# Patient Record
Sex: Male | Born: 1935 | Race: White | Hispanic: No
Health system: Midwestern US, Community
[De-identification: ages and names within clinical notes are randomized; demographics above are authoritative.]

## PROBLEM LIST (undated history)

## (undated) DIAGNOSIS — N401 Enlarged prostate with lower urinary tract symptoms: Secondary | ICD-10-CM

## (undated) DIAGNOSIS — N390 Urinary tract infection, site not specified: Secondary | ICD-10-CM

## (undated) DIAGNOSIS — W19XXXD Unspecified fall, subsequent encounter: Secondary | ICD-10-CM

## (undated) DIAGNOSIS — I4891 Unspecified atrial fibrillation: Secondary | ICD-10-CM

## (undated) DIAGNOSIS — Z950 Presence of cardiac pacemaker: Secondary | ICD-10-CM

## (undated) HISTORY — DX: Unspecified atrial fibrillation: I48.91

## (undated) HISTORY — PX: PACEMAKER INSERTION: SHX728

## (undated) HISTORY — DX: Presence of cardiac pacemaker: Z95.0

---

## 2009-05-25 NOTE — ED Notes (Signed)
Skin tears to elbow cleansed and non adherant dressing applied with connform wrap

## 2009-05-25 NOTE — ED Notes (Signed)
Pt given discharge instructions. Verbalized understanding. Opportunity given for questions. Accompanied by family

## 2009-05-25 NOTE — ED Provider Notes (Signed)
Patient is a 76 y.o. male presenting with fall. The history is provided by the patient.   Fall  The accident occurred 1 to 2 hours ago. The fall occurred while walking (up stairs). He fell from a height of 3 - 5 ft. He landed on concrete. The volume of blood lost was minimal. The point of impact was the head and left elbow (back). The pain is at a severity of 1/10. The pain is mild. He was ambulatory at the scene. There was no entrapment after the fall. There was no drug use involved in the accident. There was alcohol use involved in the accident. Associated symptoms include laceration. Pertinent negatives include no visual change, no fever, no numbness, no abdominal pain, no bowel incontinence, no nausea, no vomiting, no hematuria, no extremity weakness, no hearing loss, no loss of consciousness and no tingling. The risk factors include being elderly.  He has tried nothing for the symptoms. The patient's last tetanus shot was 5 to 10 years ago.       No past medical history on file.     No past surgical history on file.      No family history on file.     History   Social History   ??? Marital Status: Single     Spouse Name: N/A     Number of Children: N/A   ??? Years of Education: N/A   Occupational History   ??? Not on file.   Social History Main Topics   ??? Smoking status: Former Smoker   ??? Smokeless tobacco: Not on file   ??? Alcohol Use: 1.5 oz/week     3 Glasses of wine per week   ??? Drug Use:    ??? Sexually Active:    Other Topics Concern   ??? Not on file   Social History Narrative   ??? No narrative on file           ALLERGIES: Review of patient's allergies indicates no known allergies.      Review of Systems   Constitutional: Negative for fever, chills, diaphoresis, appetite change and fatigue.   Eyes: Negative.    Respiratory: Negative.    Gastrointestinal: Negative for nausea, vomiting and abdominal pain.   Genitourinary: Negative.  Negative for hematuria.   Musculoskeletal: Negative for extremity weakness.    Neurological: Negative for tingling, loss of consciousness and numbness.       Filed Vitals:    05/25/09 1917 05/25/09 1918   BP: 150/96    Pulse: 101    Temp: 96.4 ??F (35.8 ??C)    Resp: 18    Height:  5\' 8"  (1.727 m)   Weight:  170 lb (77.111 kg)              Physical Exam   Nursing note and vitals reviewed.  Constitutional: He is oriented to person, place, and time. He appears well-developed and well-nourished.   HENT:   Head: Normocephalic.        Right Ear: External ear normal.   Left Ear: External ear normal.   Mouth/Throat: Oropharynx is clear and moist. No oropharyngeal exudate.   Eyes: Conjunctivae and extraocular motions are normal. Pupils are equal, round, and reactive to light. Right eye exhibits no discharge. Left eye exhibits no discharge. No scleral icterus.   Neck: Normal range of motion. Neck supple.   Cardiovascular: Normal rate, regular rhythm, normal heart sounds and intact distal pulses.    No murmur heard.  Pulmonary/Chest:  Effort normal and breath sounds normal. No respiratory distress. He has no wheezes. He has no rales.   Abdominal: Soft. He exhibits no distension. No tenderness.   Musculoskeletal: Normal range of motion. He exhibits no edema and no tenderness.   Neurological: He is alert and oriented to person, place, and time. No cranial nerve deficit. Coordination normal.   Skin: Skin is warm. Laceration noted. No rash noted. No erythema.        Skin tears to left elbow noted   Psychiatric: He has a normal mood and affect. His behavior is normal. Judgment and thought content normal.        MDM    Procedures

## 2009-05-25 NOTE — ED Notes (Signed)
Pt fell while going up steps and slidded down the steps, abrasions to left knee, skin tear to left elbow and abrasions to back.  Pt had a couple glasses of wine tonight.

## 2009-05-25 NOTE — ED Provider Notes (Signed)
I have personally seen and evaluated patient. I find the patient's history and physical exam are consistent with the PA's NP documentation. I agree with the care provided, treatments rendered, disposition and follow up plan.

## 2009-05-26 LAB — METABOLIC PANEL, COMPREHENSIVE
A-G Ratio: 0.9 — ABNORMAL LOW (ref 1.1–2.2)
ALT (SGPT): 78 U/L (ref 12–78)
AST (SGOT): 94 U/L — ABNORMAL HIGH (ref 15–37)
Albumin: 3.7 g/dL (ref 3.5–5.0)
Alk. phosphatase: 122 U/L (ref 50–136)
Anion gap: 11 mmol/L (ref 5–15)
BUN/Creatinine ratio: 8 — ABNORMAL LOW (ref 12–20)
BUN: 7 MG/DL (ref 6–20)
Bilirubin, total: 0.6 MG/DL (ref 0.2–1.0)
CO2: 27 MMOL/L (ref 21–32)
Calcium: 8.6 MG/DL (ref 8.5–10.1)
Chloride: 103 MMOL/L (ref 97–108)
Creatinine: 0.9 MG/DL (ref 0.6–1.3)
GFR est AA: >60 mL/min/{1.73_m2} (ref >60)
GFR est non-AA: >60 mL/min/{1.73_m2} (ref >60)
Globulin: 3.9 g/dL (ref 2.0–4.0)
Glucose: 91 MG/DL (ref 65–100)
Potassium: 4.7 MMOL/L (ref 3.5–5.1)
Protein, total: 7.6 g/dL (ref 6.4–8.2)
Sodium: 141 MMOL/L (ref 136–145)

## 2009-05-26 LAB — CBC WITH AUTOMATED DIFF
ABS. BASOPHILS: 0 10*3/uL (ref 0.0–0.1)
ABS. EOSINOPHILS: 0.1 10*3/uL (ref 0.0–0.4)
ABS. LYMPHOCYTES: 2.3 10*3/uL (ref 0.8–3.5)
ABS. MONOCYTES: 0.4 10*3/uL (ref 0.0–1.0)
ABS. NEUTROPHILS: 3.3 10*3/uL (ref 1.8–8.0)
BASOPHILS: 0 % (ref 0–1)
EOSINOPHILS: 1 % (ref 0–7)
HCT: 44.6 % (ref 36.6–50.3)
HGB: 15.9 g/dL (ref 12.1–17.0)
LYMPHOCYTES: 38 % (ref 12–49)
MCH: 38.9 PG — ABNORMAL HIGH (ref 26.0–34.0)
MCHC: 35.7 g/dL (ref 30.0–36.5)
MCV: 109 FL — ABNORMAL HIGH (ref 80.0–99.0)
MONOCYTES: 7 % (ref 5–13)
NEUTROPHILS: 54 % (ref 32–75)
PLATELET: 188 10*3/uL (ref 150–400)
RBC: 4.09 M/uL — ABNORMAL LOW (ref 4.10–5.70)
RDW: 14.1 % (ref 11.5–14.5)
WBC: 6.1 10*3/uL (ref 4.1–11.1)

## 2009-05-26 LAB — ETHYL ALCOHOL: ALCOHOL(ETHYL),SERUM: 186 MG/DL — ABNORMAL HIGH (ref <10)

## 2009-05-26 MED ADMIN — tetanus-diphtheria toxids-td 5-2 Lf unit/0.5 mL injection 0.5 mL: INTRAMUSCULAR | NDC 49281029110

## 2009-05-26 MED FILL — DECAVAC (PF) 5 LF UNIT-2 LF UNIT/0.5 ML INTRAMUSCULAR SYRINGE: 5-2 Lf unit/0.5 mL | INTRAMUSCULAR | Qty: 0.5

## 2011-11-23 NOTE — ED Notes (Signed)
Patient continues to have diarrhea, pt still wants to be discharged home per PA, had the choice to be admitted.

## 2011-11-23 NOTE — ED Provider Notes (Signed)
HPI Comments: Patient presents by EMS after fall. Per family patient was found on floor after falling 20 hours prior. Patient reports he tripped in his bathroom. Patient denies any pain. PAtient with history of drinking alcohol daily. Last drink Wednesday.     Patient is a 78 y.o. male presenting with fatigue and fall. The history is provided by the patient.   Fatigue  This is a recurrent problem. The problem has not changed since onset.There was no focality noted. Primary symptoms include loss of balance.Pertinent negatives include no focal weakness, no loss of sensation, no slurred speech, no speech difficulty, no memory loss, no movement disorder, no agitation, no visual change, no auditory change, no mental status change, no unresponsiveness and no disorientation. There has been no fever. Pertinent negatives include no shortness of breath, no chest pain, no vomiting, no altered mental status, no confusion, no headaches, no choking, no nausea, no bowel incontinence and no bladder incontinence. Associated medical issues do not include trauma, mood changes, bleeding disorder, seizures, dementia, CVA or clotting disorder.   Fall  The accident occurred 12 to 24 hours ago. The fall occurred while standing. He fell from a height of ground level. There was no blood loss. He was not ambulatory at the scene. There was no entrapment after the fall. There was no drug use involved in the accident. There was alcohol use involved in the accident. Pertinent negatives include no visual change, no fever, no numbness, no bowel incontinence, no nausea, no vomiting, no hematuria, no headaches, no extremity weakness, no loss of consciousness, no tingling and no laceration. The risk factors include recurrent falls and being elderly.  He has tried nothing for the symptoms.        No past medical history on file.     No past surgical history on file.      No family history on file.     History     Social History   ??? Marital Status:  SINGLE     Spouse Name: N/A     Number of Children: N/A   ??? Years of Education: N/A     Occupational History   ??? Not on file.     Social History Main Topics   ??? Smoking status: Former Smoker   ??? Smokeless tobacco: Not on file   ??? Alcohol Use: 0.0 oz/week      Comment: about pint of vodka per day grandson   ??? Drug Use: No   ??? Sexually Active: Not on file     Other Topics Concern   ??? Not on file     Social History Narrative   ??? No narrative on file                  ALLERGIES: Review of patient's allergies indicates no known allergies.      Review of Systems   Constitutional: Positive for fatigue. Negative for fever.   HENT: Negative.    Eyes: Negative.    Respiratory: Negative.  Negative for choking and shortness of breath.    Cardiovascular: Negative.  Negative for chest pain.   Gastrointestinal: Negative.  Negative for nausea, vomiting and bowel incontinence.   Endocrine: Negative.    Genitourinary: Negative.  Negative for bladder incontinence and hematuria.   Musculoskeletal: Negative.  Negative for extremity weakness.   Skin: Negative.    Allergic/Immunologic: Negative.    Neurological: Positive for loss of balance. Negative for tingling, focal weakness, loss of consciousness, speech difficulty,  numbness and headaches.   Hematological: Negative.    Psychiatric/Behavioral: Negative for memory loss, confusion, agitation and altered mental status. The patient is nervous/anxious.    All other systems reviewed and are negative.        There were no vitals filed for this visit.         Physical Exam   Nursing note and vitals reviewed.  Constitutional: He is oriented to person, place, and time. He appears well-developed and well-nourished.   HENT:   Head: Normocephalic and atraumatic.   Right Ear: External ear normal.   Left Ear: External ear normal.   Mouth/Throat: Oropharynx is clear and moist. No oropharyngeal exudate.   Eyes: Conjunctivae and EOM are normal. Pupils are equal, round, and reactive to light. Right eye  exhibits no discharge. Left eye exhibits no discharge. No scleral icterus.   Neck: Normal range of motion. Neck supple. No tracheal deviation present. No thyromegaly present.   Cardiovascular: Normal rate, regular rhythm, normal heart sounds and intact distal pulses.    No murmur heard.  Pulmonary/Chest: Effort normal and breath sounds normal. No respiratory distress. He has no wheezes. He has no rales.   Abdominal: Soft. Bowel sounds are normal. He exhibits no distension. There is no tenderness. There is no rebound and no guarding.   Musculoskeletal: Normal range of motion. He exhibits no edema and no tenderness.   Lymphadenopathy:     He has no cervical adenopathy.   Neurological: He is alert and oriented to person, place, and time. No cranial nerve deficit. Coordination normal.   Skin: Skin is warm. No laceration and no rash noted. No erythema.   Psychiatric: His behavior is normal. Judgment and thought content normal. His mood appears anxious. Cognition and memory are normal.        MDM    Procedures

## 2011-11-23 NOTE — ED Notes (Signed)
Patient to CT>

## 2011-11-23 NOTE — ED Notes (Signed)
Patient in wheelchair home with daughter. Patient given discharge instruction and verbalized understanding of teaching. Armband removed and shredded.

## 2011-11-23 NOTE — ED Provider Notes (Signed)
I have personally seen and evaluated patient. I find the patient's history and physical exam are consistent with the PA's NP documentation. I agree with the care provided, treatments rendered, disposition and follow up plan.

## 2011-11-23 NOTE — ED Notes (Signed)
Patient to receive IV fluids and then go home.

## 2011-11-23 NOTE — ED Notes (Signed)
Patient presents via EMS after ground level fall, on floor for about 20 - 23 hours.  Denies pain but family wants him checked out.  Has been gradually weaker for past few days.  Does not normally eat very well, does not do much cooking.  Grandson told EMS patient drinks about pint of vodka per day.  Last drink Wednesday per patient.  Patient has shakes.

## 2011-11-23 NOTE — ED Notes (Signed)
Seizure pads placed on bed.

## 2011-11-23 NOTE — ED Notes (Signed)
Patient attempting to use urinal at this time.

## 2011-11-24 LAB — URINALYSIS W/ REFLEX CULTURE
Bacteria: NEGATIVE /HPF
Glucose: NEGATIVE MG/DL
Ketone: 15 MG/DL — AB
Leukocyte Esterase: NEGATIVE
Nitrites: NEGATIVE
Specific gravity: 1.018 (ref 1.003–1.030)
Urobilinogen: 1 EU/DL (ref 0.2–1.0)
pH (UA): 5.5 (ref 5.0–8.0)

## 2011-11-24 LAB — CBC WITH AUTOMATED DIFF
ABS. BASOPHILS: 0 10*3/uL (ref 0.0–0.1)
ABS. EOSINOPHILS: 0 10*3/uL (ref 0.0–0.4)
ABS. LYMPHOCYTES: 1.3 10*3/uL (ref 0.8–3.5)
ABS. MONOCYTES: 0.7 10*3/uL (ref 0.0–1.0)
ABS. NEUTROPHILS: 6.5 10*3/uL (ref 1.8–8.0)
BASOPHILS: 0 % (ref 0–1)
EOSINOPHILS: 0 % (ref 0–7)
HCT: 38.7 % (ref 36.6–50.3)
HGB: 14 g/dL (ref 12.1–17.0)
LYMPHOCYTES: 15 % (ref 12–49)
MCH: 41.5 PG — ABNORMAL HIGH (ref 26.0–34.0)
MCHC: 36.2 g/dL (ref 30.0–36.5)
MCV: 114.8 FL — ABNORMAL HIGH (ref 80.0–99.0)
MONOCYTES: 8 % (ref 5–13)
NEUTROPHILS: 77 % — ABNORMAL HIGH (ref 32–75)
PLATELET: 168 10*3/uL (ref 150–400)
RBC: 3.37 M/uL — ABNORMAL LOW (ref 4.10–5.70)
RDW: 14.9 % — ABNORMAL HIGH (ref 11.5–14.5)
WBC COMMENTS: REACTIVE
WBC: 8.5 10*3/uL (ref 4.1–11.1)

## 2011-11-24 LAB — METABOLIC PANEL, COMPREHENSIVE
A-G Ratio: 1.1 (ref 1.1–2.2)
ALT (SGPT): 67 U/L (ref 12–78)
AST (SGOT): 106 U/L — ABNORMAL HIGH (ref 15–37)
Albumin: 3.8 g/dL (ref 3.5–5.0)
Alk. phosphatase: 131 U/L (ref 50–136)
Anion gap: 12 mmol/L (ref 5–15)
BUN/Creatinine ratio: 12 (ref 12–20)
BUN: 9 MG/DL (ref 6–20)
Bilirubin, total: 1.7 MG/DL — ABNORMAL HIGH (ref 0.2–1.0)
CO2: 28 MMOL/L (ref 21–32)
Calcium: 8.6 MG/DL (ref 8.5–10.1)
Chloride: 103 MMOL/L (ref 97–108)
Creatinine: 0.73 MG/DL (ref 0.45–1.15)
GFR est AA: >60 mL/min/{1.73_m2} (ref >60)
GFR est non-AA: >60 mL/min/{1.73_m2} (ref >60)
Globulin: 3.4 g/dL (ref 2.0–4.0)
Glucose: 97 MG/DL (ref 65–100)
Potassium: 4.4 MMOL/L (ref 3.5–5.1)
Protein, total: 7.2 g/dL (ref 6.4–8.2)
Sodium: 143 MMOL/L (ref 136–145)

## 2011-11-24 LAB — CK: CK: 104 U/L (ref 39–308)

## 2011-11-24 MED ADMIN — diphenoxylate-atropine (LOMOTIL) tablet 2 Tab: ORAL | @ 02:00:00 | NDC 00378041501

## 2011-11-24 MED ADMIN — sodium chloride 0.9 % bolus infusion 1,000 mL: INTRAVENOUS | @ 01:00:00 | NDC 00409798309

## 2011-11-24 MED ADMIN — sodium chloride (NS) flush 5-10 mL: INTRAVENOUS | @ 02:00:00 | NDC 87701099893

## 2011-11-24 MED ADMIN — LORazepam (ATIVAN) injection 1 mg: INTRAVENOUS | @ 01:00:00 | NDC 00641604401

## 2011-11-24 MED FILL — SODIUM CHLORIDE 0.9 % IV: INTRAVENOUS | Qty: 1000

## 2011-11-24 MED FILL — DIPHENOXYLATE-ATROPINE 2.5 MG-0.025 MG TAB: ORAL | Qty: 2

## 2011-11-24 MED FILL — LORAZEPAM 2 MG/ML IJ SOLN: 2 mg/mL | INTRAMUSCULAR | Qty: 1

## 2011-11-25 LAB — EKG, 12 LEAD, INITIAL
Atrial Rate: 82 {beats}/min
Calculated R Axis: -49 degrees
Calculated T Axis: 129 degrees
Q-T Interval: 418 ms
QRS Duration: 136 ms
QTC Calculation (Bezet): 528 ms
Ventricular Rate: 96 {beats}/min

## 2011-11-27 NOTE — Patient Instructions (Addendum)
If you drink alcohol, try to limit intake to no more than 1 beer (or one glass of wine or one shot of liquor) in any 24 hour period, and not daily.     obtain physical therapy    Talk to Vickie our nurse navigator to see what resources are available (they wish home health for physical therapy and want inpatient alcohol treatment)    See Dr. Guss Bunde for psychiatric evaluation    Take centrum silver vitamin daily    Take thiamine 100 mg daily    Labs today

## 2011-11-27 NOTE — Progress Notes (Signed)
Dylan Haney is a 78 y.o. male      Issues discussed today include:    New patient    Signs and symptoms:  Fall.  10/26  Duration:  [place] to the [place], stumbled and fell.  He had had 3-4 drinks (Vodka)Taken by EMS to ER when he couldn't get up  Context:  went to [facility] ER   Location:  Has had several falls over the years  Quality:  Walks with cane  Severity:  Drinks daily  Timing:  Fall risk  Modifying factors:  Wife died 5 years ago    He drinks ETOH daily    Both sons alcoholic and have been through detox    He does have hip OA    He lives alone.      Data reviewed or ordered today:  labs    Other problems include:  Patient Active Problem List   Diagnosis Code   ??? Falls E888.9   ??? ETOH abuse 305.00   ??? A-fib 427.31       Medications:  No current outpatient prescriptions on file.     No current facility-administered medications for this visit.       Allergies:  No Known Allergies    LMP:  No LMP for male patient.    History     Social History   ??? Marital Status: WIDOWED     Spouse Name: N/A     Number of Children: N/A   ??? Years of Education: N/A     Occupational History   ??? Not on file.     Social History Main Topics   ??? Smoking status: Former Smoker   ??? Smokeless tobacco: Not on file   ??? Alcohol Use: 0.0 oz/week      Comment: about pint of vodka per day grandson   ??? Drug Use: No   ??? Sexually Active: Not on file     Other Topics Concern   ??? Not on file     Social History Narrative   ??? No narrative on file       No family history on file.      Meaningful use:  done      ROS:  Headaches:  no  Chest Pain:  no  SOB:  no  Fevers:  no  Other significant ROS:  Deaf left ear    No LMP for male patient.    Physical Exam  BP 130/78   Pulse 98   Temp(Src) 97.4 ??F (36.3 ??C) (Oral)   Resp 20   Ht 5\' 8"  (1.727 m)   Wt 157 lb (71.215 kg)   BMI 23.88 kg/m2  BP Readings from Last 3 Encounters:   11/27/11 130/78   11/23/11 133/71   05/25/09 150/96     Constitutional:  Appears well,  No Acute Distress, Vitals noted  Psychiatric:   Affect  normal, Alert and Oriented to person/place/time    Eyes:   Pupils equally round and reactive, EOMI, conjunctiva clear, eyelids normal  ENT:   External ears and nose normal/lips, teeth=dentures OK/gums normal, TMs and Orophyarynx normal  Neck:   general inspection and Thyroid normal.  No abnormal cervical or supraclavicular nodes    Lungs:   clear to auscultation, good respiratory effort  Heart:   Ausculation normal.  Regular rhythm.  No cardiac murmurs.  No carotid bruits or palpable thrills  Chest wall normal    Extremities:   without edema, good peripheral pulses  Skin:  Warm to palpation, without rashes, bruising, or suspicious lesions           Assessment:    Patient Active Problem List   Diagnosis Code   ??? Falls E888.9   ??? ETOH abuse 305.00   ??? A-fib 427.31       Today's diagnoses are:  1. Falls     2. ETOH abuse  REFERRAL TO PSYCHIATRY   3. A-fib  REFERRAL TO CARDIOLOGY   4. Depression  REFERRAL TO PSYCHIATRY       Plan:  Orders Placed This Encounter   ??? REFERRAL TO CARDIOLOGY     Referral Priority:  Routine     Referral Type:  Consultation     Referral Reason:  Specialty Services Required     Referred to Provider:  Laddie Aquas, MD     Requested Specialty:  Cardiology     Number of Visits Requested:  1   ??? REFERRAL TO PSYCHIATRY     Referral Priority:  Routine     Referral Type:  Behavioral Health     Referral Reason:  Specialty Services Required     Referred to Provider:  Lawanna Kobus, MD     Requested Specialty:  Psychiatry     Number of Visits Requested:  1       See patient instructions  Patient Instructions   If you drink alcohol, try to limit intake to no more than 1 beer (or one glass of wine or one shot of liquor) in any 24 hour period, and not daily.     obtain physical therapy    Talk to Vickie our nurse navigator to see what resources are available (they wish home health for physical therapy and want inpatient alcohol treatment)    See Dr. Guss Bunde for psychiatric evaluation        MYCHART:  info given     refresh note:  done    AVS Printed:  done

## 2011-11-27 NOTE — Progress Notes (Signed)
Chief Complaint   Patient presents with   ??? New Patient     Patient declined influenza vaccine.     Reviewed record in preparation for visit and have obtained necessary documentation.

## 2011-11-28 LAB — VITAMIN D, 25 HYDROXY: VITAMIN D, 25-HYDROXY: 11.1 ng/mL — ABNORMAL LOW (ref 30.0–100.0)

## 2011-11-28 LAB — HEPATIC FUNCTION PANEL
ALT (SGPT): 43 IU/L (ref 0–44)
AST (SGOT): 67 IU/L — ABNORMAL HIGH (ref 0–40)
Albumin: 3.9 g/dL (ref 3.5–4.7)
Alk. phosphatase: 85 IU/L (ref 44–105)
Bilirubin, direct: 0.66 mg/dL — ABNORMAL HIGH (ref 0.00–0.40)
Bilirubin, total: 1.4 mg/dL — ABNORMAL HIGH (ref 0.0–1.2)
Protein, total: 6.6 g/dL (ref 6.0–8.5)

## 2011-11-28 LAB — VITAMIN B12 & FOLATE
Folate: 6.5 ng/mL (ref >3.0)
Vitamin B12: 312 pg/mL (ref 211–946)

## 2011-11-28 NOTE — Progress Notes (Signed)
Called to see if pt would meet criteria for [facility].  He said he does not drive anymore and does not get out of his home.  Also called [facility] psych to see if they do detox or alcohol rehab.  The do not do alcohol rehab but they can do detox if pt meets qualifications.  Was told to have pt go to ER and let them know what was needed and they would be evaluated and treated as needed.    Pt wanted me to talk to his daughter so I called her.  She said she wanted [facility] and she might take him to [facility] but she would think about it.

## 2011-11-28 NOTE — Progress Notes (Signed)
Quick Note:    Your vitamin D is VERY low.    Your liver is still irritated.    Please follow up to discuss.  ______

## 2011-11-29 NOTE — Progress Notes (Signed)
[facility] does not accept his insurance so they suggested [facility].  Referral request faxed to them.

## 2011-11-30 NOTE — Progress Notes (Signed)
Daughter called and said that Dr Bing Ree office will not accept her fathers insurance for psych so she is going to check a few other places.  She will call me if she has any luck.

## 2011-12-11 NOTE — Patient Instructions (Addendum)
Take Centrum silver vitamin daily    Obtain liver ultrasound    Please call [name] to help arrange and authorize your tests and/or referrals.  Her # is [phone]     Take vitamin D:  One pill daily for 3 days then one pill once a week for 12 weeks    Recheck labs in 3 months    Avoid alcohol    Flu shot soon

## 2011-12-11 NOTE — Progress Notes (Signed)
Dylan Haney is a 78 y.o.  male  Issues discussed today include:    Follow up of chronic conditions, see diagnoses    Vit D low, liver irritated    Now off ETOH since Halloween.  No DTs    Data reviewed or ordered today:  Needs further labs and abd [place]    Other problems include:  Patient Active Problem List   Diagnosis Code   ??? Falls E888.9   ??? ETOH abuse 305.00   ??? A-fib 427.31   ??? Elevated bilirubin 782.4   ??? Unspecified vitamin D deficiency 268.9   ??? Macrocytosis 289.89       Medications:  Current Outpatient Prescriptions   Medication Sig Dispense Refill   ??? aspirin 81 mg chewable tablet Take 1 Tab by mouth daily.  90 Tab  3   ??? thiamine (B-1) 100 mg tablet Take 1 Tab by mouth daily.  30 Tab  11     No current facility-administered medications for this visit.       Allergies:  No Known Allergies    LMP:  No LMP for male patient.    History     Social History   ??? Marital Status: WIDOWED     Spouse Name: N/A     Number of Children: N/A   ??? Years of Education: N/A     Occupational History   ??? Not on file.     Social History Main Topics   ??? Smoking status: Former Smoker   ??? Smokeless tobacco: Never Used   ??? Alcohol Use: 0.0 oz/week      Comment: about pint of vodka per day grandson   ??? Drug Use: No   ??? Sexually Active: Not on file     Other Topics Concern   ??? Not on file     Social History Narrative   ??? No narrative on file       No family history on file.    Meaningful use:  done      Physical Exam:  BP 132/78   Pulse 64   Temp(Src) 97 ??F (36.1 ??C) (Oral)   Resp 20   Ht 5\' 8"  (1.727 m)   Wt 150 lb 3.2 oz (68.13 kg)   BMI 22.84 kg/m2    BP Readings from Last 3 Encounters:   12/11/11 132/78   11/27/11 130/78   11/23/11 133/71         Assessment:    Patient Active Problem List   Diagnosis Code   ??? Falls E888.9   ??? ETOH abuse 305.00   ??? A-fib 427.31   ??? Elevated bilirubin 782.4   ??? Unspecified vitamin D deficiency 268.9   ??? Macrocytosis 289.89       Today's diagnoses are:  1. Unspecified vitamin D deficiency  VITAMIN  D, 25 HYDROXY   2. Transaminitis  [place] ABD COMP, HEPATITIS PANEL, ACUTE, CBC W/O DIFF, HEPATIC FUNCTION PANEL   3. Need for pneumococcal vaccination  PNEUMOCOCCAL POLYSACCHARIDE VACCINE, 23-VALENT, ADULT OR IMMUNOSUPPRESSED PT DOSE,       Plan:  Orders Placed This Encounter   ??? [place] ABD COMP     Standing Status: Future      Number of Occurrences:       Standing Expiration Date: 01/09/2013     Order Specific Question:  Reason for Exam     Answer:  transaminitis   ??? PNEUMOCOCCAL POLYSACCHARIDE VACCINE, 23-VALENT, ADULT OR IMMUNOSUPPRESSED PT DOSE,   ???  HEPATITIS PANEL, ACUTE     Standing Status: Future      Number of Occurrences:       Standing Expiration Date: 06/09/2012   ??? VITAMIN D, 25 HYDROXY     Standing Status: Future      Number of Occurrences:       Standing Expiration Date: 06/09/2012   ??? CBC W/O DIFF     Standing Status: Future      Number of Occurrences:       Standing Expiration Date: 06/09/2012   ??? HEPATIC FUNCTION PANEL     Standing Status: Future      Number of Occurrences:       Standing Expiration Date: 06/09/2012       See patient instructions  Patient Instructions   Take Centrum silver vitamin daily    Obtain liver ultrasound    Please call Dylan Haney to help arrange and authorize your tests and/or referrals.  Her # is [phone]     Take vitamin D:  One pill daily for 3 days then one pill once a week for 12 weeks    Recheck labs in 3 months    Avoid alcohol    Flu shot soon        MYCHART:  info given    refresh note:  done    AVS Printed:  done      The majority of today's visit was counseling or coordination of care 15 minutes.     Total face to face time:  15  Time spent counseling:  15  Subject:  See diagnoses.

## 2011-12-11 NOTE — Progress Notes (Signed)
Chief Complaint   Patient presents with   ??? Abnormal Lab Results     follow up on labs     Reviewed record in preparation for visit and have obtained necessary documentation.

## 2011-12-17 NOTE — Telephone Encounter (Signed)
PT called wanting to double check his appt for tomorrow, states it is at 9 am at [facility] for an ultrasound. Would like to be given a call back as soon as possible.    Thank You

## 2011-12-19 NOTE — Progress Notes (Signed)
Quick Note:    Your ultrasound shows fatty infiltration of the liver. Please follow up to discuss.  ______

## 2012-02-12 NOTE — Progress Notes (Signed)
Chief Complaint   Patient presents with   ??? Follow-up     falls, etoh     Says he hasn't had anything to drink in over a month.   Is not eating. He doesn't cook and can't drive to get food.     Reviewed record in preparation for visit and have obtained necessary documentation.

## 2012-02-12 NOTE — Addendum Note (Signed)
Addended by: Marciano Sequin C on: 02/12/2012 02:59 PM     Modules accepted: Orders

## 2012-02-12 NOTE — Patient Instructions (Addendum)
Instructions for patient:    In general, I advise patients to be as active as possible.  I believe exercise is the key to long life and good health.  The current recommendation is for individuals to exercise for 150 minutes each week (in other words 30 minutes 5 days a week).  Exercise should be vigorous enough to work up a sweat.  These activities include brisk walking, running, tennis, swimming, weight-lifting, etc.     I usually tell folks that work is work and exercise is exercise.  Each of these activities has a different goal.  Even though you may be active at work, it may not be aerobically adequate.  So build dedicated exercise time into your weekly routine.    For reliable diet information go to www.[employer].fi.   You may also wish to talk to the nutritionist at [facility]  [phone] or [phone]      In general, a healthful diet includes:  1. more fruits and vegetables (grown locally, organic if possible)  2. keep meat to one meal a day (not all three meals)  3. keep meat portion size to equal a deck of cards  4. eat more greens and beans and foods with natural color (like blueberries)  5. eat whole foods (think ???what would a cave-man eat????, certainly not hotdogs!)  6. drink plenty of water  7. consider sodas and sugary juices as treats (think ???liquid candy???)    I advise against alcohol consumption.    Avoid tobacco products.    I routinely suggest a complete physical exam once each year (your birth month).    I suggest flu shot but we are out.  I suggest that you go to a local pharmacy to obtain one this season.    I cautiously agree to day driving.  Stop if you have any problems operating your vehicle safely.    See surgeon about hernia

## 2012-02-12 NOTE — Progress Notes (Signed)
Dylan Haney is a 79 y.o.  male  Issues discussed today include:    Follow up of chronic conditions, see diagnoses    CPx    No drinking since October.  No further falls.  He is doing much better per his [employer].  He is missing meals though due to transportation issues.  i will allow him to drive during the day for now.  Avoid ETOH.  Consider driving test if he has problems.    Health Maintenance    Colonoscopy:  Age 37 normal per patient, none since  Glaucoma screen:  needs  Tdap:  2011  Pneumovax:  2013  Flu shot:  needs  EKG documentation: 2013      Data reviewed or ordered today:  labs    Other problems include:  Patient Active Problem List   Diagnosis Code   ??? Falls E888.9   ??? ETOH abuse 305.00   ??? A-fib 427.31   ??? Elevated bilirubin 782.4   ??? Unspecified vitamin D deficiency 268.9   ??? Macrocytosis 289.89   ??? NASH (nonalcoholic steatohepatitis) 571.8       Medications:  Current Outpatient Prescriptions   Medication Sig Dispense Refill   ??? cholecalciferol (VITAMIN D3) 1,000 unit tablet Take  by mouth daily.       ??? aspirin 81 mg chewable tablet Take 1 Tab by mouth daily.  90 Tab  3   ??? thiamine (B-1) 100 mg tablet Take 1 Tab by mouth daily.  30 Tab  11     No current facility-administered medications for this visit.       Allergies:  No Known Allergies    LMP:  No LMP for male patient.    History     Social History   ??? Marital Status: WIDOWED     Spouse Name: N/A     Number of Children: N/A   ??? Years of Education: N/A     Occupational History   ??? Not on file.     Social History Main Topics   ??? Smoking status: Former Smoker   ??? Smokeless tobacco: Never Used   ??? Alcohol Use: 0.0 oz/week      Comment: about pint of vodka per day grandson   ??? Drug Use: No   ??? Sexually Active: Not on file     Other Topics Concern   ??? Not on file     Social History Narrative   ??? No narrative on file       No family history on file.    Meaningful use:  done    Review of Systems    Patient denies problems with    Headaches,  Chest  pain, Shortness of breath, fevers, Hearing/vision, speaking/swallowing, Reflux/indigestion, Cough,Diarrhea/constipation,Problems passing or controlling urine,  Mood (anxiety/depression), Snoring/sleep,Fatigue, Weight change/Appetite                                                            Any Positive ROS include: RIH,  +prostatism, left ear pain       Falls in the past 12 months:  None since 10/2011 when he stopped ETOH           Exercise:  active  No LMP for male patient.    Physical Exam  BP 134/79   Pulse 94   Temp(Src) 97.6 ??F (36.4 ??C) (Oral)   Resp 16   Ht 5\' 8"  (1.727 m)   Wt 148 lb (67.132 kg)   BMI 22.51 kg/m2  BP Readings from Last 3 Encounters:   02/12/12 134/79   12/11/11 132/78   11/27/11 130/78       Constitutional:  Appears well,  No Acute Distress, Vitals noted  Psychiatric:   Affect normal, Alert and Oriented to person/place/time    Eyes:   Pupils equally round and reactive, EOMI, conjunctiva clear, eyelids normal  ENT:   External ears and nose normal/lips, teeth=dentures lower only OK/gums normal, TM is red on left, right Tm has some wax but is not occluded and Orophyarynx normal  Neck:   general inspection and Thyroid normal.  No abnormal cervical or supraclavicular nodes    Lungs:   clear to auscultation, good respiratory effort  Heart:   Ausculation normal.  Regular rhythm.  No cardiac murmurs.  No carotid bruits or palpable thrills  Chest wall normal    Abdominal exam:   normal.  Liver and spleen normal.  No bruits/masses/tenderness    GU:  Both testicles descended, large RIHernia, prostate normal, Heme negative  Extremities:   without edema, good peripheral pulses  Skin:   Warm to palpation, without rashes, bruising, or suspicious lesions     Neuro:  No facial droop, speech fluent, EOMI, Pupils equally round and reactive to light, visual fields seem OK, hearing seems normal and symmetrical,smile symmetrical, puffs out cheeks symmetrically    Shoulder shrug  symmetrical     moves all extremities, strength/sensation seem intact and symmetrical    Rapid alternating movements of hands normal and symmetrical    balance seems OK, no pronator drift, gait pretty normal. "get up and go" test OK    squats OK, heel standing/toe standing OK    no tenderness of C spine, T spine, LS spine, flexion/extension of spine OK    Affect seems appropriate, no obvious mental processing problems    Joint Exam    Ankles   full range of motion at both ankle joints   no joint effusion or pain   no evidence of joint instability    Knees   full range of motion at both knee joints   no joint effusion or pain   no evidence of joint instability    Hips   full range of motion (ROM) at both hip joints   no pain on ROM   no evidence of joint instability    Hands   no evidence of osteoarthritic changes   good grip strength   no deformities of hands noted    Elbows   full range of motion at both elbow joints   no joint effusion or pain   no evidence of joint instability   no tenderness over medial lateral epicondyle    Shoulders   full range of motion (ROM) at both shoulder joints   no pain with ROM of  either shoulder   no evidence of joint instability   no impingement signs    Spine   no tenderness over L/S spine,  T-spine,  C-spine   full range of motion of spine with out pain    Bones   palpation over major bones show no pain or obvious deformaties  Assessment:    Patient Active Problem List   Diagnosis Code   ??? Falls E888.9   ??? ETOH abuse 305.00   ??? A-fib 427.31   ??? Elevated bilirubin 782.4   ??? Unspecified vitamin D deficiency 268.9   ??? Macrocytosis 289.89   ??? NASH (nonalcoholic steatohepatitis) 571.8       Today's diagnoses are:  1. Routine general medical examination at a health care facility     2. Otitis media      left     3. Hernia  REFERRAL TO GENERAL SURGERY    RIH   4. ETOH abuse      none since 10/2011, no DTs       Plan:  Orders Placed This Encounter   ??? REFERRAL TO GENERAL  SURGERY     Referral Priority:  Routine     Referral Type:  Consultation     Referral Reason:  Specialty Services Required     Referral Location:  [facility]     Referred to Provider:  Guinevere Ferrari, MD     Requested Specialty:  General Surgery     Number of Visits Requested:  1   ??? cholecalciferol (VITAMIN D3) 1,000 unit tablet     Sig: Take  by mouth daily.       See patient instructions  Patient Instructions       Instructions for patient:    In general, I advise patients to be as active as possible.  I believe exercise is the key to long life and good health.  The current recommendation is for individuals to exercise for 150 minutes each week (in other words 30 minutes 5 days a week).  Exercise should be vigorous enough to work up a sweat.  These activities include brisk walking, running, tennis, swimming, weight-lifting, etc.     I usually tell folks that work is work and exercise is exercise.  Each of these activities has a different goal.  Even though you may be active at work, it may not be aerobically adequate.  So build dedicated exercise time into your weekly routine.    For reliable diet information go to www.[employer].fi.   You may also wish to talk to the nutritionist at [facility]  [phone] or [phone]      In general, a healthful diet includes:  1. more fruits and vegetables (grown locally, organic if possible)  2. keep meat to one meal a day (not all three meals)  3. keep meat portion size to equal a deck of cards  4. eat more greens and beans and foods with natural color (like blueberries)  5. eat whole foods (think ???what would a cave-man eat????, certainly not hotdogs!)  6. drink plenty of water  7. consider sodas and sugary juices as treats (think ???liquid candy???)    I advise against alcohol consumption.    Avoid tobacco products.    I routinely suggest a complete physical exam once each year (your birth month).    I suggest flu shot but we are out.  I suggest  that you go to a local pharmacy to obtain one this season.    I cautiously agree to day driving.  Stop if you have any problems operating your vehicle safely.    See surgeon about hernia                      MYCHART:  info given  refresh note:  done    AVS Printed:  done

## 2012-02-12 NOTE — Progress Notes (Signed)
Left ear redness and otorrhea likely from cerumen removal.  Try vosol HC drops, Rx given

## 2012-02-12 NOTE — Addendum Note (Signed)
Addended by: Marciano Sequin C on: 02/12/2012 03:19 PM     Modules accepted: Orders

## 2012-02-13 LAB — HEPATIC FUNCTION PANEL
ALT (SGPT): 23 IU/L (ref 0–44)
AST (SGOT): 33 IU/L (ref 0–40)
Albumin: 3.6 g/dL (ref 3.5–4.7)
Alk. phosphatase: 74 IU/L (ref 39–117)
Bilirubin, direct: 0.21 mg/dL (ref 0.00–0.40)
Bilirubin, total: 0.4 mg/dL (ref 0.0–1.2)
Protein, total: 6.5 g/dL (ref 6.0–8.5)

## 2012-02-13 LAB — VITAMIN D, 25 HYDROXY: VITAMIN D, 25-HYDROXY: 44.5 ng/mL (ref 30.0–100.0)

## 2012-02-13 LAB — HEPATITIS PANEL, ACUTE
HEP C VIRUS AB: <0.1 s/co ratio (ref 0.0–0.9)
Hep B Core Ab, IgM: NEGATIVE
Hep B surface Ag screen: NEGATIVE
Hepatitis A Ab, IgM: NEGATIVE

## 2012-02-13 LAB — CBC W/O DIFF
HCT: 40.1 % (ref 37.5–51.0)
HGB: 13.6 g/dL (ref 12.6–17.7)
MCH: 33.6 pg — ABNORMAL HIGH (ref 26.6–33.0)
MCHC: 33.9 g/dL (ref 31.5–35.7)
MCV: 99 fL — ABNORMAL HIGH (ref 79–97)
PLATELET: 374 10*3/uL (ref 155–379)
RBC: 4.05 x10E6/uL — ABNORMAL LOW (ref 4.14–5.80)
RDW: 13.4 % (ref 12.3–15.4)
WBC: 7.9 10*3/uL (ref 3.4–10.8)

## 2012-02-13 LAB — PSA, DIAGNOSTIC (PROSTATE SPECIFIC AG): Prostate Specific Ag: 2.2 ng/mL (ref 0.0–4.0)

## 2012-02-18 NOTE — Progress Notes (Signed)
Quick Note:    Tests for hepatitis are normal.    Your vitamin D is better.     Your blood counts are better.    Your PSA is normal.     Everything is getting better off alcohol. Keep it up!!!  ______

## 2012-09-10 ENCOUNTER — Inpatient Hospital Stay
Admit: 2012-09-10 | Discharge: 2012-09-16 | Disposition: A | Discharge status: Skilled Nursing Facility | Payer: MEDICARE | Attending: Family Medicine | Admitting: Family Medicine

## 2012-09-10 DIAGNOSIS — I5022 Chronic systolic (congestive) heart failure: Secondary | ICD-10-CM

## 2012-09-10 LAB — CBC WITH AUTOMATED DIFF
ABS. BASOPHILS: 0 10*3/uL (ref 0.0–0.1)
ABS. EOSINOPHILS: 0 10*3/uL (ref 0.0–0.4)
ABS. LYMPHOCYTES: 1.1 10*3/uL (ref 0.8–3.5)
ABS. MONOCYTES: 0.3 10*3/uL (ref 0.0–1.0)
ABS. NEUTROPHILS: 7.1 10*3/uL (ref 1.8–8.0)
BASOPHILS: 0 % (ref 0–1)
EOSINOPHILS: 0 % (ref 0–7)
HCT: 45.2 % (ref 36.6–50.3)
HGB: 15.6 g/dL (ref 12.1–17.0)
LYMPHOCYTES: 13 % (ref 12–49)
MCH: 38.1 PG — ABNORMAL HIGH (ref 26.0–34.0)
MCHC: 34.5 g/dL (ref 30.0–36.5)
MCV: 110.5 FL — ABNORMAL HIGH (ref 80.0–99.0)
MONOCYTES: 4 % — ABNORMAL LOW (ref 5–13)
NEUTROPHILS: 83 % — ABNORMAL HIGH (ref 32–75)
PLATELET: 258 10*3/uL (ref 150–400)
RBC: 4.09 M/uL — ABNORMAL LOW (ref 4.10–5.70)
RDW: 14 % (ref 11.5–14.5)
WBC: 8.5 10*3/uL (ref 4.1–11.1)

## 2012-09-10 LAB — CK: CK: 472 U/L — ABNORMAL HIGH (ref 39–308)

## 2012-09-10 LAB — GLUCOSE, POC
Glucose (POC): 46 mg/dL — CL (ref 75–110)
Glucose (POC): 47 mg/dL — CL (ref 75–110)
Glucose (POC): 57 mg/dL — ABNORMAL LOW (ref 75–110)
Glucose (POC): 60 mg/dL — ABNORMAL LOW (ref 75–110)
Glucose (POC): 69 mg/dL — ABNORMAL LOW (ref 75–110)
Glucose (POC): 165 mg/dL — ABNORMAL HIGH (ref 75–110)

## 2012-09-10 LAB — METABOLIC PANEL, BASIC
Anion gap: 22 mmol/L — ABNORMAL HIGH (ref 5–15)
BUN/Creatinine ratio: 20 (ref 12–20)
BUN: 20 MG/DL (ref 6–20)
CO2: 19 mmol/L — ABNORMAL LOW (ref 21–32)
Calcium: 9.3 MG/DL (ref 8.5–10.1)
Chloride: 99 mmol/L (ref 97–108)
Creatinine: 0.99 MG/DL (ref 0.45–1.15)
GFR est AA: >60 mL/min/{1.73_m2} (ref >60)
GFR est non-AA: >60 mL/min/{1.73_m2} (ref >60)
Glucose: 92 mg/dL (ref 65–100)
Potassium: 3.9 mmol/L (ref 3.5–5.1)
Sodium: 140 mmol/L (ref 136–145)

## 2012-09-10 MED ADMIN — 0.9% sodium chloride 1,000 mL with mvi, adult no.4 with vit K 10 mL, thiamine 100 mg, folic acid 1 mg infusion: INTRAVENOUS | @ 20:00:00 | NDC 63323018410

## 2012-09-10 MED ADMIN — enoxaparin (LOVENOX) injection 40 mg: SUBCUTANEOUS | @ 20:00:00 | NDC 00075801401

## 2012-09-10 MED ADMIN — sodium chloride (NS) flush 5-10 mL: INTRAVENOUS | @ 19:00:00 | NDC 87701099893

## 2012-09-10 MED ADMIN — diazepam (VALIUM) tablet 20 mg: ORAL | @ 20:00:00 | NDC 62135076747

## 2012-09-10 MED ADMIN — sodium chloride (NS) flush 5-10 mL: INTRAVENOUS | @ 18:00:00 | NDC 87701099893

## 2012-09-10 MED ADMIN — diazepam (VALIUM) tablet 20 mg: ORAL | @ 20:00:00 | NDC 63739007310

## 2012-09-10 MED ADMIN — ibuprofen (MOTRIN) tablet 400 mg: ORAL | NDC 55111068205

## 2012-09-10 NOTE — Progress Notes (Addendum)
Assumed care of pt. Pt was ordered valium 20mg  q 1 hours for a total of 3 doses of 20mg . Called and spoke with family practice who said that, that was the standard loading dose and to go ahead and give the first 20 mg. Pt was given the first 20 mg, nurse has held the rest of doses.TRANSFER - OUT REPORT:    Verbal report given to Chris(name) on Dylan Haney  being transferred to 517(unit) for routine progression of care       Report consisted of patient???s Situation, Background, Assessment and   Recommendations(SBAR).     Information from the following report(s) SBAR, Kardex and ED Summary was reviewed with the receiving nurse.    Opportunity for questions and clarification was provided.      Patient transported with:   [employer]

## 2012-09-10 NOTE — ED Notes (Signed)
Dr. Leonard Schwartz.  Notified blood glucose up to 60 and patient not diabetic and not symptomatic.  Requested IV glucose.  Dr. B states to give the patient a meal tray and stop giving him drink because he has had 5 drinks and may become nauseated.

## 2012-09-10 NOTE — ED Notes (Signed)
Bedside and Verbal shift change report given to brandon (oncoming nurse) by MARGARET J EASTER, RN (offgoing nurse).  Report given with SBAR, ED Summary and MAR.

## 2012-09-10 NOTE — Progress Notes (Signed)
[facility]: MED RECONCILIATION    Information obtained from: Patient, previous medical records    Significant PMH/Disease States:   Past Medical History   Diagnosis Date   ??? Alcohol abuse    ??? Ill-defined condition      seasonal allergies., fatty liver, alcoholism       Chief Complaint for this Admission:   Chief Complaint   Patient presents with   ??? Fall   ??? Back Pain       Allergies: Review of patient's allergies indicates no known allergies.    Prior to Admission Medications:     Prior to Admission Medications   Outpatient Medications Last Dose Informant Patient Reported? Taking?   aspirin 81 mg chewable tablet 09/08/2012 at 0800  No Yes   Sig: Take 1 Tab by mouth daily.   cholecalciferol (VITAMIN D3) 1,000 unit tablet 09/09/2012 at Unknown time  Yes Yes   Sig: Take 1,000 Units by mouth daily.   ibuprofen (ADVIL) 200 mg tablet 09/09/2012 at Unknown time  Yes Yes   Sig: Take 600 mg by mouth two (2) times daily as needed for Pain.   thiamine (B-1) 100 mg tablet Unknown  Yes Yes   Sig: Take 100 mg by mouth daily.      Facility-Administered Medications: None     Thank you,  Cheree Ditto, Pharm.D.  [phone]

## 2012-09-10 NOTE — ED Notes (Signed)
Sent add on to lab.

## 2012-09-10 NOTE — Progress Notes (Signed)
Bedside and Verbal shift change report given to Ashton, RN (oncoming nurse) by Chris K, RN (offgoing nurse).  Report given with SBAR, Kardex, Intake/Output, MAR and Recent Results.

## 2012-09-10 NOTE — H&P (Signed)
Falls  ETOH abuse  I reviewed with the resident the medical history and the resident's findings on the physical examination.  I discussed with the resident the patient's diagnosis and concur with the plan.

## 2012-09-10 NOTE — Progress Notes (Signed)
Spiritual Care Assessment/Progress Notes    Dylan Haney [id number]  [id number]    1925/01/27  79 y.o.  male    Patient Telephone Number: [phone] (home)   Religious Affiliation: Non denominational   Language: English   Extended Emergency Contact Information  Primary Emergency Contact: [facility] [place]  Mobile Phone: [phone]  Relation: Child   Patient Active Problem List    Diagnosis Date Noted   ??? Syncope 09/10/2012   ??? NASH (nonalcoholic steatohepatitis) 12/19/2011   ??? Macrocytosis 12/11/2011   ??? Elevated bilirubin 11/28/2011   ??? Unspecified vitamin D deficiency 11/28/2011   ??? Falls 11/27/2011   ??? ETOH abuse 11/27/2011   ??? A-fib 11/27/2011        Date: 09/10/2012       Level of Religious/Spiritual Activity:  [x]          Involved in faith tradition/spiritual practice    []          Not involved in faith tradition/spiritual practice  [x]          Spiritually oriented    []          Claims no spiritual orientation    []          seeking spiritual identity  []          Feels alienated from religious practice/tradition  []          Feels angry about religious practice/tradition  [x]          Spirituality/religious tradition is a Theatre stage manager for coping at this time.  []          Not able to assess due to medical condition    Services Provided Today:  []          crisis intervention    []          reading Scriptures  []          spiritual assessment    []          prayer  []          empathic listening/emotional support  []          rites and rituals (cite in comments)  []          life review     []          religious support  []          theological development   []          advocacy  []          ethical dialog     []          blessing  []          bereavement support    []          support to family  []          anticipatory grief support   []          help with AMD  []          spiritual guidance    []          meditation      Spiritual Care Needs  []          Emotional Support  []          Spiritual/Religious  Care  []          Loss/Adjustment  []          Advocacy/Referral /Ethics  [x]          No needs expressed at this time  []   Other: (note in comments)  Spiritual Care Plan  []          Follow up visits with pt/family  []          Provide materials  []          Schedule sacraments  []          Contact Community Clergy  [x]          Follow up as needed  []          Other: (note in comments)     Comments: Pastoral support and prayer with patient as he shared of his life and church family, [facility] which is located not far from the hospital.  A friend arrived at the end of the visit. Affirmed the ongoing availability of chaplains for support as part of the team.  Delaney Meigs, Chaplain, M.Div, [facility]  [phone]

## 2012-09-10 NOTE — ED Notes (Signed)
GLF with pain to the lower back and tailbone.  No LOC.  Increasing generalized weakness post recent fall with head injury.

## 2012-09-10 NOTE — Progress Notes (Signed)
09/10/2012   CARE MANAGEMENT NOTE:  CM is following pt in the ER for initial d/c planning.  EMR reviewed.  CM met with pt and friend Milda Smart Stumpner [phone]) at bedside to obtain hx.  Pt's dtr Eunice Blase [phone]) is another contact.  Pt resides alone in a one story home with basement.  PTA, pt was ambulatory without any assistive device, indepn with dressing, and takes sponge baths.  He is able to drive.  Pt has RX drug coverage and he uses [facility] on [address].  He had home healthcare last year from unknown agency but none since that time.  DME in the home includes a cane, and rolling walker.  PCP is Dr. Zebedee Iba.  He does not anticipate any post d/c needs at this writing.  D.Wegner

## 2012-09-10 NOTE — Progress Notes (Addendum)
Patient arrived on floor at 1851.  Patient appears in no acute distress.  Patient denies pain.  Vitals are within defined limits (98.4, 99 HR, 18 resps, 97% O2 saturation, 114/59 BP).  No medications are currently due or overdue. Admission documentation is complete.  Will give bedside report to oncoming nurse.

## 2012-09-10 NOTE — H&P (Signed)
[facility]    History & Physical    Date of admission: 09/10/2012    Patient name: Dylan Haney  MRN: [id number]  Date of birth: 1924/09/30  Age: 79 y.o.     Primary care provider:  Jennye Boroughs, MD     Source of Information: patient, medical records and patient's daughter                                     Subjective:       Chief complaint: falls    History of present illness:   Dylan Haney is a 79 y.o. male with a history of Afib, NASH and alcohol abuse who is admitted with falls, inability to walk. Pt reports several falls over the past few weeks, happen while walking, pt unsure of circumstances leading up to falls. Per daughter, pt is passing out and falling 2/2 alcohol intoxication. Pt has a long history of alcohol abuse, and had stopped for almost a year, but recently started again 2-3 months ago. Stopped drinking last time because he was falling. Currently he drinks 1 pint of vodka per day and thinks his last drink was 3 days ago. Denies any history of alcohol withdrawal, but daughter states he had some symptoms of withdrawal after stopping the last time.   In the ED, CT head was wnl. BG was 47, corrected to 60's with OJ. Pt unable to ambulate to bathroom with 2 assistants and walker.     Meds Given in ER  Medications   sodium chloride (NS) flush 5-10 mL ( IntraVENous Canceled Entry 09/10/12 1400)   sodium chloride (NS) flush 5-10 mL (not administered)   0.9% sodium chloride 1,000 mL with mvi, adult no.4 with vit K 10 mL, thiamine 100 mg, folic acid 1 mg infusion ( IntraVENous New Bag 09/10/12 1535)   sodium chloride (NS) flush 5-10 mL ( IntraVENous Canceled Entry 09/10/12 1507)   sodium chloride (NS) flush 5-10 mL (not administered)   ibuprofen (MOTRIN) tablet 400 mg (not administered)   oxyCODONE-acetaminophen (PERCOCET) 5-325 mg per tablet 1 Tab (not administered)   enoxaparin (LOVENOX) injection 40 mg (40 mg SubCUTAneous Given 09/10/12 1536)   LORazepam (ATIVAN)  injection 2 mg (not administered)   LORazepam (ATIVAN) injection 4 mg (not administered)   diazepam (VALIUM) tablet 10 mg (not administered)   glucose chewable tablet 16 g (not administered)   dextrose (D50W) injection syrg 12.5-25 g (not administered)   glucagon (GLUCAGEN) injection 1 mg (not administered)       No Known Allergies    Prior to Admission Medications   Outpatient Medications Last Dose Informant Patient Reported? Taking?   aspirin 81 mg chewable tablet 09/10/2012 at 0800  No Yes   Sig: Take 1 Tab by mouth daily.   thiamine (B-1) 100 mg tablet 09/10/2012 at 0800  No Yes   Sig: Take 1 Tab by mouth daily.      Facility-Administered Medications: None       Past Medical History   Diagnosis Date   ??? Alcohol abuse    ??? Ill-defined condition      seasonal allergies., fatty liver, alcoholism        History reviewed. No pertinent past surgical history.    History   Substance Use Topics   ??? Smoking status: Former Smoker   ??? Smokeless tobacco: Never Used   ???  Alcohol Use: 0.0 oz/week      Comment: about pint of vodka per day grandson        History reviewed. No pertinent family history.        Social history  Patient resides  X  Independently      With family care      Assisted living      SNF    Ambulates    Independently      With cane     X  Assisted walker - recently started using walker        Alcohol history     None     Social   X  Chronic - 1 pint vodka daily   Smoking history    None   X  Former smoker - x31yrs, quit 52yrs ago     Current smoker       Preventive history:  Immunization History   Administered Date(s) Administered   ??? Pneumococcal Polysaccharide Vaccine, 23 Valent 12/11/2011   ??? TD Vaccine 05/25/2009       Code status discussed with the patient/caregivers    X  Full code     DNR/DNI              Review of systems  Positive only when bolded  Constitutional: HA, F/C, changes in weight  Eyes: Itching/draining, changes in vision  Ears, nose, mouth, throat, and face: Rhinorrea, congestion, sore  throat, ear pain  Respiratory: SOB, wheezing, cough  Cardiovascular: CP, palpitations  Gastrointestinal: Abd pain, D/C, N/V, blood in stool  Genitourinary: Dysuria, hematuria  Integument/breast: Changes in skin, moles or hair, breast tenderness, new lumps or bumps  Hematologic/lymphatic: Changes in bleeding or bruising  Musculoskeletal: Myalgias, arthralgias (back pain)  Neurological: Changes in gait, numbness/tingling      Objective:      Physical Examination   Patient Vitals for the past 12 hrs:   BP Temp Pulse Resp SpO2 Height Weight   09/10/12 1245 135/64 mmHg - - - 99 % - -   09/10/12 1230 157/75 mmHg - - - 99 % - -   09/10/12 1215 151/77 mmHg - - - 100 % - -   09/10/12 1202 139/84 mmHg - - - - - -   09/10/12 1130 124/65 mmHg - - - 100 % - -   09/10/12 1115 118/88 mmHg 97.6 ??F (36.4 ??C) 89 18 99 % 5\' 8"  (1.727 m) 150 lb (68.04 kg)     Temp (24hrs), Avg:97.6 ??F (36.4 ??C), Min:97.6 ??F (36.4 ??C), Max:97.6 ??F (36.4 ??C)    No intake or output data in the 24 hours ending 09/10/12 1437       O2 Device: Room air    Gen: Pt lying in bed, in NAD  Head: Normocephalic  Eyes: Sclera anicteric, EOM grossly intact, PERRL  Nose: Normal nasal mucosa  Throat: MMM, normal lips, tongue and gums. Adentulous  Neck: Supple, no LAD, no thyromegaly or carotid bruits  CVS: Normal S1, S2, +3/6 systolic murmur  Resp: CTAB, no wheezes or rales  Abd: Soft, non-tender, non-distended, +BS  Extrem: No cyanosis or edema  Pulses: 2+ throughout  Skin: Warm, dry. Multiple bruises over extremities and multiple scabbing excoriations   Neuro: Alert, oriented, appropriate. CN II-XII intact. Sensation intact to light touch and temperature throughout. Strength 5/5 throughout      Data Review    LABS:    Recent Labs      09/10/12   1143  WBC  8.5   HGB  15.6   HCT  45.2   PLT  258     Recent Labs      09/10/12   1143   NA  140   K  3.9   CL  99   CO2  19*   GLU  92   BUN  20   CREA  0.99   CA  9.3     No results found for this basename: INR,  in the  last 72 hours      Assessment   Dylan Haney is a 79 y.o. male with a history of Afib, NASH and alcohol abuse who is admitted with falls, inability to walk and hypoglycemia.     Patient Active Problem List    Diagnosis Date Noted   ??? Syncope 09/10/2012   ??? NASH (nonalcoholic steatohepatitis) 12/19/2011   ??? Macrocytosis 12/11/2011   ??? Elevated bilirubin 11/28/2011   ??? Unspecified vitamin D deficiency 11/28/2011   ??? Falls 11/27/2011   ??? ETOH abuse 11/27/2011   ??? A-fib 11/27/2011       Plan   Falls: Alcohol intoxication vs syncope vs fracture vs hypoglycemia. MCV elevated with h/o alcohol abuse. Also with new murmur.  - XR lumbar spine to r/o fracture  - Echo  - Check folate, B12  - EKG  - PT/OT    Alcohol abuse: Drinking 1 pint vodka daily. Last drink 3 days ago. Able to quit in past with help of pastor and PCP  - CIWA protocol. Will load with 10mg  valium x2  - Hepatic function panel  - Goody bag daily  - Chaplain to follow while inpatient    Hypoglycemia: Likely 2/2 poor nutritional intake while drinking  - Accuchecks q6h with glucagon and glucose tabs PRN  - RD consult    NASH: Likely this is actually alcoholic liver disease. Currently asymptomatic  - LFTs    FEN/Diet: Regular  Activity: Ambulate with assistance  DVT prophylaxis: Lovenox  Isolation precautions: None  Consultations: RD, CM, PT/OT, Chaplain  Anticipated disposition: Rehab vs HH  Code Status: FULL CODE     Total time spent 30-74 minutes    Signed by: Windle Guard, MD - Resident    September 10, 2012 at 2:37 PM

## 2012-09-10 NOTE — ED Provider Notes (Signed)
Patient is a 79 y.o. male presenting with fall and back pain. The history is provided by the patient.   Fall  Incident onset: just PTA (also last week) The fall occurred while walking. He fell from a height of ground level. Pain location: low back. The pain is at a severity of 9/10. He was not ambulatory at the scene. Pertinent negatives include no fever, no abdominal pain, no nausea, no vomiting, no loss of consciousness and no laceration.   Back Pain   Pertinent negatives include no fever and no abdominal pain.        Past Medical History   Diagnosis Date   ??? Alcohol abuse    ??? Ill-defined condition      seasonal allergies., fatty liver, alcoholism        History reviewed. No pertinent past surgical history.      History reviewed. No pertinent family history.     History     Social History   ??? Marital Status: WIDOWED     Spouse Name: N/A     Number of Children: N/A   ??? Years of Education: N/A     Occupational History   ??? Not on file.     Social History Main Topics   ??? Smoking status: Former Smoker   ??? Smokeless tobacco: Never Used   ??? Alcohol Use: 0.0 oz/week      Comment: about pint of vodka per day grandson   ??? Drug Use: No   ??? Sexually Active: Not on file     Other Topics Concern   ??? Not on file     Social History Narrative   ??? No narrative on file                  ALLERGIES: Review of patient's allergies indicates no known allergies.      Review of Systems   Constitutional: Negative for fever and chills.   Respiratory: Negative for shortness of breath.    Gastrointestinal: Negative for nausea, vomiting and abdominal pain.   Musculoskeletal: Positive for back pain.   Neurological: Negative for loss of consciousness.   All other systems reviewed and are negative.        Filed Vitals:    09/10/12 1115   BP: 118/88   Pulse: 89   Temp: 97.6 ??F (36.4 ??C)   Resp: 18   Height: 5\' 8"  (1.727 m)   Weight: 68.04 kg (150 lb)   SpO2: 99%            Physical Exam   Nursing note and vitals reviewed.  Constitutional: He is  oriented to person, place, and time. He appears well-developed and well-nourished. No distress.   HENT:   Head: Normocephalic and atraumatic.   Mouth/Throat: Oropharynx is clear and moist.   Eyes: Conjunctivae and EOM are normal. Pupils are equal, round, and reactive to light.   Neck: Normal range of motion.   Cardiovascular: Normal rate, regular rhythm, normal heart sounds and intact distal pulses.    No murmur heard.  Pulmonary/Chest: Effort normal and breath sounds normal. No stridor. No respiratory distress. He has no wheezes.   Abdominal: Soft. Bowel sounds are normal. There is no tenderness.   Musculoskeletal: Normal range of motion. He exhibits tenderness. He exhibits no edema.   Neurological: He is alert and oriented to person, place, and time. No cranial nerve deficit.   Skin: Skin is warm and dry. No laceration noted. He is not diaphoretic.  Psychiatric: He has a normal mood and affect.        MDM     Differential Diagnosis; Clinical Impression; Plan:     Hypoglycemia with failure to thrive  Alcohol abuse and frequent falls.    Patient unable to walk - admit for PT and possible placement as he lives alone      Amount and/or Complexity of Data Reviewed:   Clinical lab tests:  Reviewed and ordered  Tests in the radiology section of CPT??:  Reviewed and ordered      Procedures

## 2012-09-10 NOTE — ED Notes (Signed)
Blood glucose 47 at  1118 and rechecked at that time.  Given 2 OJ and repeated at 1138 and was 57.  Given 2 more OJ and will repeat at 1153.  Patient weak but A&OX4. Not diabetic.

## 2012-09-10 NOTE — Progress Notes (Signed)
TRANSFER - IN REPORT:    Verbal report received from [place], RN(name) on Dylan Haney  being received from ED (unit) for routine progression of care      Report consisted of patient???s Situation, Background, Assessment and   Recommendations(SBAR).     Information from the following report(s) SBAR, Kardex, Intake/Output, MAR and Recent Results was reviewed with the receiving nurse.    Opportunity for questions and clarification was provided.      Assessment completed upon patient???s arrival to unit and care assumed.

## 2012-09-10 NOTE — ED Notes (Signed)
Attempted to ambulate patient but he was not able to make it to the bathroom even with 2 assistance and a walker.  MD notified and patient place back in bed with rails up and wheels locked and call bell in reach.  Patient and family notified of admission and family went home to get personals.

## 2012-09-10 NOTE — Progress Notes (Signed)
Skin bundle completed by Phineas Semen, RN and Alena Bills

## 2012-09-11 LAB — EKG, 12 LEAD, INITIAL
Atrial Rate: 131 {beats}/min
Calculated R Axis: -55 degrees
Calculated T Axis: 120 degrees
Q-T Interval: 404 ms
QRS Duration: 144 ms
QTC Calculation (Bezet): 496 ms
Ventricular Rate: 91 {beats}/min

## 2012-09-11 LAB — HEPATIC FUNCTION PANEL
A-G Ratio: 1.2 (ref 1.1–2.2)
ALT (SGPT): 42 U/L (ref 12–78)
AST (SGOT): 68 U/L — ABNORMAL HIGH (ref 15–37)
Albumin: 3.4 g/dL — ABNORMAL LOW (ref 3.5–5.0)
Alk. phosphatase: 129 U/L — ABNORMAL HIGH (ref 45–117)
Bilirubin, direct: 0.6 MG/DL — ABNORMAL HIGH (ref 0.0–0.2)
Bilirubin, total: 1.2 MG/DL — ABNORMAL HIGH (ref 0.2–1.0)
Globulin: 2.8 g/dL (ref 2.0–4.0)
Protein, total: 6.2 g/dL — ABNORMAL LOW (ref 6.4–8.2)

## 2012-09-11 LAB — METABOLIC PANEL, COMPREHENSIVE
A-G Ratio: 1 — ABNORMAL LOW (ref 1.1–2.2)
ALT (SGPT): 41 U/L (ref 12–78)
AST (SGOT): 67 U/L — ABNORMAL HIGH (ref 15–37)
Albumin: 3.3 g/dL — ABNORMAL LOW (ref 3.5–5.0)
Alk. phosphatase: 137 U/L — ABNORMAL HIGH (ref 45–117)
Anion gap: 11 mmol/L (ref 5–15)
BUN/Creatinine ratio: 23 — ABNORMAL HIGH (ref 12–20)
BUN: 23 MG/DL — ABNORMAL HIGH (ref 6–20)
Bilirubin, total: 1.5 MG/DL — ABNORMAL HIGH (ref 0.2–1.0)
CO2: 25 mmol/L (ref 21–32)
Calcium: 9 MG/DL (ref 8.5–10.1)
Chloride: 103 mmol/L (ref 97–108)
Creatinine: 0.99 MG/DL (ref 0.45–1.15)
GFR est AA: >60 mL/min/{1.73_m2} (ref >60)
GFR est non-AA: >60 mL/min/{1.73_m2} (ref >60)
Globulin: 3.2 g/dL (ref 2.0–4.0)
Glucose: 112 mg/dL — ABNORMAL HIGH (ref 65–100)
Potassium: 3.9 mmol/L (ref 3.5–5.1)
Protein, total: 6.5 g/dL (ref 6.4–8.2)
Sodium: 139 mmol/L (ref 136–145)

## 2012-09-11 LAB — GLUCOSE, POC
Glucose (POC): 110 mg/dL (ref 75–110)
Glucose (POC): 88 mg/dL (ref 75–110)
Glucose (POC): 108 mg/dL (ref 75–110)
Glucose (POC): 106 mg/dL (ref 75–110)

## 2012-09-11 LAB — VITAMIN B12: Vitamin B12: 592 pg/mL (ref 211–911)

## 2012-09-11 LAB — CBC W/O DIFF
HCT: 41.4 % (ref 36.6–50.3)
HGB: 14.4 g/dL (ref 12.1–17.0)
MCH: 38 PG — ABNORMAL HIGH (ref 26.0–34.0)
MCHC: 34.8 g/dL (ref 30.0–36.5)
MCV: 109.2 FL — ABNORMAL HIGH (ref 80.0–99.0)
PLATELET: 220 10*3/uL (ref 150–400)
RBC: 3.79 M/uL — ABNORMAL LOW (ref 4.10–5.70)
RDW: 14 % (ref 11.5–14.5)
WBC: 7.9 10*3/uL (ref 4.1–11.1)

## 2012-09-11 LAB — FOLATE: Folate: 23.7 ng/mL — ABNORMAL HIGH (ref 5.0–21.0)

## 2012-09-11 MED ADMIN — 0.9% sodium chloride 1,000 mL with mvi, adult no.4 with vit K 10 mL, thiamine 100 mg, folic acid 1 mg infusion: INTRAVENOUS | NDC 63323018410

## 2012-09-11 MED ADMIN — sodium chloride (NS) flush 5-10 mL: INTRAVENOUS | @ 18:00:00 | NDC 87701099893

## 2012-09-11 MED ADMIN — lisinopril (PRINIVIL, ZESTRIL) tablet 5 mg: ORAL | @ 18:00:00 | NDC 00185540010

## 2012-09-11 MED ADMIN — sodium chloride (NS) flush 5-10 mL: INTRAVENOUS | @ 10:00:00 | NDC 87701099893

## 2012-09-11 MED ADMIN — enoxaparin (LOVENOX) injection 40 mg: SUBCUTANEOUS | @ 21:00:00 | NDC 00075801401

## 2012-09-11 MED ADMIN — sodium chloride (NS) flush 5-10 mL: INTRAVENOUS | @ 01:00:00 | NDC 87701099893

## 2012-09-11 MED ADMIN — lisinopril (PRINIVIL, ZESTRIL) tablet 5 mg: ORAL | @ 17:00:00 | NDC 82009006310

## 2012-09-11 NOTE — Progress Notes (Signed)
NUTRITION    RECOMMENDATIONS:     1. Po MVI, thiamine 100mg , folate 1mg   2.   High folate levels may be masking B12 def, supplement with Cyanocobalamin (B12) 1000 mcg/day  3.   Encourage intake of meals and snacks/supplements to maintain euglycemia    ASSESSMENT:   Consult received via Rn adm screening for hx of wt loss and MD consult received for general nutrition management and supplements.  Pt w/ hx of poor po intake d/t drinking ETOH daily.  Reports wt loss but unable to quantify.  Pt noted to have hypoglycemia on adm, suspect d/t poor po intake.  Pt ate a good breakfast.  Past Medical History   Diagnosis Date   ??? Alcohol abuse    ??? Ill-defined condition      seasonal allergies., fatty liver, alcoholism   ??? Ill-defined condition      a-fib   ??? Cardiomyopathy 09/11/2012     A.  Echo (09/11/12):  EF 35-40% with mod GHK.  Mildly dil RV.  Mod dil RA/LA.  Mild MR.  Mod-severe TR.  Mod PR.  PASP 60.       Diet: Dental soft    Abd: soft  BM: 8/13  Skin: scattered abrasions on arm    Medications: [x]  Reviewed & Includes: IV Goodie Bag    Labs:    Lab Results   Component Value Date/Time    Sodium 139 09/11/2012  3:44 AM    Potassium 3.9 09/11/2012  3:44 AM    Chloride 103 09/11/2012  3:44 AM    CO2 25 09/11/2012  3:44 AM    Anion gap 11 09/11/2012  3:44 AM    Glucose 112 09/11/2012  3:44 AM    BUN 23 09/11/2012  3:44 AM    Creatinine 0.99 09/11/2012  3:44 AM    Calcium 9.0 09/11/2012  3:44 AM    Albumin 3.3 09/11/2012  3:44 AM       Anthropometrics:  ,  , BMI (calculated): 22.9  Wt Readings from Last 1 Encounters:   09/10/12 68.04 kg (150 lb)      Ht Readings from Last 1 Encounters:   09/10/12 5\' 8"  (1.727 m)    Body mass index is 22.81 kg/(m^2).    Estimated Nutrition Needs: 1904 Kcals/day (28 kcals/kg), Protein (g): 81.6 g (1.2g/kg) Fluid (ml): 1900 ml  Based on:   [x]   Actual BW    []   IBW   []  Adjusted BW      Education & Discharge Needs:   []  None Identified   [x]  Identified and addressed    [x]  Participated in care plan,  discharge planning, Interdisciplinary rounds        Cultural, religious and ethnic food preferences identified    [x]  None   []  Yes     NUTRITION DIAGNOSIS:     Diagnosis-Intake: Inadequate oral food/beverage intake (NI-2.1) (r/t etoh consumption AEB report of 27meal/day)    Pt is at Nutrition Risk:  [x]     No Nutrition Risk Identified:  []     RD INTERVENTION / PT GOALS:     Nutrition Interventions:  Intervention :Food/Nutrient Delivery (ND): Yes  Meals/Snacks (ND-1): General/healthful diet (ND-1.1) encouraged  3 meals/day + snacks to prevent low blood sugar.  Supplements (ND-3): Commercial supplement (ND-3.1.1); ensure w/meals  Multivitamin/mineral (ND-3.2.1)  For hx of etoh  Goal: Po intake > 65% of meals and supplements w/in 4-6 days.    MONITORING & EVALUATION:   Previous Recommendations:  Previous Goal:      _0   Implemented                                                                           _1   Met   _2  Not Implemented (RD to address)         _3   Making Progress   _4  N/A              _5   Not Met                                                                                                        _6   N/A      PAMELA CAMPBELL, RD

## 2012-09-11 NOTE — Progress Notes (Signed)
Echo completed report to follow.

## 2012-09-11 NOTE — Progress Notes (Signed)
Bedside and Verbal shift change report given to Jolene Schimke, RN (oncoming nurse) by Harrie Jeans, RN (offgoing nurse). Report included the following information SBAR, Kardex, Intake/Output, MAR and Recent Results.

## 2012-09-11 NOTE — Progress Notes (Signed)
Bedside and Verbal shift change report given to [facility], Charity fundraiser (Cabin crew) by Phineas Semen, RN (offgoing nurse).  Report given with SBAR and Kardex.

## 2012-09-11 NOTE — Progress Notes (Signed)
Problem: Mobility Impaired (Adult and Pediatric)  Goal: *Acute Goals and Plan of Care (Insert Text)  Physical Therapy Goals  Initiated 09/11/2012  1. Patient will move from supine to sit and sit to supine , scoot up and down and roll side to side in bed with independence within 7 day(s).   2. Patient will transfer from bed to chair and chair to bed with independence using the least restrictive device within 7 day(s).  3. Patient will perform sit to stand with independence within 7 day(s).  4. Patient will ambulate with modified independence for 200 feet with the least restrictive device within 7 day(s).   5. Patient will ascend/descend 4 stairs with handrail(s) with modified independence within 7 day(s).   PHYSICAL THERAPY EVALUATION  Patient: Dylan Haney (79 y.o. male)  Date: 09/11/2012  Primary Diagnosis: Syncope  Syncope        Precautions:   Fall;Seizure;Skin      ASSESSMENT :  Based on the objective data described below, the patient presents with generalized weakness and debility has history of multiple falls weekly for at least 6 months per patient and admits that he drinks. Ambulate with rolling walker min assist, noted step to gait and shaking. Patient reports that he feels tired and cannot go anymore. Assisted patient back to bed supine, asked nurse if ok to transfer to bed but stated not to due to seizure precautions for now. We will with nurse tomorrow if ok to be OOB to chair nurse agreed. Recommend HHPT with 24 hr care for safeyt for diposition.      Patient will benefit from skilled intervention to address the above impairments.  Patient???s rehabilitation potential is considered to be Excellent  Factors which may influence rehabilitation potential include:   [ ]          None noted  [ ]          Mental ability/status  [ ]          Medical condition  [ ]          Home/family situation and support systems  [X]          Safety awareness  [ ]          Pain tolerance/management  [ ]          Other:        PLAN :   Recommendations and Planned Interventions:  [X]            Bed Mobility Training             [ ]     Neuromuscular Re-Education  [X]            Transfer Training                   [ ]     Orthotic/Prosthetic Training  [X]            Gait Training                         [ ]     Modalities  [X]            Therapeutic Exercises           [ ]     Edema Management/Control  [X]            Therapeutic Activities            [ ]     Patient and Family Training/Education  [ ]   Other (comment):    Frequency/Duration: Patient will be followed by physical therapy 6 times a week to address goals.  Discharge Recommendations: Home Health and To Be Determined  Further Equipment Recommendations for Discharge: already has Rolling walker at home       SUBJECTIVE:   Patient stated ???I hurt all over from falling.???      OBJECTIVE DATA SUMMARY:       Past Medical History   Diagnosis Date   ??? Alcohol abuse     ??? Ill-defined condition         seasonal allergies., fatty liver, alcoholism   ??? Ill-defined condition         a-fib   History reviewed. No pertinent past surgical history.  Prior Level of Function/Home Situation: Independent community ambulator without assistive device.  Home Situation  Home Environment: Private residence  # Steps to Enter: 3  Rails to Enter: Yes  Hand Rails : Bilateral  One/Two Story Residence: One story  Living Alone: Yes   Support Systems: Child(ren)  Patient Expects to be Discharged to:: Private residence  Current DME Used/Available at Home: Walker, rolling;Cane, straight;Shower chair;Grab bars  Tub or Shower Type: Shower  Critical Behavior:  Neurologic State: Alert  Orientation Level: Oriented X4  Cognition: Appropriate decision making;Appropriate for age attention/concentration;Appropriate safety awareness;Follows commands  Safety/Judgement: Awareness of environment;Insight into deficits    Strength:    Strength: Generally decreased, functional                    Tone & Sensation:   Tone: Normal               Sensation: Intact               Range Of Motion:  AROM: Generally decreased, functional           PROM: Generally decreased, functional           Coordination:  Coordination: Generally decreased, functional    Functional Mobility:  Bed Mobility:  Rolling: Moderate assistance;Additional time  Supine to Sit: Moderate assistance;Additional time  Sit to Supine: Moderate assistance;Additional time  Scooting: Moderate assistance;Additional time  Transfers:  Sit to Stand: Minimum assistance;Additional time;Safety concerns  Stand to Sit: Minimum assistance;Additional time;Safety concerns  Stand Pivot Transfers: Min assist, patient performs 75-90%     Bed to Chair: Minimum assistance;Additional time;Safety concerns              Balance:   Sitting: Intact;High guard (seizure precautions)  Standing: Impaired;With support  Standing - Static: Fair  Standing - Dynamic : Poor  Ambulation/Gait Training:  Distance (ft): 2 Feet (ft)  Assistive Device: Walker, rolling;Gait belt  Ambulation - Level of Assistance: Minimal assistance     Gait Description (WDL): Exceptions to WDL  Gait Abnormalities: Step to gait        Base of Support: Widened     Speed/Cadence: Slow;Fluctuations                                  Therapeutic Exercises:    Instructed patient to continue active range of motion exercise on both legs while up on chair or on bed.     Pain:  Pain Scale 1: Numeric (0 - 10)  Pain Intensity 1: 0              Activity Tolerance:   Good.  Please refer to the flowsheet  for vital signs taken during this treatment.  After treatment:   [X]          Patient left in no apparent distress sitting up in chair  [ ]          Patient left in no apparent distress in bed  [X]          Call bell left within reach  [X]          Nursing notified  [ ]          Caregiver present  [ ]          Bed alarm activated      COMMUNICATION/EDUCATION:   The patient???s plan of care was discussed with: Occupational Therapist, Registered Nurse and patient.  [X]           Fall prevention education was provided and the patient/caregiver indicated understanding.  [X]          Patient/family have participated as able in goal setting and plan of care.  [X]          Patient/family agree to work toward stated goals and plan of care.  [ ]          Patient understands intent and goals of therapy, but is neutral about his/her participation.  [ ]          Patient is unable to participate in goal setting and plan of care.    Thank you for this referral.  ALBERTO E DELA CRUZ, PT,WCC.   Time Calculation: 25 mins

## 2012-09-11 NOTE — Wound Image (Signed)
Wound care: nurse consult to assess skin on admission. Patient alert and oriented, in no distress. Resting on an accumax surface.    Assessment  All skin folds and bony prominences assessed, turned with assistance.  Multiple scattered areas of bruising noted on back, arms, sacral area and lower legs. Dry healed scabs noted on the right lateral arm. Patient states he has had multiple falls over the last few weeks.  Left lateral elbow: scattered partial thickness wounds with small amount of yellow serous drainage, peri wound is darker and bruised, skin is thin and friable.  Heels intact, reddened and blanching.     Treatment  Repositioned in bed, heels floated.   Dimethicone cream applied to sacral and perineal area.  Left elbow cleansed with soap and water, skin prep and allevyn applied.    Recommendations/plan  Turn, reposition every 2 hours, float heels.  Mobility team, nutrition consult.  Wound care to left elbow with allevyn foam dressings.  Incontinent, perineal care with comfort shields or dimethicone cream.  Plan of care discussed with Dr Verlin Fester, orders obtained.  Will follow, reconsult as needed.    Eliot Ford RN CWCN

## 2012-09-11 NOTE — Consults (Signed)
Dylan Altes, MD, [facility]  [facility] [address]  Office [phone]    Date of  Admission: 09/10/2012 11:10 AM         IMPRESSION and RECOMMENDATIONS     1.  Cardiomyopathy:  Etiology unknown, though I suspect EtOH given global hypokinesis.  Will obtain lexiscan cardiolite in AM to eval for ischemia.  Start low-dose ACEI (lisinopril).  No beta-blocker due to underlying conduction system disease.    2.  AFib:  Intrinsically rate-controlled.  Discussed CVA risk and anticoagulation, but the risks of anticoagulation may outweigh the benefits given alcoholism and very frequent falls.      Discussed with Pt and family.         Problem List Date Reviewed: 09/11/2012        ICD-9-CM Class Noted    Syncope 780.2  09/10/2012        NASH (nonalcoholic steatohepatitis) 571.8  12/19/2011    Overview    Signed 12/19/2011  4:30 PM by Jennye Boroughs, MD      [place] 2013          Macrocytosis 289.89  12/11/2011    Overview    Signed 12/11/2011  2:23 PM by Jennye Boroughs, MD      B12 and folate normal  Uses ETOH          Elevated bilirubin 782.4  11/28/2011    Overview    Addendum 11/28/2011  2:54 PM by Jennye Boroughs, MD      He could have Gilbert's syndrome although he uses ETOH daily  Needs [place]          Unspecified vitamin D deficiency 268.9  11/28/2011    Overview    Signed 11/28/2011  2:54 PM by Jennye Boroughs, MD      Needs Rx          Falls [phone]  11/27/2011    Overview    Signed 11/27/2011  1:41 PM by Jennye Boroughs, MD      Head CT:  No Intracranial Disease Evident on Head CT.            ETOH abuse 305.00  11/27/2011        A-fib 427.31  11/27/2011    Overview    Signed 11/27/2011  1:39 PM by Jennye Boroughs, MD      Atrial fibrillation with premature ventricular or aberrantly conducted   complexes  Right bundle branch block  Left anterior fascicular block  ** Bifascicular block **  Minimal voltage criteria for LVH, may be normal variant  Possible Lateral infarct , age  undetermined  Abnormal ECG  No previous ECGs available  Confirmed by [id number] Flakes MD,Shakil ([id number]) on 11/25/2011 11:08:06 AM                  History of Present Illness:     Dylan Haney is a 79 y.o. male with the above problem list who was admitted for Syncope;Syncope.    Presents with recurrent falls due to EtOH abuse, poor po, and hypoglycemia.  Echo obtained reveals moderate LV dysfunction.      He denies chest pain/discomfort, shortness of breath, dyspnea on exertion, orthopnea, paroxysmal noctural dyspnea, lower extremity edema, palpitations, syncope, or near-syncope.    Current Facility-Administered Medications   Medication Dose Route Frequency   ??? lisinopril (PRINIVIL, ZESTRIL) tablet 5 mg  5 mg Oral DAILY   ??? sodium chloride (NS) flush 5-10 mL  5-10 mL IntraVENous Q8H   ??? sodium chloride (NS) flush 5-10 mL  5-10 mL IntraVENous PRN   ??? 0.9% sodium chloride 1,000 mL with mvi, adult no.4 with vit K 10 mL, thiamine 100 mg, folic acid 1 mg infusion   IntraVENous DAILY   ??? sodium chloride (NS) flush 5-10 mL  5-10 mL IntraVENous Q8H   ??? sodium chloride (NS) flush 5-10 mL  5-10 mL IntraVENous PRN   ??? ibuprofen (MOTRIN) tablet 400 mg  400 mg Oral Q4H PRN   ??? oxyCODONE-acetaminophen (PERCOCET) 5-325 mg per tablet 1 Tab  1 Tab Oral Q4H PRN   ??? enoxaparin (LOVENOX) injection 40 mg  40 mg SubCUTAneous Q24H   ??? LORazepam (ATIVAN) injection 2 mg  2 mg IntraVENous Q1H PRN   ??? LORazepam (ATIVAN) injection 4 mg  4 mg IntraVENous Q1H PRN   ??? glucose chewable tablet 16 g  4 Tab Oral PRN   ??? dextrose (D50W) injection syrg 12.5-25 g  12.5-25 g IntraVENous PRN   ??? glucagon (GLUCAGEN) injection 1 mg  1 mg IntraMUSCular PRN      No Known Allergies   History reviewed. No pertinent family history.   History     Social History   ??? Marital Status: WIDOWED     Spouse Name: N/A     Number of Children: N/A   ??? Years of Education: N/A     Occupational History   ??? Not on file.     Social History Main Topics   ??? Smoking status: Former  Smoker   ??? Smokeless tobacco: Never Used   ??? Alcohol Use: 0.0 oz/week      Comment: about pint of vodka per day grandson   ??? Drug Use: No   ??? Sexually Active: Not on file     Other Topics Concern   ??? Not on file     Social History Narrative   ??? No narrative on file       Physical Exam:     Patient Vitals for the past 16 hrs:   BP Temp Pulse Resp SpO2   09/11/12 0939 128/67 mmHg 97.7 ??F (36.5 ??C) 90 17 100 %   09/11/12 0446 133/65 mmHg 97.8 ??F (36.6 ??C) 85 18 96 %   09/11/12 0043 143/73 mmHg 98.4 ??F (36.9 ??C) 95 18 99 %   09/10/12 2128 120/66 mmHg 98.2 ??F (36.8 ??C) 94 18 97 %       HEENT Exam:     Normocephalic, atraumatic. EOMI. Oropharynx negative. Neck supple. No lymphadenopathy.          Lung Exam:     The patient is not dyspneic. There is no cough. Breath sounds are heard equally in all lung fields. There are no wheezes, rales, rhonchi, or rubs heard on auscultation.          Heart Exam:     The rhythm is irregularly irregular. The PMI is in the 5th intercostal space of the MCL. Apical impulse is normal. S1 is regular. S2 is physiologic. There is no S3, S4 gallop, murmur, click, or rub.          Abdomen Exam:     Bowel sounds are normoactive. Abdomen soft in all quadrants. No tenderness. No palpable masses. No organomegaly. No hernias noted. No bruits or pulsatile mass.         Extremities Exam:     The extremities are atraumatic appearing. There is no clubbing, cyanosis, edema, ulcers, varicose  veins, rash, erythemia noted in the extremities. The neurovascular status is grossly intact with normal distal sensation and pulses.          Vascular Exam:     The radial, brachial, dorsalis pedis, posterior tibial, are equal and strong bilaterally The carotids are equal bilaterally without bruits.          Labs:     Lab Results   Component Value Date/Time    Glucose 112 09/11/2012  3:44 AM    Sodium 139 09/11/2012  3:44 AM    Potassium 3.9 09/11/2012  3:44 AM    Chloride 103 09/11/2012  3:44 AM    CO2 25 09/11/2012  3:44 AM     BUN 23 09/11/2012  3:44 AM    Creatinine 0.99 09/11/2012  3:44 AM    Calcium 9.0 09/11/2012  3:44 AM     Recent Labs      09/11/12   0344  09/10/12   1143   WBC  7.9  8.5   HGB  14.4  15.6   HCT  41.4  45.2   PLT  220  258     Recent Labs      09/11/12   0344  09/10/12   2155   SGOT  67*  68*   ALT  41  42   AP  137*  129*   TBILI  1.5*  1.2*   TP  6.5  6.2*   ALB  3.3*  3.4*   GLOB  3.2  2.8     No results found for this basename: INR, PTP, APTT,  in the last 72 hours   Recent Labs      09/10/12   1143   CPK  472*     No results found for this basename: TROIQ,  in the last 72 hours  No results found for this basename: CHOL, CHOLX, CHOLP, CHLST, CHOLV, HDL, HDLC, HDLP, LDL, DLDL, LDLC, DLDLP, TGL, TGLX, TRIGL, TRIGP, CHHD, CHHDX       EKG:  AF @ 91, RBBB, LAFB.    Echo:  SUMMARY:  Left ventricle: Systolic function was moderately reduced. Ejection  fraction was estimated in the range of 35 % to 40 %. There was moderate  diffuse hypokinesis.    Right ventricle: The ventricle was mildly dilated. Systolic function was  mildly reduced.    Left atrium: The atrium was dilated.    Right atrium: The atrium was moderately to markedly dilated.    Mitral valve: There was mild regurgitation.    Tricuspid valve: There was moderate to severe regurgitation. There was  moderate to severe pulmonary hypertension.    Pulmonic valve: There was moderate regurgitation.

## 2012-09-11 NOTE — Other (Signed)
DTC Progress Note    Recommendations/ Comments: Cahrt reviewed secondary to patient with hypoglycemia on admission.  No history of diabetes noted.  Hypoglycemia likely secondary to ETOH abuse.  BG now within normal limits for non-diabetes.    Chart reviewed on Dylan Haney.    Patient is a 79 y.o. male with no history oif diabetes  A1c:   No results found for this basename: HBA1C, HGBE8       Recent Glucose Results:   Lab Results   Component Value Date/Time    GLU 112* 09/11/2012  3:44 AM    GLUCPOC 108 09/11/2012  1:20 PM    GLUCPOC 88 09/11/2012  6:15 AM    GLUCPOC 110 09/10/2012 11:58 PM        Lab Results   Component Value Date/Time    Creatinine 0.99 09/11/2012  3:44 AM       Active Orders   Diet    DIET DENTAL SOFT (SOFT SOLID) No Caffeine    DIET NPO        PO intake: No data found.          Will continue to follow as needed.    Thank you.    Fayrene Helper, RD, CDE

## 2012-09-11 NOTE — Progress Notes (Signed)
Problem: Self Care Deficits Care Plan (Adult)  Goal: *Acute Goals and Plan of Care (Insert Text)  Occupational Therapy Goals  Initiated 09/11/2012  1. Patient will perform lower body dressing with minimal assistance/contact guard assist within 7 day(s).  2. Patient will perform toilet transfers with minimal assistance/contact guard assist within 7 day(s).  3. Patient will perform all aspects of toileting with minimal assistance/contact guard assist within 7 day(s).  4. Patient will participate in upper extremity therapeutic exercise/activities with independence for at least 8 minutes within 7 day(s).   5. Patient will utilize energy conservation techniques during functional activities with verbal cues within 7 day(s).  OCCUPATIONAL THERAPY EVALUATION INCLUDING A FUNCTIONAL MEASURE  Patient: Dylan Haney (79 y.o. male)  Date: 09/11/2012  Primary Diagnosis: Syncope  Syncope        Precautions: Fall;Seizure;Skin      ASSESSMENT :  Based on the objective data described below, the patient presents with c/o pain all over from fall he sustained prior to admission, decreased strength, endurance, mobility, balance, safety and coodination.  Pt is very shaky on his feet, with increased anxiety and fear of falling.  He is currently at an overall mod A level with LE ADLs, toileting and functional mobility.  Pt states prior to admission he has been having very frequent falls over the past 6 months and started using a RW 2 days ago.  He lives alone and have family in the area.  Recommend 24/7 assistance at discharge for safety and HH therapy vs rehab depending on assistance available.      Patient will benefit from skilled intervention to address the above impairments.  Patient???s rehabilitation potential is considered to be Guarded  Factors which may influence rehabilitation potential include:   [ ]              None noted  [ ]              Mental ability/status  [X]              Medical condition  [ ]              Home/family  situation and support systems  [ ]              Safety awareness  [ ]              Pain tolerance/management  [X]              Other: ETOH abuse       PLAN :  Recommendations and Planned Interventions:  [X]                Self Care Training                  [X]         Therapeutic Activities  [X]                Functional Mobility Training    [X]         Cognitive Retraining  [X]                Therapeutic Exercises           [X]         Endurance Activities  [X]                Balance Training                   [X]         Neuromuscular Re-Education  [ ]   Visual/Perceptual Training     [X]    Home Safety Training  [X]                Patient Education                 [X]         Family Training/Education  [ ]                Other (comment):    Frequency/Duration: Patient will be followed by occupational therapy 5 times a week to address goals.  Discharge Recommendations: Home Health, Skilled Nursing Facility and To Be Determined  Further Equipment Recommendations for Discharge: TBD       SUBJECTIVE:   Patient stated ???I have stopped drinking before and did not go through withdrawl.???      OBJECTIVE DATA SUMMARY:       Past Medical History   Diagnosis Date   ??? Alcohol abuse     ??? Ill-defined condition         seasonal allergies., fatty liver, alcoholism   ??? Ill-defined condition         a-fib   History reviewed. No pertinent past surgical history.  Prior Level of Function/Home Situation: Pt states prior to admission he has been having very frequent falls over the past 6 months and started using a RW 2 days ago.  He lives alone and have family in the area.   Home Situation  Home Environment: Private residence  # Steps to Enter: 3  Rails to Enter: Yes  Hand Rails : Bilateral  One/Two Story Residence: One story  Living Alone: Yes   Support Systems: Child(ren)  Patient Expects to be Discharged to:: Private residence  Current DME Used/Available at Home: Walker, rolling;Cane, straight;Shower chair;Grab bars  Tub or Shower  Type: Shower  [X]   Right hand dominant   [ ]   Left hand dominant  Cognitive/Behavioral Status:  Neurologic State: Alert  Orientation Level: Oriented X4  Cognition: Appropriate decision making;Appropriate for age attention/concentration;Appropriate safety awareness;Follows commands  Perception: Appears intact  Perseveration: No perseveration noted  Safety/Judgement: Awareness of environment;Insight into deficits    Vision/Perceptual:    Acuity: Impaired near vision;Impaired far vision    Corrective Lenses: Glasses  Coordination:  Coordination: Generally decreased, functional  Fine Motor Skills-Upper: Left Intact;Right Intact    Gross Motor Skills-Upper: Left Intact;Right Intact  Balance:  Sitting: Intact;High guard (seizure precautions)  Standing: Impaired;With support  Standing - Static: Fair  Standing - Dynamic : Poor  Strength:  Strength: Generally decreased, functional              Tone & Sensation:  Tone: Normal  Sensation: Intact                    Range of Motion:  AROM: Generally decreased, functional  PROM: Generally decreased, functional                    Functional Mobility and Transfers for ADLs:  Bed Mobility:  Rolling: Moderate assistance;Additional time  Supine to Sit: Moderate assistance;Additional time  Sit to Supine: Moderate assistance;Additional time  Scooting: Moderate assistance;Additional time  Transfers:  Sit to Stand: Minimum assistance;Additional time;Safety concerns  Stand Pivot Transfers: Min assist, patient performs 75-90%  Bed to Chair: Minimum assistance;Additional time;Safety concerns  Toilet Transfer : Moderate assistance (to BSC using RW)                                               ADL Assessment:  Feeding: Supervision/set up    Oral Facial Hygiene/Grooming: Supervision/set up;Additional time    Bathing: Minimum assistance (seated - decreased balance and mobility)    Upper Body Dressing: Minimum assistance (due to pain)    Lower Body Dressing:  Moderate assistance (decreased safety and ability to reach feet) - pt able to cross LLE, but not R to reach feet    Toileting: Moderate assistance (for safety with standing)     Cognitive Retraining  Safety/Judgement: Awareness of environment;Insight into deficits    Functional Measure:  Barthel Index:      Bathing: 0  Bladder: 10  Bowels: 10  Grooming: 0  Dressing: 5  Feeding: 5  Grooming: 0  Mobility: 5  Stairs: 0  Toilet Use: 5  Transfer (Bed to Chair and Back): 5  Total: 45        Barthel and G-code impairment scale:  Percentage of impairment CH  0% CI  1-19% CJ  20-39% CK  40-59% CL  60-79% CM  80-99% CN  100%   Barthel Score 0-100 100 99-80 79-60 59-40 20-39 1-19    0   Barthel Score 0-20 20 17-19 13-16 9-12 5-8 1-4 0      The Barthel ADL Index: Guidelines  1. The index should be used as a record of what a patient does, not as a record of what a patient could do.  2. The main aim is to establish degree of independence from any help, physical or verbal, however minor and for whatever reason.  3. The need for supervision renders the patient not independent.  4. A patient's performance should be established using the best available evidence. Asking the patient, friends/relatives and nurses are the usual sources, but direct observation and common sense are also important. However direct testing is not needed.  5. Usually the patient's performance over the preceding 24-48 hours is important, but occasionally longer periods will be relevant.  6. Middle categories imply that the patient supplies over 50 per cent of the effort.  7. Use of aids to be independent is allowed.    Clarisa Kindred., Barthel, D.W. [phone]). Functional evaluation: the Barthel Index. Md State Med J (14)2.  Zenaida Niece [place], J.J.M.F, [place], Ian Malkin., Margret Chance., [place], [place]. (1999). Measuring the change indisability after inpatient rehabilitation; comparison of the responsiveness of the Barthel Index and Functional Independence Measure. Journal of  Neurology, Neurosurgery, and Psychiatry, 66(4), [phone].  Dawson Bills, N.J.A, [place],  W.J.M, & [place], M.A. (2004.) Assessment of post-stroke quality of life in cost-effectiveness studies: The usefulness of the Barthel Index and the EuroQoL-5D. Quality of Life Research, 13, 427-43       Pain:  Pain Scale 1: Numeric (0 - 10)  Pain Intensity 1: 0              Activity Tolerance:   Fair  Please refer to the flowsheet for vital signs taken during this treatment.  After treatment:   [ ]  Patient left in no apparent distress sitting up in chair  [X]  Patient left in no apparent distress in bed  [X]  Call bell left within reach  [X]  Nursing notified  [ ]   Caregiver present  [ ]  Bed alarm activated      COMMUNICATION/EDUCATION:   The patient???s plan of care was discussed with: Physical Therapist and Registered Nurse.  [X]  Home safety education was provided and the patient/caregiver indicated understanding.  [X]  Patient/family have participated as able in goal setting and plan of care.  [X]  Patient/family agree to work toward stated goals and plan of care.  [ ]  Patient understands intent and goals of therapy, but is neutral about his/her participation.  [ ]  Patient is unable to participate in goal setting and plan of care.  This patient???s plan of care is appropriate for delegation to OTA.    Thank you for this referral.  Lucretia Field, OT  Time Calculation: 19 mins

## 2012-09-11 NOTE — Progress Notes (Signed)
Pastoral support and listening presence with patient as he shared of his life journey, many years of marriage, and the impact on him of the death of his wife. Patient very appreciative of prayer and the support of church family. He is using his sense of humor as part of his coping.  Affirmed the ongoing availability of chaplains for support as part of the team.  Delaney Meigs, Chaplain, M.Div, [facility]  [phone]

## 2012-09-11 NOTE — Progress Notes (Signed)
[facility]  PROGRESS NOTE     09/11/2012  PCP: Jennye Boroughs, MD     Assessment/Plan:   Pt is a 79 y.o. male with a history of Afib, NASH and alcohol abuse who is admitted with falls, inability to walk and hypoglycemia.    Falls: Alcohol intoxication vs syncope vs hypoglycemia. Folate high and vitamin B12 wnl. XR lumbar spine with no acute findings. No new changes on EKG.   - PT/OT     Systolic CHF, not in acute exacerbation: Echo shows EF 35-40% with moderate diffuse hypokinesis. No studies to compare, and pt doesn't think has had an echo in the past.   - Cards consult, greatly appreciate recs  - Will start low dose ACEi. Consider starting low dose BB as well.     Alcohol abuse: Drinking 1 pint vodka daily. Last drink 3 days PTA. Able to quit in past with help of pastor and PCP. Got valium loading dose of 20mg .   - Continue CIWA protocol   - Goody bag daily   - Chaplain to follow while inpatient     Hypoglycemia: Resolved. BG 88-165 overnight. Likely 2/2 poor nutritional intake while drinking   - Accuchecks q6h with glucagon and glucose tabs PRN   - RD consult     NASH: Likely this is actually alcoholic liver disease. Currently asymptomatic   - No acute management necessary    FEN/Diet: Regular   Activity: Ambulate with assistance   DVT prophylaxis: Lovenox   Isolation precautions: None   Consultations: RD, CM, PT/OT, Chaplain   Anticipated disposition: Rehab vs HH   Code Status: FULL CODE      Subjective:   Pt was seen and examined at bedside. He reports no acute complaints and is still having back pain, improved with medications.     Objective:   Physical examination  BP 128/67   Pulse 90   Temp(Src) 97.7 ??F (36.5 ??C)   Resp 17   Ht 5\' 8"  (1.727 m)   Wt 150 lb (68.04 kg)   BMI 22.81 kg/m2   SpO2 100%   Temp (24hrs), Avg:98.1 ??F (36.7 ??C), Min:97.7 ??F (36.5 ??C), Max:98.4 ??F (36.9 ??C)         O2 Device: Room air      Intake/Output Summary (Last 24 hours) at 09/11/12 1209  Last  data filed at 09/11/12 5409   Gross per 24 hour   Intake 1491.67 ml   Output    250 ml   Net 1241.67 ml     Last shift:       Last 3 shifts:    08/12 1900 - 08/14 0659  In: 1491.7 [P.O.:240; I.V.:1251.7]  Out: 250 [Urine:250]    Gen: Pt lying in bed, in NAD  Head: Normocephalic, atraumatic  Eyes: Sclera anicteric, EOM grossly intact  ENT: MMM, adentulous  Neck: Supple  CVS: Normal S1, S2, no m/r/g  Resp: CTAB, no wheezes or rales  Abd: Soft, non-tender, non-distended, +BS  Extrem: Atraumatic, no cyanosis or edema  Pulses: 2+ throughout  Skin: Warm, dry  Neuro: Alert, appropriate      Data Review:     Recent Labs      09/11/12   0344  09/10/12   1143   WBC  7.9  8.5   HGB  14.4  15.6   HCT  41.4  45.2   PLT  220  258     Recent Labs  09/11/12   0344  09/10/12   2155  09/10/12   1143   NA  139   --   140   K  3.9   --   3.9   CL  103   --   99   CO2  25   --   19*   GLU  112*   --   92   BUN  23*   --   20   CREA  0.99   --   0.99   CA  9.0   --   9.3   ALB  3.3*  3.4*   --    SGOT  67*  68*   --    ALT  41  42   --      Medications reviewed  Current Facility-Administered Medications   Medication Dose Route Frequency   ??? sodium chloride (NS) flush 5-10 mL  5-10 mL IntraVENous Q8H   ??? sodium chloride (NS) flush 5-10 mL  5-10 mL IntraVENous PRN   ??? 0.9% sodium chloride 1,000 mL with mvi, adult no.4 with vit K 10 mL, thiamine 100 mg, folic acid 1 mg infusion   IntraVENous DAILY   ??? sodium chloride (NS) flush 5-10 mL  5-10 mL IntraVENous Q8H   ??? sodium chloride (NS) flush 5-10 mL  5-10 mL IntraVENous PRN   ??? ibuprofen (MOTRIN) tablet 400 mg  400 mg Oral Q4H PRN   ??? oxyCODONE-acetaminophen (PERCOCET) 5-325 mg per tablet 1 Tab  1 Tab Oral Q4H PRN   ??? enoxaparin (LOVENOX) injection 40 mg  40 mg SubCUTAneous Q24H   ??? LORazepam (ATIVAN) injection 2 mg  2 mg IntraVENous Q1H PRN   ??? LORazepam (ATIVAN) injection 4 mg  4 mg IntraVENous Q1H PRN   ??? glucose chewable tablet 16 g  4 Tab Oral PRN   ??? dextrose (D50W) injection syrg  12.5-25 g  12.5-25 g IntraVENous PRN   ??? glucagon (GLUCAGEN) injection 1 mg  1 mg IntraMUSCular PRN   ??? [DISCONTINUED] diazepam (VALIUM) tablet 20 mg  20 mg Oral Q1H   ??? [DISCONTINUED] diazepam (VALIUM) tablet 10 mg  10 mg Oral Q1H         Signed:   Windle Guard, MD   Resident, Family Medicine      Attending note:   Attending note to follow

## 2012-09-11 NOTE — Progress Notes (Signed)
I reviewed with the resident the medical history and the resident's findings on the physical examination.  I discussed with the resident the patient's diagnosis and concur with the plan.

## 2012-09-12 LAB — GLUCOSE, POC
Glucose (POC): 116 mg/dL — ABNORMAL HIGH (ref 75–110)
Glucose (POC): 92 mg/dL (ref 75–110)
Glucose (POC): 109 mg/dL (ref 75–110)
Glucose (POC): 74 mg/dL — ABNORMAL LOW (ref 75–110)
Glucose (POC): 76 mg/dL (ref 75–110)
Glucose (POC): 93 mg/dL (ref 75–110)

## 2012-09-12 LAB — METABOLIC PANEL, BASIC
Anion gap: 9 mmol/L (ref 5–15)
BUN/Creatinine ratio: 32 — ABNORMAL HIGH (ref 12–20)
BUN: 25 MG/DL — ABNORMAL HIGH (ref 6–20)
CO2: 25 mmol/L (ref 21–32)
Calcium: 8.5 MG/DL (ref 8.5–10.1)
Chloride: 106 mmol/L (ref 97–108)
Creatinine: 0.78 MG/DL (ref 0.45–1.15)
GFR est AA: >60 mL/min/{1.73_m2} (ref >60)
GFR est non-AA: >60 mL/min/{1.73_m2} (ref >60)
Glucose: 105 mg/dL — ABNORMAL HIGH (ref 65–100)
Potassium: 3.6 mmol/L (ref 3.5–5.1)
Sodium: 140 mmol/L (ref 136–145)

## 2012-09-12 LAB — CBC W/O DIFF
HCT: 38.3 % (ref 36.6–50.3)
HGB: 13.2 g/dL (ref 12.1–17.0)
MCH: 38 PG — ABNORMAL HIGH (ref 26.0–34.0)
MCHC: 34.5 g/dL (ref 30.0–36.5)
MCV: 110.4 FL — ABNORMAL HIGH (ref 80.0–99.0)
PLATELET: 199 10*3/uL (ref 150–400)
RBC: 3.47 M/uL — ABNORMAL LOW (ref 4.10–5.70)
RDW: 14 % (ref 11.5–14.5)
WBC: 6.1 10*3/uL (ref 4.1–11.1)

## 2012-09-12 LAB — STRESS TEST LEXISCAN
Diagnosis: NEGATIVE
Max. Diastolic BP: 71 mmHg
Max. Heart rate: 94 {beats}/min
Max. Systolic BP: 131 mmHg
Peak Ex METs: 1 METS

## 2012-09-12 MED ADMIN — lisinopril (PRINIVIL, ZESTRIL) tablet 5 mg: ORAL | @ 16:00:00 | NDC 00185540010

## 2012-09-12 MED ADMIN — sodium chloride (NS) flush 5-10 mL: INTRAVENOUS | @ 17:00:00 | NDC 82903065462

## 2012-09-12 MED ADMIN — sodium chloride (NS) flush 5-10 mL: INTRAVENOUS | @ 17:00:00 | NDC 87701099893

## 2012-09-12 MED ADMIN — regadenoson (LEXISCAN) injection 0.4 mg: INTRAVENOUS | @ 14:00:00 | NDC 00469650189

## 2012-09-12 MED ADMIN — enoxaparin (LOVENOX) injection 40 mg: SUBCUTANEOUS | @ 21:00:00 | NDC 00075801401

## 2012-09-12 MED ADMIN — 0.9% sodium chloride 1,000 mL with mvi, adult no.4 with vit K 10 mL, thiamine 100 mg, folic acid 1 mg infusion: INTRAVENOUS | @ 22:00:00 | NDC 63323018410

## 2012-09-12 MED ADMIN — sodium chloride (NS) flush 5-10 mL: INTRAVENOUS | @ 02:00:00 | NDC 87701099893

## 2012-09-12 MED ADMIN — sodium chloride (NS) flush 5-10 mL: INTRAVENOUS | @ 10:00:00 | NDC 87701099893

## 2012-09-12 NOTE — Progress Notes (Signed)
Problem: Mobility Impaired (Adult and Pediatric)  Goal: *Acute Goals and Plan of Care (Insert Text)  Physical Therapy Goals  Initiated 09/11/2012  1. Patient will move from supine to sit and sit to supine , scoot up and down and roll side to side in bed with independence within 7 day(s).   2. Patient will transfer from bed to chair and chair to bed with independence using the least restrictive device within 7 day(s).  3. Patient will perform sit to stand with independence within 7 day(s).  4. Patient will ambulate with modified independence for 200 feet with the least restrictive device within 7 day(s).   5. Patient will ascend/descend 4 stairs with handrail(s) with modified independence within 7 day(s).   PHYSICAL THERAPY TREATMENT  Patient: Dylan Haney (79 y.o. male)  Date: 09/12/2012  Diagnosis: Syncope  Syncope <principal problem not specified>       Precautions: Fall;Seizure;Skin      ASSESSMENT:  Patient motivated to participate in therapy this date.  Overall mod A x 1 for bed mobility and min/mod A for all other transfers.  Patient impulsive with transfers, especially pivot transfers, reaching out for furniture vs turning completely with RW.  Patient ambulated 15 feet x 2 trials in/out of bathroom using RW and mod A.  Patient with poor RW management although improved with max verbal cues and reminders.  Tends to allow RW to drift too far anterior.  Patient returned to supine with min A.  Patient will benefit from SNF placement at discharge.  Will follow up Monday if patient remains admitted.    Progression toward goals:  [ ]     Improving appropriately and progressing toward goals  [X]     Improving slowly and progressing toward goals  [ ]     Not making progress toward goals and plan of care will be adjusted       PLAN:  Patient continues to benefit from skilled intervention to address the above impairments.  Continue treatment per established plan of care.  Discharge Recommendations:  Skilled Nursing  Facility  Further Equipment Recommendations for Discharge:  TBD       SUBJECTIVE:   Patient stated ???I think I can walk in that bathroom.???      OBJECTIVE DATA SUMMARY:   Critical Behavior:  Neurologic State: Alert  Orientation Level: Oriented X4  Cognition: Follows commands;Poor safety awareness  Safety/Judgement: Awareness of environment;Insight into deficits  Functional Mobility Training:  Bed Mobility:  Rolling: Moderate assistance;Assist X1;Additional time;Verbal cues  Supine to Sit: Moderate assistance;Additional time;Verbal cues  Sit to Supine: Minimum assistance;Assist X1;Additional time;Verbal cues           Transfers:  Sit to Stand: Minimum assistance;Verbal cues;Additional time  Stand to Sit: Moderate assistance;Assist X1;Additional time;Verbal cues;Safety concerns  Stand Pivot Transfers: Verbal cues (mod A)                          Balance:  Sitting: Intact  Standing: Impaired  Standing - Static: Fair;Constant support  Standing - Dynamic : Poor  Ambulation/Gait Training:  Distance (ft): 15 Feet (ft) (x 2)  Assistive Device: Gait belt;Walker, rolling  Ambulation - Level of Assistance: Moderate assistance        Gait Abnormalities: Step to gait;Ataxic        Base of Support: Widened     Speed/Cadence: Slow;Fluctuations  Stairs:                       Neuro Re-Education:    Therapeutic Exercises:     Pain:  Pain Scale 1: Numeric (0 - 10)  Pain Intensity 1: 0              Activity Tolerance:   Improving  Please refer to the flowsheet for vital signs taken during this treatment.  After treatment:   [ ]     Patient left in no apparent distress sitting up in chair  [X]     Patient left in no apparent distress in bed  [X]     Call bell left within reach  [X]     Nursing notified  [ ]     Caregiver present  [X]     Bed alarm activated      COMMUNICATION/COLLABORATION:   The patient???s plan of care was discussed with: Registered Nurse    Rennis Harding, PT

## 2012-09-12 NOTE — Progress Notes (Signed)
Problem: Self Care Deficits Care Plan (Adult)  Goal: *Acute Goals and Plan of Care (Insert Text)  Occupational Therapy Goals  Initiated 09/11/2012  1. Patient will perform lower body dressing with minimal assistance/contact guard assist within 7 day(s).  2. Patient will perform toilet transfers with minimal assistance/contact guard assist within 7 day(s).  3. Patient will perform all aspects of toileting with minimal assistance/contact guard assist within 7 day(s).  4. Patient will participate in upper extremity therapeutic exercise/activities with independence for at least 8 minutes within 7 day(s).   5. Patient will utilize energy conservation techniques during functional activities with verbal cues within 7 day(s).      OCCUPATIONAL THERAPY TREATMENT  Patient: Dylan Haney (79 y.o. male)  Date: 09/12/2012  Diagnosis: Syncope  Syncope <principal problem not specified>       Precautions: Fall;Seizure;Skin  Chart, occupational therapy assessment, plan of care, and goals were reviewed.      ASSESSMENT:  After ambulation to the bathroom pt transferred to bedside commode over toilet with min assist x 2 for safety. Verbal cueing to use walker until he approached bedside commode rather than reaching for walker without support of both hands. Pt was able to void urine only. Pt needed min assist x 2 to return to bed. Recommend skilled nursing facility.  Progression toward goals:  [ ]           Improving appropriately and progressing toward goals  [X]           Improving slowly and progressing toward goals  [ ]           Not making progress toward goals and plan of care will be adjusted       PLAN:  Patient continues to benefit from skilled intervention to address the above impairments.  Continue treatment per established plan of care.  Discharge Recommendations: Skilled nursing facility  Further Equipment Recommendations for Discharge:  None       SUBJECTIVE:   Patient stated ???I guess I could try to use the bathroom.???       OBJECTIVE DATA SUMMARY:   Cognitive/Behavioral Status:  Neurologic State: Alert  Orientation Level: Oriented X4  Cognition: Follows commands;Poor safety awareness           Functional Mobility and Transfers for ADLs:              Bed Mobility:  Rolling: Moderate assistance;Assist X1;Additional time;Verbal cues  Supine to Sit: Moderate assistance;Additional time;Verbal cues  Sit to Supine: Minimum assistance;Assist X1;Additional time;Verbal cues                 Transfers:  Sit to Stand: Minimum assistance;Verbal cues;Additional time  Stand Pivot Transfers: Verbal cues (mod A)                                   Toilet Transfer : Minimum assistance (assist x 2 for safety)                                                              Balance:  Sitting: Intact  Standing: Impaired  Standing - Static: Fair;Constant support  Standing - Dynamic : Poor  Therapeutic Exercises:     EXERCISE  Sets   Reps   Active Active Assist   Passive   Comments   Shoulder flex/ext 1 5 [ ]            [X]            [ ]                Chest presses 1 10 [X]            [ ]            [ ]                Finger flex/ext 1 10 [X]            [ ]            [ ]                      [ ]            [ ]            [ ]                      [ ]            [ ]            [ ]                      [ ]            [ ]            [ ]                      [ ]            [ ]            [ ]                      [ ]            [ ]            [ ]                      [ ]            [ ]            [ ]                      [ ]            [ ]            [ ]                      [ ]            [ ]            [ ]                      Pain:  Pain Scale 1: Numeric (0 - 10)  Pain Intensity 1: 0                Activity Tolerance:    Fair  Please refer to the flowsheet for vital signs taken during this treatment.  After treatment:   [ ]   Patient left in no apparent distress sitting up in chair  [X]   Patient left in no apparent distress in bed  [X]   Call bell left within reach  [ ]   Nursing notified  [ ]    Caregiver present  [ ]   Bed alarm activated      COMMUNICATION/COLLABORATION:   The patient???s plan of care was discussed with: Physical Therapist and Occupational Therapist    Lurena Joiner A. Hite, COTA/L  Time Calculation: 25 mins

## 2012-09-12 NOTE — Progress Notes (Signed)
Problem: Falls - Risk of  Goal: *Absence of falls  Outcome: Progressing Towards Goal  Fall precautions in place. Bed in lowest position, side rails up, yellow slip resistant socks and band applied, yellow star outside door, bed alarm on, and personal belongings within reach. Pt instructed to use call bell when needing assistance.     Problem: Pressure Ulcer - Risk of  Goal: *Prevention of pressure ulcer  Outcome: Progressing Towards Goal  Patient is on mobility team and being turned Q 2 hrs. Patient has limited mobility and history of falls. Generalized bruising to body with left elbow skin tear within [place] and wound care instructions. No new skin breakdown noted.

## 2012-09-12 NOTE — Progress Notes (Addendum)
Dylan Altes, MD, [facility]  [facility]  [address]  Office [phone]      IMPRESSION and RECOMMENDATIONS     1. Cardiomyopathy: Etiology unknown, though I suspect EtOH given global hypokinesis. Start low-dose ACEI (lisinopril). No beta-blocker due to underlying conduction system disease. If lexiscan cardiolite unremarkable, then stable for discharge from a cardiac standpoint on low-dose lisinopril.    2. AFib: Intrinsically rate-controlled. Discussed CVA risk and anticoagulation, but the risks of anticoagulation may outweigh the benefits given alcoholism and very frequent falls.     I have arranged f/u with me 9/15 @ 3pm.      Addendum:  Lexiscan cardiolite showed no perfusion defects.  Thus, Pt stable for discharge from a cardiac standpoint.                            Subjective:       Pt without complaints.        Objective:   Patient Vitals for the past 16 hrs:   BP Temp Pulse Resp SpO2   09/12/12 0538 109/66 mmHg - 85 18 98 %   09/12/12 0244 114/78 mmHg 97.8 ??F (36.6 ??C) 87 17 99 %   09/11/12 2136 101/56 mmHg 98 ??F (36.7 ??C) 84 17 98 %       HEENT Exam:     WNL         Lung Exam:     The patient is not dyspneic.  Breath sounds are heard equally in all lung fields. There are no wheezes, rales, rhonchi, or rubs heard on auscultation.          Heart Exam:     The rhythm is regular. The PMI is in the 5th intercostal space of the MCL. Apical impulse is normal. S1 is regular. S2 is physiologic. There is no S3, S4 gallop, murmur, click, or rub.          Abdomen Exam:     Benign.         Extremities Exam:     No cyanosis, clubbing, edema.  Distal pulses intact.           Lab Results   Component Value Date/Time    Glucose 105 09/12/2012  5:48 AM    Sodium 140 09/12/2012  5:48 AM    Potassium 3.6 09/12/2012  5:48 AM    Chloride 106 09/12/2012  5:48 AM    CO2 25 09/12/2012  5:48 AM    BUN 25 09/12/2012  5:48 AM    Creatinine 0.78 09/12/2012  5:48 AM    Calcium 8.5  09/12/2012  5:48 AM     Recent Labs      09/12/12   0548  09/11/12   0344   WBC  6.1  7.9   HGB  13.2  14.4   HCT  38.3  41.4   PLT  199  220     Recent Labs      09/11/12   0344  09/10/12   2155   SGOT  67*  68*   ALT  41  42   AP  137*  129*   TBILI  1.5*  1.2*   TP  6.5  6.2*   ALB  3.3*  3.4*   GLOB  3.2  2.8     No results found for this basename: INR, PTP, APTT,  in the last 72 hours   Recent Labs  09/10/12   1143   CPK  472*     No results found for this basename: TROIQ,  in the last 72 hours  No results found for this basename: CHOL, CHOLX, CHOLP, CHLST, CHOLV, HDL, HDLC, HDLP, LDL, DLDL, LDLC, DLDLP, TGL, TGLX, TRIGL, TRIGP, CHHD, CHHDX     Echo:  SUMMARY:  Left ventricle: Systolic function was moderately reduced. Ejection  fraction was estimated in the range of 35 % to 40 %. There was moderate  diffuse hypokinesis.    Right ventricle: The ventricle was mildly dilated. Systolic function was  mildly reduced.    Left atrium: The atrium was dilated.    Right atrium: The atrium was moderately to markedly dilated.    Mitral valve: There was mild regurgitation.    Tricuspid valve: There was moderate to severe regurgitation. There was  moderate to severe pulmonary hypertension.    Pulmonic valve: There was moderate regurgitation.

## 2012-09-12 NOTE — Progress Notes (Addendum)
Pt was very interested in sharing various aspects of his situation.  Pt was given emotional and spiritual support. Today is his birthday and staff were alerted.  Pastoral Care continues to be available, please page as needed.      Hilbert Odor, MA, MTS, BCC, Staff [facility]

## 2012-09-12 NOTE — Progress Notes (Signed)
[facility]  PROGRESS NOTE     09/12/2012  PCP: Jennye Boroughs, MD     Assessment/Plan:   Pt is a 79 y.o. male with a history of Afib, NASH and alcohol abuse who is admitted with falls, inability to walk and hypoglycemia.    Falls: Most likely 2/2 alochol intoxication. No further episodes since arrival. Folate high and vitamin B12 wnl. XR lumbar spine with no acute findings. No new changes on EKG.   - PT/OT     Systolic CHF, not in acute exacerbation: Echo shows EF 35-40% with moderate diffuse hypokinesis. No studies to compare, and pt doesn't think has had an echo in the past. Lexiscan wnl.   - Cards following, greatly appreciate recs: stable for d/c from cardiac standpoint. Will f/u in 1 month.   - Continue lisinopril 5mg  daily    Alcohol abuse: Drinking 1 pint vodka daily. Last drink 3 days PTA. Able to quit in past with help of pastor and PCP. Got valium loading dose of 20mg . CIWA ranged 0-1 in last 24h.   - Continue CIWA protocol   - Goody bag daily   - Chaplain to follow while inpatient     Hypoglycemia: Resolved. BG 88-116 overnight. Likely 2/2 poor nutritional intake while drinking   - Will d/c scheduled accuchecks and do only prn if pt symptomatic     NASH: Likely this is actually alcoholic liver disease. Currently asymptomatic   - No acute management necessary    FEN/Diet: Regular   Activity: Ambulate with assistance   DVT prophylaxis: Lovenox   Isolation precautions: None   Consultations: RD, CM, PT/OT, Chaplain   Anticipated disposition: Rehab vs HH   Code Status: FULL CODE      Subjective:   Pt was seen and examined at bedside. He reports no acute complaints overnight. Today is his birthday.      Objective:   Physical examination  BP 112/62   Pulse 83   Temp(Src) 97.4 ??F (36.3 ??C)   Resp 17   Ht 5\' 8"  (1.727 m)   Wt 150 lb (68.04 kg)   BMI 22.81 kg/m2   SpO2 92%   Temp (24hrs), Avg:97.3 ??F (36.3 ??C), Min:96 ??F (35.6 ??C), Max:98 ??F (36.7 ??C)         O2 Device: Room air       Intake/Output Summary (Last 24 hours) at 09/12/12 1517  Last data filed at 09/12/12 1147   Gross per 24 hour   Intake      0 ml   Output    350 ml   Net   -350 ml     Last shift:    08/15 0700 - 08/15 1859  In: -   Out: 350 [Urine:350]  Last 3 shifts:    08/13 1900 - 08/15 0659  In: 1491.7 [P.O.:240; I.V.:1251.7]  Out: 250 [Urine:250]    Gen: Pt lying in bed, in NAD  Head: Normocephalic, atraumatic  Eyes: Sclera anicteric, EOM grossly intact  ENT: MMM, adentulous  Neck: Supple  CVS: Normal S1, S2, no m/r/g  Resp: CTAB, no wheezes or rales  Abd: Soft, non-tender, non-distended, +BS  Extrem: Atraumatic, no cyanosis or edema  Pulses: 2+ throughout  Skin: Warm, dry  Neuro: Alert, appropriate      Data Review:     Recent Labs      09/12/12   0548  09/11/12   0344  09/10/12   1143   WBC  6.1  7.9  8.5   HGB  13.2  14.4  15.6   HCT  38.3  41.4  45.2   PLT  199  220  258     Recent Labs      09/12/12   0548  09/11/12   0344  09/10/12   2155  09/10/12   1143   NA  140  139   --   140   K  3.6  3.9   --   3.9   CL  106  103   --   99   CO2  25  25   --   19*   GLU  105*  112*   --   92   BUN  25*  23*   --   20   CREA  0.78  0.99   --   0.99   CA  8.5  9.0   --   9.3   ALB   --   3.3*  3.4*   --    SGOT   --   67*  68*   --    ALT   --   41  42   --      Medications reviewed  Current Facility-Administered Medications   Medication Dose Route Frequency   ??? [COMPLETED] regadenoson (LEXISCAN) injection 0.4 mg  0.4 mg IntraVENous RAD ONCE   ??? lisinopril (PRINIVIL, ZESTRIL) tablet 5 mg  5 mg Oral DAILY   ??? sodium chloride (NS) flush 5-10 mL  5-10 mL IntraVENous Q8H   ??? sodium chloride (NS) flush 5-10 mL  5-10 mL IntraVENous PRN   ??? 0.9% sodium chloride 1,000 mL with mvi, adult no.4 with vit K 10 mL, thiamine 100 mg, folic acid 1 mg infusion   IntraVENous DAILY   ??? sodium chloride (NS) flush 5-10 mL  5-10 mL IntraVENous Q8H   ??? sodium chloride (NS) flush 5-10 mL  5-10 mL IntraVENous PRN   ??? ibuprofen (MOTRIN) tablet 400 mg  400  mg Oral Q4H PRN   ??? oxyCODONE-acetaminophen (PERCOCET) 5-325 mg per tablet 1 Tab  1 Tab Oral Q4H PRN   ??? enoxaparin (LOVENOX) injection 40 mg  40 mg SubCUTAneous Q24H   ??? LORazepam (ATIVAN) injection 2 mg  2 mg IntraVENous Q1H PRN   ??? LORazepam (ATIVAN) injection 4 mg  4 mg IntraVENous Q1H PRN   ??? glucose chewable tablet 16 g  4 Tab Oral PRN   ??? dextrose (D50W) injection syrg 12.5-25 g  12.5-25 g IntraVENous PRN   ??? glucagon (GLUCAGEN) injection 1 mg  1 mg IntraMUSCular PRN         Signed:   Windle Guard, MD   Resident, Family Medicine      Attending note:   Attending note to follow

## 2012-09-12 NOTE — Progress Notes (Addendum)
IP consult to discharge pt to SNF noted. I met with the pt to assess for discharge needs. Pt lives alone in a single story house with 3 steps at the entry way. Pt was independent in his ADLs and uses a rolling walker at home. PCP is Dr. Jean Rosenthal. Pt has prescription coverage and uses [facility] pharmacy to fill is prescriptions. Pt has had home health before but does not remember the name of the home health agency. Pt verbalized that he prefers to go to [facility] for rehab. Referral via [facility] sent to [facility] with pt's clinicals. Pt will need to stay tonight to qualify for the 3 night stay. Pt may require authorization from his insurance and may not be able to go to the SNF until Monday. Attempted to reach pt's daughter to discuss pt's discharge disposition but unable to get in touch with her.      Elenora Fender RN

## 2012-09-12 NOTE — Progress Notes (Signed)
Bedside and Verbal shift change report given to Helen, RN (oncoming nurse) by Anaily Ashbaugh, RN (offgoing nurse). Report included the following information SBAR, Kardex, Intake/Output and MAR.

## 2012-09-12 NOTE — Progress Notes (Signed)
Bedside and Verbal shift change report given to Tamara, RN (oncoming nurse) by Maggie, RN (offgoing nurse). Report included the following information SBAR, Kardex, Intake/Output, MAR and Recent Results.

## 2012-09-12 NOTE — Progress Notes (Signed)
I reviewed with the resident the medical history and the resident's findings on the physical examination.  I discussed with the resident the patient's diagnosis and concur with the plan.

## 2012-09-12 NOTE — Progress Notes (Signed)
Problem: Patient Education: Go to Patient Education Activity  Goal: Patient/Family Education  Outcome: Progressing Towards Goal  Patient's banana bag was hung, and patient's CIWA was scored. He scored 0. Patient has been on seizure precautions. He has been resting comfortably in bed and verbalizes no needs or concerns at this time.

## 2012-09-12 NOTE — Progress Notes (Signed)
Bedside and Verbal shift change report given to Lauren RN (oncoming nurse) by Tamara RN (offgoing nurse). Report included the following information SBAR, Kardex, MAR and Recent Results.

## 2012-09-12 NOTE — Progress Notes (Signed)
Bedside and Verbal shift change report given to Maggie, RN (oncoming nurse) by Chris, RN (offgoing nurse). Report included the following information SBAR, Kardex, MAR and Recent Results.

## 2012-09-13 LAB — CBC W/O DIFF
HCT: 38.3 % (ref 36.6–50.3)
HGB: 13 g/dL (ref 12.1–17.0)
MCH: 38.2 PG — ABNORMAL HIGH (ref 26.0–34.0)
MCHC: 33.9 g/dL (ref 30.0–36.5)
MCV: 112.6 FL — ABNORMAL HIGH (ref 80.0–99.0)
PLATELET: 179 10*3/uL (ref 150–400)
RBC: 3.4 M/uL — ABNORMAL LOW (ref 4.10–5.70)
RDW: 14.1 % (ref 11.5–14.5)
WBC: 6.5 10*3/uL (ref 4.1–11.1)

## 2012-09-13 LAB — METABOLIC PANEL, BASIC
Anion gap: 9 mmol/L (ref 5–15)
BUN/Creatinine ratio: 28 — ABNORMAL HIGH (ref 12–20)
BUN: 21 MG/DL — ABNORMAL HIGH (ref 6–20)
CO2: 26 mmol/L (ref 21–32)
Calcium: 8.6 MG/DL (ref 8.5–10.1)
Chloride: 105 mmol/L (ref 97–108)
Creatinine: 0.74 MG/DL (ref 0.45–1.15)
GFR est AA: >60 mL/min/{1.73_m2} (ref >60)
GFR est non-AA: >60 mL/min/{1.73_m2} (ref >60)
Glucose: 99 mg/dL (ref 65–100)
Potassium: 3.9 mmol/L (ref 3.5–5.1)
Sodium: 140 mmol/L (ref 136–145)

## 2012-09-13 LAB — GLUCOSE, POC
Glucose (POC): 95 mg/dL (ref 75–110)
Glucose (POC): 100 mg/dL (ref 75–110)

## 2012-09-13 MED ADMIN — sodium chloride (NS) flush 5-10 mL: INTRAVENOUS | @ 10:00:00 | NDC 87701099893

## 2012-09-13 MED ADMIN — sodium chloride (NS) flush 5-10 mL: INTRAVENOUS | @ 18:00:00 | NDC 87701099893

## 2012-09-13 MED ADMIN — lisinopril (PRINIVIL, ZESTRIL) tablet 5 mg: ORAL | @ 13:00:00 | NDC 00185540010

## 2012-09-13 MED ADMIN — 0.9% sodium chloride 1,000 mL with mvi, adult no.4 with vit K 10 mL, thiamine 100 mg, folic acid 1 mg infusion: INTRAVENOUS | @ 21:00:00 | NDC 63323018410

## 2012-09-13 MED ADMIN — enoxaparin (LOVENOX) injection 40 mg: SUBCUTANEOUS | @ 21:00:00 | NDC 00075801401

## 2012-09-13 MED ADMIN — sodium chloride (NS) flush 5-10 mL: INTRAVENOUS | @ 17:00:00 | NDC 87701099893

## 2012-09-13 MED ADMIN — sodium chloride (NS) flush 5-10 mL: INTRAVENOUS | @ 02:00:00 | NDC 87701099893

## 2012-09-13 NOTE — Progress Notes (Signed)
Bedside and Verbal shift change report given to Julie RN (oncoming nurse) by Tora RN (offgoing nurse). Report included the following information SBAR, Kardex, MAR, Recent Results and Med Rec Status.

## 2012-09-13 NOTE — Progress Notes (Signed)
Pt's daughter was present in pt's room and very supportive of him.  Pt indicated an interest in an AMD.  His daughter is to call tomorrow when she will be with him to assist.  Pastoral Care continues to be available, please page as needed.      Hilbert Odor, MA, MTS, BCC, Staff [facility]

## 2012-09-13 NOTE — Progress Notes (Signed)
Pt's daughter has arrived.  Reports that MD has requested to speak her.  Paged Family Practice, spoke with Dr. Cathie Hoops.  Reports that they will be up to speak with pt's daughter in about  30 min.  Pt and daughter informed.

## 2012-09-13 NOTE — Progress Notes (Signed)
Bedside and Verbal shift change report given to Tora S.(oncoming nurse) by Francie Massing (offgoing nurse). Report included the following information SBAR, Kardex, Intake/Output, MAR and Recent Results.

## 2012-09-13 NOTE — Progress Notes (Signed)
[facility]  PROGRESS NOTE     09/13/2012  PCP: Jennye Boroughs, MD     Assessment/Plan:   Pt is a 79 y.o. male with a history of Afib, NASH and alcohol abuse who is admitted with falls, inability to walk and hypoglycemia.    Falls: Most likely 2/2 alochol intoxication. No further episodes since arrival. Folate high and vitamin B12 wnl. XR lumbar spine with no acute findings. No new changes on EKG.   - PT/OT   - Will need SNF, pending insurance approval.   HH at minimum.       Systolic CHF, not in acute exacerbation: Echo shows EF 35-40% with moderate diffuse hypokinesis. No studies to compare, and pt doesn't think has had an echo in the past. Lexiscan wnl.   - Cards following, greatly appreciate recs: stable for d/c from cardiac standpoint. Will f/u in 1 month.   - Continue lisinopril 5mg  daily    Alcohol abuse: Drinking 1 pint vodka daily. Last drink 3 days PTA. Able to quit in past with help of pastor and PCP. Got valium loading dose of 20mg . No Ativan needed in the past 24hrs.  CIWA 0.    - Continue CIWA protocol   - Goody bag daily   - Chaplain to follow while inpatient     Hypoglycemia: Resolved. BG 90s-100s overnight. Likely 2/2 poor nutritional intake while drinking   - Will d/c scheduled accuchecks and do only prn if pt symptomatic     NASH: Likely this is actually alcoholic liver disease. Currently asymptomatic   - No acute management necessary    FEN/Diet: Regular   Activity: Ambulate with assistance   DVT prophylaxis: Lovenox   Isolation precautions: None   Consultations: RD, CM, PT/OT, Chaplain   Anticipated disposition: Rehab vs HH   Code Status: FULL CODE      Subjective:   Pt was seen and examined at bedside. He reports no acute complaints overnight.  No current complaints of chest pain, palpitations, abdominal pain, trouble breathing, nausea, vomiting or diarrhea.        Objective:   Physical examination  BP 131/67   Pulse 86   Temp(Src) 98.7 ??F (37.1 ??C)   Resp 18    Ht 5\' 8"  (1.727 m)   Wt 168 lb 6.4 oz (76.386 kg)   BMI 25.61 kg/m2   SpO2 98%   Temp (24hrs), Avg:98.4 ??F (36.9 ??C), Min:97.8 ??F (36.6 ??C), Max:99 ??F (37.2 ??C)         O2 Device: Room air      Intake/Output Summary (Last 24 hours) at 09/13/12 1855  Last data filed at 09/13/12 1040   Gross per 24 hour   Intake   1360 ml   Output    700 ml   Net    660 ml     Last shift:    08/16 0700 - 08/16 1859  In: 360 [P.O.:360]  Out: 250 [Urine:250]  Last 3 shifts:    08/14 1900 - 08/16 0659  In: 1280 [P.O.:480; I.V.:800]  Out: 1040 [Urine:1040]    Gen: Pt lying in bed, in NAD  Head: Normocephalic, atraumatic  Eyes: Sclera anicteric, EOM grossly intact  ENT: MMM, adentulous  Neck: Supple  CVS: Normal S1, S2, no m/r/g  Resp: CTAB, no wheezes or rales  Abd: Soft, non-tender, non-distended, +BS  Extrem: Atraumatic, no cyanosis or edema  Pulses: 2+ throughout  Skin: Warm, dry  Neuro: Alert, appropriate  Data Review:     Recent Labs      09/13/12   0320  09/12/12   0548  09/11/12   0344   WBC  6.5  6.1  7.9   HGB  13.0  13.2  14.4   HCT  38.3  38.3  41.4   PLT  179  199  220     Recent Labs      09/13/12   0320  09/12/12   0548  09/11/12   0344  09/10/12   2155   NA  140  140  139   --    K  3.9  3.6  3.9   --    CL  105  106  103   --    CO2  26  25  25    --    GLU  99  105*  112*   --    BUN  21*  25*  23*   --    CREA  0.74  0.78  0.99   --    CA  8.6  8.5  9.0   --    ALB   --    --   3.3*  3.4*   SGOT   --    --   67*  68*   ALT   --    --   41  42     Medications reviewed  Current Facility-Administered Medications   Medication Dose Route Frequency   ??? lisinopril (PRINIVIL, ZESTRIL) tablet 5 mg  5 mg Oral DAILY   ??? sodium chloride (NS) flush 5-10 mL  5-10 mL IntraVENous Q8H   ??? sodium chloride (NS) flush 5-10 mL  5-10 mL IntraVENous PRN   ??? 0.9% sodium chloride 1,000 mL with mvi, adult no.4 with vit K 10 mL, thiamine 100 mg, folic acid 1 mg infusion   IntraVENous DAILY   ??? sodium chloride (NS) flush 5-10 mL  5-10 mL  IntraVENous Q8H   ??? sodium chloride (NS) flush 5-10 mL  5-10 mL IntraVENous PRN   ??? ibuprofen (MOTRIN) tablet 400 mg  400 mg Oral Q4H PRN   ??? oxyCODONE-acetaminophen (PERCOCET) 5-325 mg per tablet 1 Tab  1 Tab Oral Q4H PRN   ??? enoxaparin (LOVENOX) injection 40 mg  40 mg SubCUTAneous Q24H   ??? LORazepam (ATIVAN) injection 2 mg  2 mg IntraVENous Q1H PRN   ??? LORazepam (ATIVAN) injection 4 mg  4 mg IntraVENous Q1H PRN   ??? glucose chewable tablet 16 g  4 Tab Oral PRN   ??? dextrose (D50W) injection syrg 12.5-25 g  12.5-25 g IntraVENous PRN   ??? glucagon (GLUCAGEN) injection 1 mg  1 mg IntraMUSCular PRN         Signed:   Oren Binet, MD   Resident, Family Medicine      Attending note:   Attending note to follow

## 2012-09-13 NOTE — Progress Notes (Signed)
Care Management:  CM was informed by Dr. Cathie Hoops that patient is medically cleared for discharge pending insurance auth and SNF bed availability.  CM  received request to meet with patient and his daughter, Roxy Horseman [phone], regarding post hospital care arrangements.  Patient and his daughter confirmed that patient was independent prior to admission and are in hopes that patient could go to [name] for short term rehab stay prior to returning home.CM forwarded update clinical info to [name] via [employer] with request to continue pursuit of insurance authorization.  CM anticipates that response from insurance company will not be received until Monday 8/18.    Patient and his daughter stated desire for patient to complete Advanced Medical Directive.  CM contacted hospital Chaplain who met with patient and his daughter to coordinate a time to meet.  CM to follow for support and further assist with post hospital care needs.  Thank you    Earnest Bailey  SW  CDP [phone]

## 2012-09-13 NOTE — Progress Notes (Signed)
I reviewed with the resident the medical history and the resident's findings on the physical examination.  I discussed with the resident the patient's diagnosis and concur with the plan.

## 2012-09-14 MED ADMIN — sodium chloride (NS) flush 5-10 mL: INTRAVENOUS | @ 18:00:00 | NDC 87701099893

## 2012-09-14 MED ADMIN — lisinopril (PRINIVIL, ZESTRIL) tablet 5 mg: ORAL | @ 14:00:00 | NDC 00185540010

## 2012-09-14 MED ADMIN — sodium chloride (NS) flush 5-10 mL: INTRAVENOUS | @ 02:00:00 | NDC 87701099893

## 2012-09-14 MED ADMIN — enoxaparin (LOVENOX) injection 40 mg: SUBCUTANEOUS | @ 18:00:00 | NDC 00075801401

## 2012-09-14 MED ADMIN — sodium chloride (NS) flush 5-10 mL: INTRAVENOUS | @ 10:00:00 | NDC 87701099893

## 2012-09-14 MED ADMIN — 0.9% sodium chloride 1,000 mL with mvi, adult no.4 with vit K 10 mL, thiamine 100 mg, folic acid 1 mg infusion: INTRAVENOUS | @ 22:00:00 | NDC 63323018410

## 2012-09-14 NOTE — Progress Notes (Signed)
Problem: Impaired Skin Integrity/Pressure Ulcer Treatment  Goal: *Improvement of existing pressure ulcer  Outcome: Progressing Towards Goal  Patient has been resting comfortably in bed. He does not verbalizes any needs or complaints. His CIWA was done (scored zero). Wound care was done and his dressings have been replaced.

## 2012-09-14 NOTE — Progress Notes (Signed)
[facility] Medicine Residency [facility] with [facility]  [address]  [place], [place] [address]   Office [phone], Fax [phone]      Family Medicine Daily Progress Note: 09/14/2012      Assessment/Plan:   Pt is a 79 y.o. male with a history of Afib, NASH and alcohol abuse who is admitted with falls, inability to walk and hypoglycemia.     Falls: Most likely 2/2 alochol intoxication. No further episodes since arrival. Folate high and vitamin B12 wnl. XR lumbar spine with no acute findings. No new changes on EKG.   - Will need SNF, pending insurance approval. HH at minimum.     Systolic CHF, not in acute exacerbation: Echo shows EF 35-40% with moderate diffuse hypokinesis. No studies to compare, and pt doesn't think has had an echo in the past. Lexiscan wnl.   - Lexiscan: Subjectively and objectively negative lexiscan infusion, Normal myocardial perfusion,  Moderate left ventricular systolic dysfunction (EF = 43%) with moderate   inferior hypokinesis and septal akinesis vs paradoxical septal motion, Negative lexiscan Sestamibi study for inducible ischemia.  - Cards following, greatly appreciate recs: stable, f/u Cards in 1 month  - Continue lisinopril 5mg  daily     Alcohol abuse: Drinking 1 pint vodka daily. Last drink 3 days PTA. Able to quit in past with help of pastor and PCP. Got valium loading dose of 20mg . No Ativan needed in the past 48hrs. CIWA 0.   - Continue CIWA protocol   - Goody bag daily   - Chaplain to follow while inpatient     Hypoglycemia: Resolved. BG 90s-100s overnight. Likely 2/2 poor nutritional intake while drinking   - Will d/c scheduled accuchecks and do only prn if pt symptomatic     NASH: Likely this is actually alcoholic liver disease. Currently asymptomatic   - No acute management necessary     FEN/Diet: Regular   Activity: Ambulate with assistance   DVT prophylaxis: Lovenox   Isolation precautions: None   Consultations: RD, CM, PT/OT, Chaplain    Anticipated disposition: SNF Monday 8/18  Code Status: FULL CODE        Subjective:   Pt resting comfortably today watching TV, denies any constitutional symptoms.  Ready for discharge to a SNF.  Had a BM today, urinating without adverse symptoms.    Admits to ambulation with walker and assistance, without ill events.    Review of Systems:   Review of Systems - History obtained from chart review and the patient  General ROS: negative for - chills, fatigue, fever or night sweats  Respiratory ROS: no cough, shortness of breath, or wheezing  Cardiovascular ROS: no chest pain or dyspnea on exertion  Gastrointestinal ROS: no abdominal pain, change in bowel habits, or black or bloody stools  Genito-Urinary ROS: no dysuria, trouble voiding, or hematuria  Musculoskeletal ROS: negative for - muscular weakness    Objective:     Physical examination  Patient Vitals for the past 12 hrs:   BP Temp Pulse Resp SpO2   09/14/12 1525 137/67 mmHg 98 ??F (36.7 ??C) 77 17 95 %   09/14/12 1024 132/65 mmHg 97.7 ??F (36.5 ??C) 85 19 97 %   09/14/12 0703 129/68 mmHg 97.3 ??F (36.3 ??C) 86 18 94 %     Temp (24hrs), Avg:97.9 ??F (36.6 ??C), Min:97.3 ??F (36.3 ??C), Max:98.7 ??F (37.1 ??C)      Intake/Output Summary (Last 24 hours) at 09/14/12 1545  Last data filed at 09/14/12 1202   Gross per 24 hour   Intake    990 ml   Output    325 ml   Net    665 ml          O2 Device: Room air     General:   Alert, cooperative, no acute distress   Head:   Atraumatic   Eyes:   Conjunctivae clear   ENT:  Oral mucosa normal   Neck:  Supple, trachea midline, no adenopathy   No JVD   Chest wall:    No tenderness or deformities    Lungs:   Clear to auscultation bilaterally    Heart:   Regular rhythm, no murmur, extra beats auscultated, no mumurs/gallops    Abdomen:    Soft, non-tender   No masses or organomegaly    Extremities:  R arm ecchymosis and excoriations stable, No edema or DVT signs, no palpable cords   Pulses:  Symmetric all extremities   Skin:  Warm and dry     No rashes or lesions   Neurologic:  Oriented   No focal deficits   Psychiatric:  normal insight; pleasant affect, acceptance/ready to go to SNF         Data Review:       Recent Labs      09/13/12   0320  09/12/12   0548   WBC  6.5  6.1   HGB  13.0  13.2   HCT  38.3  38.3   PLT  179  199     Recent Labs      09/13/12   0320  09/12/12   0548   NA  140  140   K  3.9  3.6   CL  105  106   CO2  26  25   GLU  99  105*   BUN  21*  25*   CREA  0.74  0.78   CA  8.6  8.5     Medications reviewed  Current Facility-Administered Medications   Medication Dose Route Frequency   ??? lisinopril (PRINIVIL, ZESTRIL) tablet 5 mg  5 mg Oral DAILY   ??? sodium chloride (NS) flush 5-10 mL  5-10 mL IntraVENous Q8H   ??? sodium chloride (NS) flush 5-10 mL  5-10 mL IntraVENous PRN   ??? 0.9% sodium chloride 1,000 mL with mvi, adult no.4 with vit K 10 mL, thiamine 100 mg, folic acid 1 mg infusion   IntraVENous DAILY   ??? sodium chloride (NS) flush 5-10 mL  5-10 mL IntraVENous Q8H   ??? sodium chloride (NS) flush 5-10 mL  5-10 mL IntraVENous PRN   ??? ibuprofen (MOTRIN) tablet 400 mg  400 mg Oral Q4H PRN   ??? oxyCODONE-acetaminophen (PERCOCET) 5-325 mg per tablet 1 Tab  1 Tab Oral Q4H PRN   ??? enoxaparin (LOVENOX) injection 40 mg  40 mg SubCUTAneous Q24H   ??? LORazepam (ATIVAN) injection 2 mg  2 mg IntraVENous Q1H PRN   ??? LORazepam (ATIVAN) injection 4 mg  4 mg IntraVENous Q1H PRN   ??? glucose chewable tablet 16 g  4 Tab Oral PRN   ??? dextrose (D50W) injection syrg 12.5-25 g  12.5-25 g IntraVENous PRN   ??? glucagon (GLUCAGEN) injection 1 mg  1 mg IntraMUSCular PRN       Total time spent 30-74 minutes    Signed:   Gilles Chiquito   Resident, Family Medicine  Attending note:   To Follow

## 2012-09-14 NOTE — Progress Notes (Signed)
Patient resting comfortably in bed. He verbalizes no needs or concerns at this time. Patient has urinating without difficulty difficulty. Wound care performed. Silicone dressing on left elbow was changed.

## 2012-09-14 NOTE — Progress Notes (Signed)
I reviewed with the resident the medical history and the resident's findings on the physical examination.  I discussed with the resident the patient's diagnosis and concur with the plan.

## 2012-09-14 NOTE — Other (Signed)
Bedside shift change report given to Lauren RN (oncoming nurse) by Julie RN (offgoing nurse). Report included the following information SBAR, Kardex, Intake/Output, MAR and Recent Results.

## 2012-09-14 NOTE — Progress Notes (Signed)
Bedside and Verbal shift change report given to Julie, RN (oncoming nurse) by Alexianna Nachreiner, RN (offgoing nurse). Report included the following information SBAR, Kardex, Intake/Output and MAR.

## 2012-09-14 NOTE — Progress Notes (Signed)
Pastoral support and prayer with patient and daughter Eunice Blase.  Requested to assist with Advance Medical Directive. Explained Advance Medical Directive to patient and Eunice Blase.  Assisted patient in completing the document. Made copy for patient's chart and handed it to [facility], Licensed conveyancer. Returned original document to the patient.  Delaney Meigs, Chaplain, M.Div, [facility]  [phone]

## 2012-09-15 MED ADMIN — 0.9% sodium chloride 1,000 mL with mvi, adult no.4 with vit K 10 mL, thiamine 100 mg, folic acid 1 mg infusion: INTRAVENOUS | @ 23:00:00 | NDC 63323018410

## 2012-09-15 MED ADMIN — sodium chloride (NS) flush 5-10 mL: INTRAVENOUS | @ 18:00:00 | NDC 87701099893

## 2012-09-15 MED ADMIN — lisinopril (PRINIVIL, ZESTRIL) tablet 5 mg: ORAL | @ 14:00:00 | NDC 00185540010

## 2012-09-15 MED ADMIN — sodium chloride (NS) flush 5-10 mL: INTRAVENOUS | @ 18:00:00 | NDC 82903065462

## 2012-09-15 MED ADMIN — sodium chloride (NS) flush 5-10 mL: INTRAVENOUS | @ 02:00:00 | NDC 87701099893

## 2012-09-15 MED ADMIN — sodium chloride (NS) flush 5-10 mL: INTRAVENOUS | @ 10:00:00 | NDC 87701099893

## 2012-09-15 MED ADMIN — enoxaparin (LOVENOX) injection 40 mg: SUBCUTANEOUS | @ 21:00:00 | NDC 00075801401

## 2012-09-15 NOTE — Other (Addendum)
Cardiac Rehab: 79 yo male admitted with syncope and recurrent falls, due to ETOH abuse, poor nutritional intake and hypoglycemia (8/13). S/P normal stress test (8/15). Cardiovascular history includes: Cardiomyopathy, chronic systolic CHF, and chronic AFib. Patient is a former smoker. LVEF 35-40% by echo (09/11/12), with BAE, mod-severe TR, moderate PR, and mod-severe pulmonary HTN.  Patient has been residing in [facility]; next-of-kin is daughter [name]). PCP is Dr Zebedee Iba. Pt will be followed for cardiology by Dr Elmon Kirschner.     09/15/2012 Met with patient, who was sitting up in bed on 5th floor med-surg unit. Patient oriented to person, place "[employer]" and day of week. Explained educational role of Cardiac Rehab RN. Discussed patient's understanding of his current cardiac condition and tx plan. Pt indicated he was told his heart was "doing okay." Explained that his heart was enlarged and weak. He verbalized awareness that he needs to stop drinking. Pt was unaware of heart valve regurgitation. Gave/reviewed CHF teaching packet. Discussed plan for discharge to [facility]. Provided handouts on AFib, how the heart works, and learning about ACE inhibitors for heart failure.     Core Measures (if not met include reason why)  ACE/ARB - started on lisinopril   BB - None (due to conduction disease)   Statin - None  ASA -  81 mg.     Diet education: Provided handout on Low Sodium diet (<1500 mg Na/day), and explained rational for limiting salt intake. Pt will be discharged to SNF where his meals will be prepared for him.     Fluid restriction education: Fluid intake of 6 cups (48 oz) per day is recommended, with max intake of 64 oz per day, unless MD specifies otherwise. Encouraged pt to abstain from alcohol.    Daily Weights: Pt will discharged to SNF where he will be monitored for s/s of fluid overload. Discussed rationale for monitoring daily wts and contacting MD if gains 3 lbs in one day, or 5 lbs in  one week.    Smoking cessation: Nonsmoker.    Medication Education: Provided copy of med rec hx. Pt stated, "I don't know what medicines I take."    New scheduled PO meds this admission: Provided Micromedex handout on lisinopril.    Concern for knowledge deficit: Patient has intermittent confusion and poor memory. He verbalized basic understanding of info provided; however, he will need ongoing reinforcement on all topics discussed. Recommend teaching with family present, due to patient's cognitive limitations.     Update at 1230: Spoke to daughter [name]) on telehone [phone]) to answer questions and review CHF teaching. Dgt reported pt had been living independently and driving prior to admission. She will be assisting pt as needed. Pt also has 2 sons, one in [place] and one in [place]. Explained CM, EF, and negative impact of ETOH on pt's heart. Reviewed importance of daily wts, heart healthy/low sodium diet, regular f/u with MD, and taking meds as ordered. Also explained AFib, increased risk for CVA/PE, and reason pt was not being started on blood thinner (frequent falls). Discussed purpose of new PO med: lisinopril. Informed daughter of f/u appt with Dr Elmon Kirschner and encouraged dgt to go to MD appt with pt. Dgt verbalized understanding of info provided, and all questions were answered.     1.  During this hospital stay did staff take your preferences and those of your family (or caregiver) into account in deciding your individual heart failure needs, and in deciding when you left the  hospital? YES.  2.  Do you have a good understanding of the things you are responsible for in managing heart failure? PT NEEDS REINFORCEMENT.  PER DGT, YES.  3.  Do you clearly understand the purpose for taking each medication? NEEDS SUPERVISION WITH MEDS.

## 2012-09-15 NOTE — Discharge Summary (Addendum)
[facility] Medicine Residency [facility] with [facility]  [address]  [place], [place] [address]   Office [phone], Fax [phone]        Discharge Summary     Patient: Dylan Haney       MRN: [id number]       Date of birth: 1924/11/02       Age: 79 y.o.     Date of admission:  09/10/2012    Date of discharge:  09/15/2012    Primary care provider:  Jennye Boroughs, MD     Admitting provider:  Narda Rutherford, MD    Discharging provider(s): Gilles Chiquito - Family Medicine Resident  Dr. Honor Junes, MD - Family Medicine Attending     Consultations  IP CONSULT TO FAMILY PRACTICE  IP CONSULT TO CARDIOLOGY    Procedures  ?? 2-D TTE ECHO  ?? NM Lexiscan Stress Test    Discharge destination: [name].  The patient is stable for discharge.    Admission diagnosis  Syncope  Syncope    Admission HPI per admitting provider:  Mr. Sheard is a 79 y.o. male with a history of Afib, NASH and alcohol abuse who is admitted with falls, inability to walk. Pt reports several falls over the past few weeks, happen while walking, pt unsure of circumstances leading up to falls. Per daughter, pt is passing out and falling 2/2 alcohol intoxication. Pt has a long history of alcohol abuse, and had stopped for almost a year, but recently started again 2-3 months ago. Stopped drinking last time because he was falling. Currently he drinks 1 pint of vodka per day and thinks his last drink was 3 days ago. Denies any history of alcohol withdrawal, but daughter states he had some symptoms of withdrawal after stopping the last time.   In the ED, CT head was wnl. BG was 47, corrected to 60's with OJ. Pt unable to ambulate to bathroom with 2 assistants and walker.       Final discharge diagnoses and brief hospital course    1) Systolic CHF  - ECHO: EF 35-40% with moderate diffuse hypokinesis. Followed by Negative lexiscan Sestamibi study for inducible ischemia.  - f/u Cards in 1 month   - Continue  lisinopril 5mg  daily     2.) Falls/Alcohol Abuse  - 2/2 etOH intoxication, was drinking 1 pint vodka daily.  - Placed on CIWA protocol during admission, given Valium loading dose, did not go into DT's, no Ativan given in last 48 hours of admission  - Folate high and vitamin B12 wnl.  - Nutrition and protein diet important    3.) Hypoglycemia  - BG 47 on admission, corrected with OJs  - Stable in the low 100's throughout stay  - POC glucose and monitor symptoms    Labs/Imaging Needed on follow up:   POC Glucose  Follow up Cardiology 1 month    Follow-up Care:  Follow-up Information    Follow up With Details Comments Contact Info    Shelda Altes, MD On 10/13/2012 3pm [address]  [address]  [place] [address]  [phone]      Jennye Boroughs, MD             Physical examination at discharge  Visit Vitals   Item Reading   ??? BP 154/83   ??? Pulse 108   ??? Temp 97.6 ??F (36.4 ??C)   ???  Resp 16   ??? Ht 5\' 8"  (1.727 m)   ??? Wt 70.444 kg (155 lb 4.8 oz)   ??? BMI 23.62 kg/m2   ??? SpO2 95%       General:  Alert, cooperative, no distress   Head:  Normocephalic, without obvious abnormality, atraumatic   Eyes:  Conjunctivae/corneas clear. PERRL, EOMs intact   E/N/M/T: Nares normal. Septum midline. No nasal drainage or sinus tenderness  Lips, mucosa, and tongue normal   Clear oropharynx   Neck: Normal appearance and movements, symmetrical, trachea midline  No palpable adenopathy  No thyroid enlargement, tenderness or nodules  No carotid bruit   Normal JVP   Lungs:   Symmetrical chest expansion and respiratory effort  Clear to auscultation bilaterally   Chest wall:  No tenderness or deformity   Heart:  Regular rhythm   Sounds normal; no murmur, click, rub or gallop   Abdomen:   Soft, no tenderness  Bowel sounds normal  No masses or hepatosplenomegaly  No hernias present   Back: No CVA tenderness   Extremities: Stable ecchymosis to Upper ext B/L, focal erythema/eschar at R cubital fossa (healing appropriately  Extremities normal,  atraumatic  No cyanosis or edema  No DVT signs   Pulses 2+ and symmetric all extremities   Skin: No rashes or ulcers   Musculo-      skeletal: Gait not tested  Normal symmetry, ROM, strength and tone   Neuro: Normal cranial nerves  Normal reflexes and sensation   Psych: Alert, oriented x3  Normal affect, judgement and insight        Recent Labs      09/13/12   0320   WBC  6.5   HGB  13.0   HCT  38.3   PLT  179     Recent Labs      09/13/12   0320   NA  140   K  3.9   CL  105   CO2  26   BUN  21*   CREA  0.74   GLU  99   CA  8.6     No results found for this basename: SGOT, GPT, AP, TBIL, TP, ALB, GLOB, GGT, AML, AMYP, LPSE, HLPSE,  in the last 72 hours  No results found for this basename: INR, PTP, APTT,  in the last 72 hours   No results found for this basename: FE, TIBC, PSAT, FERR,  in the last 72 hours   No results found for this basename: PH, PCO2, PO2,  in the last 72 hours  No results found for this basename: CPK, CKMB, TROPONINI,  in the last 72 hours  No components found with this basename: GLPOC       Current Discharge Medication List      CONTINUE these medications which have NOT CHANGED    Details   thiamine (B-1) 100 mg tablet Take 100 mg by mouth daily.      cholecalciferol (VITAMIN D3) 1,000 unit tablet Take 1,000 Units by mouth daily.      ibuprofen (ADVIL) 200 mg tablet Take 600 mg by mouth two (2) times daily as needed for Pain.      aspirin 81 mg chewable tablet Take 1 Tab by mouth daily.  Qty: 90 Tab, Refills: 3    Associated Diagnoses: A-fib; Murmur, cardiac             Admission imaging studies:    Xr Spine Lumb 2 Or 3 V  09/10/2012  **Final Report**    ICD Codes / Adm.Diagnosis: 251.2  E888.9 / Hypoglycemia, unspecified   Unspecified fall Examination:  CR L SPINE 2 OR 3 VWS  - [id number] - Sep 10 2012  4:53PM Accession No:  [id number] Reason:  point tenderness over lumbar spine, recent fall and now unable to  walk   REPORT: INDICATION: Back pain, recent fall.  EXAM: Lumbar spine radiographs, 3  views.  COMPARISON:  None .  FINDINGS: A three-view examination of the lumbar spine reveals 2-3 mm  retrolisthesis at L4-5. Bone mineral content is normal for age .  There is  no obvious acute fracture or dislocation. Vertebral body height is mildly  diminished anteriorly at L1, but with associated endplate sclerosis and  spondylosis. Vertebral body heights      are otherwise preserved. Disc space  height is diminished at L4-5 and L5-S1. There is multilevel endplate  sclerosis and spondylosis. .  Pedicles and sacroiliac joints are intact, as  are visualized sacral foramina. Incidentally noted is heavy aortic  calcification with ectasia but no aneurysm.     IMPRESSION: No acute bony abnormality of the lumbar spine is confirmed. Mild  loss of height anteriorly at L1 is likely chronic. Correlate with clinical  symptoms. Grade 1 retrolisthesis with degenerative disc disease at L4-5.  Aortic ectasia.         Signing/Reading Doctor: Creed Copper [phone])   Approved: Creed Copper [phone])  Sep 10 2012  5:10PM                           Ct Head Wo Cont    09/10/2012  **Final Report**    ICD Codes / Adm.Diagnosis: 952841  12 / Fall  Back Pain Examination:  CT HEAD WO CON  - [id number] - Sep 10 2012 12:00PM Accession No:  [id number] Reason:  trauma   REPORT: EXAM:  CT HEAD WO CON  INDICATION:   trauma  COMPARISON: 10.25.2013.  TECHNIQUE: Unenhanced CT of the head was performed using 5 mm images. Brain  and bone windows were generated.  FINDINGS: The ventricles and sulci are normal in size, shape and configuration and  midline. There is stable mild periventricular white matter disease. There is  no intracranial hemorrhage, extra-axial collection, mass, mass effect or  midline shift.  The basilar cisterns are open. No acute infarct is  identified. The bone windows demonstrate no abnormalities. The visualized  portions of the paranasal sinuses and mastoid air cells are clear.     IMPRESSION: No acute process seen          Signing/Reading Doctor: Monico Blitz [phone])   Approved: Monico Blitz [phone]  Sep 10 2012 12:16PM                                 Xr Chest [facility]    09/12/2012  **Final Report**    ICD Codes / Adm.Diagnosis: 251.2  E888.9 / Hypoglycemia, unspecified   Unspecified fall Examination:  CR CHEST PORT  - [id number] - Sep 12 2012  4:22PM Accession No:  [id number] Reason:  SNF Requirement   REPORT: EXAM:  CR CHEST PORT  INDICATION:  SNF Requirement  COMPARISON:  November 23, 2011  FINDINGS: A portable AP radiograph of the chest was obtained at 1610 hours.  The patient is on a cardiac monitor.  There is minimal subsegmental  atelectasis at the  right base.  The cardiac and mediastinal contours and  pulmonary vascularity are normal.  There are degenerative changes of the  acromioclavicular joints.     IMPRESSION: No acute findings.         Signing/Reading Doctor: Raina Mina [phone])   ApprovedRaina Mina [phone])  Sep 12 2012  4:33PM                              Nm Cardiac Perlie Gold Mult    09/12/2012  **Final Report**    ICD Codes / Adm.Diagnosis: 251.2  E888.9 / Hypoglycemia, unspecified   Unspecified fall Examination:  NM CARD SPECT MULT WALL EF  - [id number] - Sep 12 2012 10:42AM Accession No:  [id number] Reason:  Cardiomyopathy   REPORT: One day lexiscan SPECT myocardial perfusion study   Pharmaceuticals: Lexiscan: 0.4 mg Resting dose: 10.7 mCi Tc 85m Sestamibi Stress dose: 33.1 mCi Tc 56m Sestamibi  Resting EKG:  Afib, RBBB, LAFB  Stress study: The stress was terminated secondary to lexiscan infusion  completion.  The patient denies chest discomfort with lexiscan infusion.  Stress EKG:  No significant ST-T wave changes.  PVCs noted.   Nuclear study: Rest and stress tomographic images demonstrate normal  myocardial perfusion both during rest and during stress.  There are no fixed  or reversible defects noted.  Gated images: Gated images demonstrate moderate inferior hypokinesis and  septal akinesis vs  paradoxical septal motion with an ejection fraction of  43%.     IMPRESSION: 1.  Subjectively and objectively negative lexiscan infusion. 2.  Normal myocardial perfusion. 3.  Moderate left ventricular systolic dysfunction (EF = 43%) with moderate  inferior hypokinesis and septal akinesis vs paradoxical septal motion. 4.  Negative lexiscan Sestamibi study for inducible ischemia.         Signing/Reading Doctor: Shelda Altes [phone])   ApprovedShelda Altes [phone])  Sep 12 2012 11:55AM                                   -------------------------------------------------------------------------------------------------------------------    Chronic Diagnoses:    Problem List as of 09/15/2012 Date Reviewed: 09/11/2012        ICD-9-CM Class Noted - Resolved    Cardiomyopathy 425.4  09/11/2012 - Present    Overview    Addendum 09/12/2012 12:11 PM by Shelda Altes, MD      A.  Echo (09/11/12):  EF 35-40% with mod GHK.  Mildly dil RV.  Mod dil RA/LA.  Mild MR.  Mod-severe TR.  Mod PR.  PASP 60.  B.  Lexiscan Cardiolite (09/12/12):  Normal perfusion, EF 43% with mod inf HK and septal AK vs PSM.          Syncope 780.2  09/10/2012 - Present        NASH (nonalcoholic steatohepatitis) 571.8  12/19/2011 - Present    Overview    Signed 12/19/2011  4:30 PM by Jennye Boroughs, MD      [place] 2013          Macrocytosis 289.89  12/11/2011 - Present    Overview    Signed 12/11/2011  2:23 PM by Jennye Boroughs, MD      B12 and folate normal  Uses ETOH          Elevated bilirubin 782.4  11/28/2011 - Present    Overview  Addendum 11/28/2011  2:54 PM by Jennye Boroughs, MD      He could have Gilbert's syndrome although he uses ETOH daily  Needs [place]          Unspecified vitamin D deficiency 268.9  11/28/2011 - Present    Overview    Signed 11/28/2011  2:54 PM by Jennye Boroughs, MD      Needs Rx          Falls [phone]  11/27/2011 - Present    Overview    Signed 11/27/2011  1:41 PM by Jennye Boroughs, MD      Head CT:  No Intracranial Disease Evident on Head  CT.            ETOH abuse 305.00  11/27/2011 - Present        Atrial fibrillation 427.31  11/27/2011 - Present              Time spent on discharge related activities today greater than 30 minutes.      Signed:      Gilles Chiquito   Family Medicine Resident      09/15/2012   8:49 AM     Dr. Honor Junes, MD   Family Medicine Attending      Cc: Jennye Boroughs, MD   I saw and evaluated the patient, performing the key elements of the service.  I discussed the findings, assessment and plan with the resident and agree with the resident's findings and plan as documented in the resident's note.

## 2012-09-15 NOTE — Progress Notes (Signed)
Bedside and Verbal shift change report given to  Julie, Rn (oncoming nurse) by  Josie, Rn (offgoing nurse). Report included the following information SBAR, Kardex, Intake/Output, MAR and Recent Results.

## 2012-09-15 NOTE — Nurse Consult (Addendum)
Patient admitted with syncope hx of heart failure/cardiomyopathy. EF of 35%-40%.  Currently on ACE.  PCP is Dr. Jean Rosenthal.  Case management at bedside to discuss discharge planning. Syncope and avoiding triggers with heart failure discharge planning noted in AVS. Would recommend all education be performed in presence of caretaker/family due to cognitive limitations.  NN notified of admission. Will continue to monitor patient status.

## 2012-09-15 NOTE — Progress Notes (Signed)
Bedside and Verbal shift change report given to Josie RN (oncoming nurse) by Marian RN (offgoing nurse). Report included the following information SBAR, Kardex, Intake/Output, MAR and Recent Results.

## 2012-09-15 NOTE — Progress Notes (Addendum)
8:34 am    Discharge orders for pt noted. Follow up message via [facility] sent to admissions at [facility] regarding status of insurance authorization for pt's SNF rehab placement. I will continue to follow up with admissions as needed.    11:45 am   Follow up phone call made to Darl Pikes at [facility] regarding insurance authorization for the pt. Per Darl Pikes, they have not received insurance authorization on the pt yet and she will contact me as soon as they get any information from the insurance provider.    4 pm    I have not received confirmation from [employer] regarding insurance authorization. Pt may have to wait until tomorrow for insurance authorization.    Elenora Fender RN

## 2012-09-15 NOTE — Progress Notes (Signed)
Attempted to visit with patient earlier today, but not a suitable time. Now providing pastoral support and prayer with patient and Kathie Rhodes, affirming the ongoing availability of chaplains for support as needed. He is hopeful for details to work together to be discharged from hospital.  Delaney Meigs, Chaplain, M.Div, [facility]  [phone]

## 2012-09-15 NOTE — Other (Signed)
Bedside shift change report given to Kerby Moors RN (oncoming nurse) by Margrett Rud (offgoing nurse). Report included the following information SBAR, Kardex, Intake/Output, MAR and Recent Results.

## 2012-09-15 NOTE — Progress Notes (Addendum)
[facility] with [facility] and [facility]  [address]  [place], [place] [address]   Office [phone], Fax [phone]      Family Medicine Daily Progress Note: 09/15/2012      Assessment/Plan:   Pt is a 79 y.o. male with a history of Afib, NASH and alcohol abuse who is admitted with falls, inability to walk and hypoglycemia.     Falls: Stable, Most likely 2/2 alochol intoxication. No further episodes since arrival. Folate high and vitamin B12 wnl. XR lumbar spine with no acute findings. No new changes on EKG.   - Will need SNF, pending insurance approval.   - Stable medically and awaiting prior authorization to [facility].     Systolic CHF, not in acute exacerbation: Stable, Echo shows EF 35-40% with moderate diffuse hypokinesis. No studies to compare, and pt doesn't think has had an echo in the past.   - Lexiscan: Normal myocardial perfusion, Moderate left ventricular systolic dysfunction (EF = 43%) with moderate inferior hypokinesis and septal akinesis vs paradoxical septal motion, Negative lexiscan Sestamibi study for inducible ischemia.   - Cards following, greatly appreciate recs: stable, f/u Cards in 1 month   - Telemetry: A-fib, PVC's  - Continue lisinopril 5mg  daily     Alcohol abuse: Stable, Previously drinking 1 pint vodka daily. Last drink 3 days PTA. Able to quit in past with help of pastor and PCP. Got valium loading dose of 20mg . No Ativan needed in the past 48hrs. CIWA 0.   - Continue CIWA protocol   - Goody bag daily   - Chaplain to follow while inpatient     Hypoglycemia: Resolved. Likely 2/2 poor nutritional intake while drinking   - Will d/c scheduled accuchecks and do only prn if pt symptomatic     NASH: Likely this is actually alcoholic liver disease. Currently asymptomatic   - No acute management necessary     FEN/Diet: Regular   Activity: Ambulate with assistance   DVT prophylaxis: Lovenox   Isolation precautions: None   Consultations:  RD, CM, PT/OT, Chaplain   Anticipated disposition: SNF Monday 8/18 or Tuesday 8/19  Code Status: FULL CODE        Subjective:   Pt seen this AM resting comfortably and denies any complaints.  Unsure of disposition and desires [facility] as a skilled nursing facility for further rehabilitation.    Review of Systems:   Review of Systems - History obtained from chart review and the patient  General [id number] for - chills, fatigue or night sweats  Hematological and Lymphatic ROS: positive for ecchymosis and excoriations  negative for - night sweats, pallor or swollen lymph nodes  Respiratory ROS: no cough, shortness of breath, or wheezing  Cardiovascular ROS: no chest pain or dyspnea on exertion  Gastrointestinal ROS: no abdominal pain, change in bowel habits, or black or bloody stools  Genito-Urinary ROS: no dysuria, trouble voiding, or hematuria    Objective:     Physical examination  Patient Vitals for the past 12 hrs:   BP Temp Pulse Resp SpO2 Weight   09/15/12 1012 144/88 mmHg 97.5 ??F (36.4 ??C) 103 17 97 % -   09/15/12 0621 154/83 mmHg 97.6 ??F (36.4 ??C) 108 16 95 % 70.444 kg (155 lb 4.8 oz)     Temp (24hrs), Avg:97.8 ??F (36.6 ??C), Min:97.5 ??F (36.4 ??C), Max:98.3 ??F (36.8 ??C)      Intake/Output Summary (Last 24 hours) at  09/15/12 1612  Last data filed at 09/15/12 1017   Gross per 24 hour   Intake      0 ml   Output    775 ml   Net   -775 ml          O2 Device: Room air     General:   Alert, cooperative, no acute distress   Head:   Atraumatic   Eyes:   Conjunctivae clear   ENT:  Oral mucosa normal   Neck:  Supple, trachea midline, no adenopathy   No JVD   Chest wall:    No tenderness or deformities    Lungs:   Clear to auscultation bilaterally    Heart:   Regular rhythm, no murmur,   Abdomen:    Soft, non-tender   No masses or organomegaly    Extremities: R arm ecchymosis and excoriations stable since falls, No edema or DVT signs, no palpable cords      Pulses:  Symmetric all extremities   Skin:  Warm and dry    No  rashes or lesions   Neurologic:  Oriented   No focal deficits   Psychiatric:  normal insight; pleasant affect, ready for rehabilitation         Data Review:       Recent Labs      09/13/12   0320   WBC  6.5   HGB  13.0   HCT  38.3   PLT  179     Recent Labs      09/13/12   0320   NA  140   K  3.9   CL  105   CO2  26   GLU  99   BUN  21*   CREA  0.74   CA  8.6     Medications reviewed  Current Facility-Administered Medications   Medication Dose Route Frequency   ??? lisinopril (PRINIVIL, ZESTRIL) tablet 5 mg  5 mg Oral DAILY   ??? sodium chloride (NS) flush 5-10 mL  5-10 mL IntraVENous Q8H   ??? sodium chloride (NS) flush 5-10 mL  5-10 mL IntraVENous PRN   ??? 0.9% sodium chloride 1,000 mL with mvi, adult no.4 with vit K 10 mL, thiamine 100 mg, folic acid 1 mg infusion   IntraVENous DAILY   ??? sodium chloride (NS) flush 5-10 mL  5-10 mL IntraVENous Q8H   ??? sodium chloride (NS) flush 5-10 mL  5-10 mL IntraVENous PRN   ??? ibuprofen (MOTRIN) tablet 400 mg  400 mg Oral Q4H PRN   ??? oxyCODONE-acetaminophen (PERCOCET) 5-325 mg per tablet 1 Tab  1 Tab Oral Q4H PRN   ??? enoxaparin (LOVENOX) injection 40 mg  40 mg SubCUTAneous Q24H   ??? LORazepam (ATIVAN) injection 2 mg  2 mg IntraVENous Q1H PRN   ??? LORazepam (ATIVAN) injection 4 mg  4 mg IntraVENous Q1H PRN   ??? glucose chewable tablet 16 g  4 Tab Oral PRN   ??? dextrose (D50W) injection syrg 12.5-25 g  12.5-25 g IntraVENous PRN   ??? glucagon (GLUCAGEN) injection 1 mg  1 mg IntraMUSCular PRN       Total time spent 30-74 minutes    Signed:   Gilles Chiquito   Resident, Family Medicine      Attending note:   To Follow    I reviewed the patient's medical history, the resident's findings on physical examination, the patient's diagnoses, and treatment plan as documented in the resident  note.  I concur with the treatment plan as documented.  Additional suggestions noted.

## 2012-09-16 MED ADMIN — lisinopril (PRINIVIL, ZESTRIL) tablet 5 mg: ORAL | @ 14:00:00 | NDC 00185540010

## 2012-09-16 MED ADMIN — sodium chloride (NS) flush 5-10 mL: INTRAVENOUS | @ 11:00:00 | NDC 87701099893

## 2012-09-16 MED ADMIN — sodium chloride (NS) flush 5-10 mL: INTRAVENOUS | @ 01:00:00 | NDC 87701099893

## 2012-09-16 NOTE — Progress Notes (Signed)
09/16/12  Cm received message from [facility] that they received insurance authorization and are able to accept pt for admission today.  Cm spoke with both pt and his daughter [facility] [phone]) who are agreeable to d/c to snf.  CM sent d/c paperwork via ecin.  Dtr wishes to transport and plan to pick pt up at 4pm today. Cm will continue to assist with d/c as needed. Hulen Skains, LCSW

## 2012-09-16 NOTE — Progress Notes (Signed)
Problem: Mobility Impaired (Adult and Pediatric)  Goal: *Acute Goals and Plan of Care (Insert Text)  Physical Therapy Goals  Initiated 09/11/2012  1. Patient will move from supine to sit and sit to supine , scoot up and down and roll side to side in bed with independence within 7 day(s).   2. Patient will transfer from bed to chair and chair to bed with independence using the least restrictive device within 7 day(s).  3. Patient will perform sit to stand with independence within 7 day(s).  4. Patient will ambulate with modified independence for 200 feet with the least restrictive device within 7 day(s).   5. Patient will ascend/descend 4 stairs with handrail(s) with modified independence within 7 day(s).   PHYSICAL THERAPY TREATMENT  Patient: Dylan Haney (79 y.o. male)  Date: 09/16/2012  Diagnosis: Syncope  Syncope <principal problem not specified>       Precautions: Fall;Seizure;Skin      ASSESSMENT:  Patient in bed.  Reports pain "everywhere" but especially his left forearm and elbow.  Patient states he is feeling better but still feels weak.  Improved transfers and gait, but patient still requires min assist with all transfers and gait training.  Patient needs cues for safer transfer technique - not to pull up on the walker and for hand placement when going to sit.  Patient reports he has a rollator at home that he used for two days "but it got away from me" and he fell.  Assist needed with rolling walker for steering and cues for safety.     Progression toward goals:  [X]     Improving appropriately and progressing toward goals  [ ]     Improving slowly and progressing toward goals  [ ]     Not making progress toward goals and plan of care will be adjusted       PLAN:  Patient continues to benefit from skilled intervention to address the above impairments.  Continue treatment per established plan of care.  Discharge Recommendations:  Skilled Nursing Facility  Further Equipment Recommendations for Discharge:  tbd        SUBJECTIVE:   Patient stated ???It hurts everywhere.???      OBJECTIVE DATA SUMMARY:   Critical Behavior:  Neurologic State: Alert  Orientation Level: Oriented X4  Cognition: Follows commands  Safety/Judgement: Awareness of environment;Insight into deficits  Functional Mobility Training:  Bed Mobility:     Supine to Sit: Minimal assistance;Assist X1;Additional time (needed a hand and bed rail to pull up on)              Transfers:  Sit to Stand: Minimum assistance;Assist X2;Verbal cues;Safety concerns  Stand to Sit: Minimum assistance;Assist X2;Verbal cues;Safety concerns                             Balance:  Sitting: Intact  Ambulation/Gait Training:  Distance (ft): 55 Feet (ft)  Assistive Device: Walker, rolling;Gait belt  Ambulation - Level of Assistance: Minimal assistance (of two)                 Base of Support: Widened     Speed/Cadence: Slow;Fluctuations                                Assist needed with steering and cues for safety  Stairs:  Pain:  Pain Scale 1: Visual  Pain Intensity 1: 0              Activity Tolerance:   Increased respiratory rate with gait training  Please refer to the flowsheet for vital signs taken during this treatment.  After treatment:   [X]     Patient left in no apparent distress sitting up in chair  [ ]     Patient left in no apparent distress in bed  [X]     Call bell left within reach  [X]     Nursing notified  [X]     OT asssistant present  [ ]     Bed alarm activated      COMMUNICATION/COLLABORATION:   The patient???s plan of care was discussed with: Certified Occupational Therapy Assistant, Registered Nurse and Rehabilitation Attendant    Brita Romp, PT   Time Calculation: 23 mins

## 2012-09-16 NOTE — Discharge Summary (Addendum)
Cardiology follow-up:   Follow up with Shelda Altes, MD on September 15th, 2014 at 3pm.   [address]   [address], [place] [address]   [phone]   THIS PATIENT IS BEING ADMITTED UNDER   [facility]     [facility]   [address]   [place], [place] [address]   Office [phone], Fax [phone]   Patient [place] [facility] [facility] H&P     FARHAD BURLESON / [id number]  DOB:  12/05/1924     Admitted  09/10/2012  Discharged:  To Be Determined    Primary care provider: Jennye Boroughs, MD   Admitting provider:  Gilles Chiquito, DO - Family Medicine Resident   Dr. Honor Junes, MD - [facility] Medicine Attending    . . . . . . . . . . . . . . . . . . . . . . . . . . . . . . . . . . . . . . . . . . . . . . . . . . . . . . . . . . . . . . . . . . . . . . . Marland Kitchen   HPI:   Dylan Haney is a 79 y.o. male with a history of Afib, NASH and alcohol abuse who is admitted with falls, inability to walk. Pt reports several falls over the past few weeks, happen while walking, pt unsure of circumstances leading up to falls. Per daughter, pt is passing out and falling 2/2 alcohol intoxication. Pt has a long history of alcohol abuse, and had stopped for almost a year, but recently started again 2-3 months ago. Stopped drinking last time because he was falling. Currently he drinks 1 pint of vodka per day and thinks his last drink was 3 days ago. Denies any history of alcohol withdrawal, but daughter states he had some symptoms of withdrawal after stopping the last time.   In the ED, CT head was wnl. BG was 47, corrected to 60's with OJ. Pt unable to ambulate to bathroom with 2 assistants and walker.       No Known Allergies   Past Medical History    Diagnosis  Date    ???  Alcohol abuse     ???  Ill-defined condition       seasonal allergies., fatty liver, alcoholism    ???  Ill-defined condition       a-fib    ???   Cardiomyopathy  09/11/2012      A. Echo (09/11/12): EF 35-40% with mod GHK. Mildly dil RV. Mod dil RA/LA. Mild MR. Mod-severe TR. Mod PR. PASP 60.      History reviewed. No pertinent past surgical history.   History reviewed. No pertinent family history.   History    Substance Use Topics    ???  Smoking status:  Former Smoker    ???  Smokeless tobacco:  Never Used    ???  Alcohol Use:  0.0 oz/week       Comment: about pint of vodka per day grandson      Physical Examination   Patient Vitals for the past 24 hrs:   BP  Temp  Pulse  Resp  SpO2  Weight    09/16/12 1024  157/80 mmHg  98.3 ??F (36.8 ??C)  91  18  95 %  -    09/16/12 0600  147/82 mmHg  98.2 ??F (36.8 ??C)  107  18  96 %  -    09/16/12 0453  -  -  -  -  -  70.081 kg (154 lb 8 oz)    09/16/12 0214  150/81 mmHg  98.1 ??F (36.7 ??C)  85  18  97 %  -    09/16/12 0030  145/78 mmHg  98.2 ??F (36.8 ??C)  88  18  98 %  -    09/15/12 2118  155/74 mmHg  98 ??F (36.7 ??C)  93  18  98 %  -    09/15/12 1903  131/61 mmHg  97.7 ??F (36.5 ??C)  83  18  97 %  -      Temp (24hrs), Avg:98.1 ??F (36.7 ??C), Min:97.7 ??F (36.5 ??C), Max:98.3 ??F (36.8 ??C)       Intake/Output Summary (Last 24 hours) at 09/16/12 1624  Last data filed at 09/16/12 0754    Gross per 24 hour    Intake  0 ml    Output  1000 ml    Net  -1000 ml        O2 Device: Room air   BP 157/80   Pulse 91   Temp(Src) 98.3 ??F (36.8 ??C)   Resp 18   Ht 5\' 8"  (1.727 m)   Wt 70.081 kg (154 lb 8 oz)   BMI 23.5 kg/m2   SpO2 95%   General:  Alert, cooperative, no distress, appears stated age.    Head:  Normocephalic, without obvious abnormality, atraumatic.    Eyes:  Conjunctivae/corneas clear. PERRL, EOMs intact. Fundi benign    Ears:  Normal TMs and external ear canals both ears.    Nose:  Nares normal. Septum midline. Mucosa normal. No drainage or sinus tenderness.    Throat:  Lips, mucosa, and tongue normal. Teeth and gums normal.    Neck:  Supple, symmetrical, trachea midline, no adenopathy, thyroid: no enlargement/tenderness/nodules, no  carotid bruit and no JVD.    Back:  Symmetric, no curvature. ROM normal. No CVA tenderness.    Lungs:  Clear to auscultation bilaterally.    Chest wall:  No tenderness or deformity.    Heart:  Regular rate and rhythm, S1, S2 normal, no murmur, click, rub or gallop.    Abdomen:  Soft, non-tender. Bowel sounds normal. No masses, No organomegaly.    Genitalia:  Normal male without lesion, discharge or tenderness.    Rectal:  Normal tone, normal prostate, no masses or tenderness   Guaiac negative stool.    Extremities:  Extremities normal, atraumatic, no cyanosis or edema.   Stable ecchymosis to Upper ext B/L, focal erythema/eschar at R cubital fossa (healing appropriately)    Pulses:  2+ and symmetric all extremities.    Skin:  Skin color, texture, turgor normal. No rashes or lesions    Lymph nodes:  Cervical, supraclavicular, and axillary nodes normal.    Neurologic:  CNII-XII intact. Normal strength, sensation and reflexes throughout.    MMSE:   25/30   Deductions for date, "DL*OW", 2/3 recall, incorrect "no ifs ands or buts", copying shapes.   Otherwise normal exam.   ADL/IDL:   Balance:   Sitting: Intact   Standing: Intact;With support   ADL Intervention:   Lower Body Dressing Assistance   Dressing Assistance: Minimum assistance   Underpants: Minimum assistance   Pants With Button/Zipper: Minimum assistance  Critical Behavior:   Neurologic State: Alert   Orientation Level: Oriented X4   Cognition: Follows commands   Safety/Judgement: Awareness of environment;Insight into deficits   Functional Mobility Training:   Bed Mobility:   Supine to Sit: Minimal assistance;Assist X1;Additional time (needed a hand and bed rail to pull up on)   Transfers:   Sit to Stand: Minimum assistance;Assist X2;Verbal cues;Safety concerns   Stand to Sit: Minimum assistance;Assist X2;Verbal cues;Safety concerns   Balance:   Sitting: Intact   Ambulation/Gait Training:   Distance (ft): 55 Feet (ft)   Assistive Device: Walker, rolling;Gait  belt   Ambulation - Level of Assistance: Minimal assistance (of two)   Base of Support: Widened   Speed/Cadence: Slow;Fluctuations   Assist needed with steering and cues for safety   Stairs:   Data Review   LABS:   No results found for this basename: WBC, HGB, HCT, PLT, in the last 72 hours   No results found for this basename: NA, K, CL, CO2, GLU, BUN, CREA, CA, MG, PHOS, ALB, TBIL, SGOT, ALT, in the last 72 hours   No results found for this basename: INR, in the last 72 hours   Cultures:   No results found for this basename: SDES      No results found for this basename: CULT      Admission Imaging:   Xr Spine Lumb 2 Or 3 V   Oct 08, 2012 **Final Report** ICD Codes / Adm.Diagnosis: 251.2 E888.9 / Hypoglycemia, unspecified Unspecified fall Examination: CR L SPINE 2 OR 3 VWS - [id number] - 10/08/12 4:53PM Accession No: [id number] Reason: point tenderness over lumbar spine, recent fall and now unable to walk REPORT: INDICATION: Back pain, recent fall. EXAM: Lumbar spine radiographs, 3 views. COMPARISON: None . FINDINGS: A three-view examination of the lumbar spine reveals 2-3 mm retrolisthesis at L4-5. Bone mineral content is normal for age . There is no obvious acute fracture or dislocation. Vertebral body height is mildly diminished anteriorly at L1, but with associated endplate sclerosis and spondylosis. Vertebral body heights are otherwise preserved. Disc space height is diminished at L4-5 and L5-S1. There is multilevel endplate sclerosis and spondylosis. . Pedicles and sacroiliac joints are intact, as are visualized sacral foramina. Incidentally noted is heavy aortic calcification with ectasia but no aneurysm. IMPRESSION: No acute bony abnormality of the lumbar spine is confirmed. Mild loss of height anteriorly at L1 is likely chronic. Correlate with clinical symptoms. Grade 1 retrolisthesis with degenerative disc disease at L4-5. Aortic ectasia. Signing/Reading Doctor: Creed Copper [phone]) Approved: Creed Copper [phone]) 10-08-12 5:10PM   Ct Head Wo Cont   2012/10/08 **Final Report** ICD Codes / Adm.Diagnosis: 086578 12 / Fall Back Pain Examination: CT HEAD WO CON - [id number] - Oct 08, 2012 12:00PM Accession No: [id number] Reason: trauma REPORT: EXAM: CT HEAD WO CON INDICATION: trauma COMPARISON: 10.25.2013. TECHNIQUE: Unenhanced CT of the head was performed using 5 mm images. Brain and bone windows were generated. FINDINGS: The ventricles and sulci are normal in size, shape and configuration and midline. There is stable mild periventricular white matter disease. There is no intracranial hemorrhage, extra-axial collection, mass, mass effect or midline shift. The basilar cisterns are open. No acute infarct is identified. The bone windows demonstrate no abnormalities. The visualized portions of the paranasal sinuses and mastoid air cells are clear. IMPRESSION: No acute process seen Signing/Reading Doctor: Monico Blitz [phone]) Approved: Monico Blitz [phone]) 2012/10/08 12:16PM   Xr Chest [place]  2012/09/30 **Final Report** ICD Codes / Adm.Diagnosis: 251.2 E888.9 / Hypoglycemia, unspecified Unspecified fall Examination: CR CHEST PORT - [id number] - 30-Sep-2012 4:22PM Accession No: [id number] Reason: SNF Requirement REPORT: EXAM: CR CHEST PORT INDICATION: SNF Requirement COMPARISON: November 23, 2011 FINDINGS: A portable AP radiograph of the chest was obtained at 1610 hours. The patient is on a cardiac monitor. There is minimal subsegmental atelectasis at the right base. The cardiac and mediastinal contours and pulmonary vascularity are normal. There are degenerative changes of the acromioclavicular joints. IMPRESSION: No acute findings. Signing/Reading Doctor: Raina Mina [phone]) ApprovedRaina Mina [phone]) 09/30/12 4:33PM   Nm Cardiac Perlie Gold Mult   September 30, 2012 **Final Report** ICD Codes / Adm.Diagnosis: 251.2 E888.9 / Hypoglycemia, unspecified Unspecified fall Examination: NM CARD SPECT MULT WALL  EF - [id number] - 09/30/2012 10:42AM Accession No: [id number] Reason: Cardiomyopathy REPORT: One day lexiscan SPECT myocardial perfusion study [employer]: Lexiscan: 0.4 mg Resting dose: 10.7 mCi Tc 24m Sestamibi Stress dose: 33.1 mCi Tc 41m Sestamibi Resting EKG: Afib, RBBB, LAFB Stress study: The stress was terminated secondary to lexiscan infusion completion. The patient denies chest discomfort with lexiscan infusion. Stress EKG: No significant ST-T wave changes. PVCs noted. Nuclear study: Rest and stress tomographic images demonstrate normal myocardial perfusion both during rest and during stress. There are no fixed or reversible defects noted. Gated images: Gated images demonstrate moderate inferior hypokinesis and septal akinesis vs paradoxical septal motion with an ejection fraction of 43%. IMPRESSION: 1. Subjectively and objectively negative lexiscan infusion. 2. Normal myocardial perfusion. 3. Moderate left ventricular systolic dysfunction (EF = 43%) with moderate inferior hypokinesis and septal akinesis vs paradoxical septal motion. 4. Negative lexiscan Sestamibi study for inducible ischemia. Signing/Reading Doctor: Shelda Altes [phone]) Approved: Shelda Altes [phone]) 09/30/2012 11:55AM   FINAL DIAGNOSES & HOSPITAL COURSE:   1) Systolic CHF   - ECHO: EF 35-40% with moderate diffuse hypokinesis. Followed by Negative lexiscan Sestamibi study for inducible ischemia.   - f/u Cards in 1 month   - Continue lisinopril 5mg  daily   2.) Falls/Alcohol Abuse   - 2/2 etOH intoxication, was drinking 1 pint vodka daily.   - Placed on CIWA protocol during admission, given Valium loading dose, did not go into DT's, no Ativan given in last 48 hours of admission   - Folate high and vitamin B12 wnl.   - Nutrition and protein diet important   3.) Hypoglycemia   - BG 47 on admission, corrected with OJs   - Stable in the low 100's throughout stay   - POC glucose and monitor symptoms   Labs/Imaging Needed on follow up:   POC  Glucose   Follow up Cardiology 1 month   FOLLOW-UP CARE RECOMMENDATIONS:   Appointments   Follow-up Information     Follow up With  Details  Comments  Contact Info     Shelda Altes, MD  On 10/17/2012  3pm  [address]   [address]   [place] [address]   [phone]     Jennye Boroughs, MD    [address]   [facility]   [place] [address]   [phone]     [facility] [phone])             It is very important that you keep follow-up appointment(s).   Bring discharge papers, medication list (and/or medication bottles) to follow-up appointments for review by outpatient provider(s).  FOLLOW-UP TESTS RECOMMENDED:   None  ONGOING TREATMENT PLAN:   Lisinopril 5 mg qD   PENDING TEST RESULTS:   At the time of discharge the following test results are still pending: None.   Please review these results as they become available.   Specific symptoms to watch for: chest pain, shortness of breath, fever, chills, nausea, vomiting, diarrhea, change in mentation, falling, weakness, bleeding.   DIET: Cardiac Diet   ACTIVITY: Activity as tolerated   WOUND CARE: None   EQUIPMENT needed: None   INCIDENTAL FINDINGS: None   GOALS OF CARE:   x  Eventual return to home/independent/assisted living     Long term SNF     Hospice     No rehospitalization    Patient condition:   Functional status    Poor    x  Deconditioned     Independent    Cognition   x  Lucid     Forgetful (some sensescence)     Dementia    Catheters/lines (plus indication)    Foley     PICC     PEG    x     Code status   x  Full code     DNR    . . . . . . . . . . . . . . . . . . . . . . . . . . . . . . . . . . . . . . . . . . . . . . . . . . . . . . . . . . . . . . . . . . . . . . . Marland Kitchen   CHRONIC MEDICAL CONDITIONS:   Problem List as of 09/16/2012  Date Reviewed: 09/11/2012       ICD-9-CM  Class  Noted - Resolved     Cardiomyopathy  425.4   09/11/2012 - Present     Overview     Addendum 09/12/2012 12:11 PM by Shelda Altes, MD       A. Echo (09/11/12): EF 35-40% with mod GHK. Mildly dil RV. Mod dil RA/LA. Mild MR. Mod-severe TR. Mod PR. PASP 60.   B. Lexiscan Cardiolite (09/12/12): Normal perfusion, EF 43% with mod inf HK and septal AK vs PSM.         Syncope  780.2   09/10/2012 - Present         NASH (nonalcoholic steatohepatitis)  571.8   12/19/2011 - Present     Overview     Signed 12/19/2011 4:30 PM by Jennye Boroughs, MD      [place] 2013         Macrocytosis  289.89   12/11/2011 - Present     Overview     Signed 12/11/2011 2:23 PM by Jennye Boroughs, MD      B12 and folate normal   Uses ETOH         Elevated bilirubin  782.4   11/28/2011 - Present     Overview     Addendum 11/28/2011 2:54 PM by Jennye Boroughs, MD      He could have Gilbert's syndrome although he uses ETOH daily   Needs [place]         Unspecified vitamin D deficiency  268.9   11/28/2011 - Present     Overview     Signed 11/28/2011 2:54 PM by Jennye Boroughs, MD      Needs Rx  Falls  E888.9   11/27/2011 - Present     Overview     Signed 11/27/2011 1:41 PM by Jennye Boroughs, MD      Head CT: No Intracranial Disease Evident on Head CT.         ETOH abuse  305.00   11/27/2011 - Present         Atrial fibrillation  427.31   11/27/2011 - Present             Admission H&P and Discharge Medications Reviewed by:   _____________________________________________   Gilles Chiquito     I reviewed the patient's medical history, the resident's findings on physical examination, the patient's diagnoses, and treatment plan as documented in the resident note.  I concur with the treatment plan as documented.  Additional suggestions noted.

## 2012-09-16 NOTE — Progress Notes (Signed)
Shift report  Pt had uneventful night still afib on tele no complaints of pain .  Bedside shift change report given to Armando Reichert RN  (oncoming nurse) by me (offgoing nurse).  Report given with SBAR , Mar , I & O  Reports .

## 2012-09-16 NOTE — Progress Notes (Signed)
Bedside and Verbal shift change report given to Armando Reichert RN (oncoming nurse) by Eldridge Scot (offgoing nurse). Report included the following information SBAR, Kardex, Intake/Output, MAR and Recent Results.

## 2012-09-16 NOTE — Progress Notes (Signed)
Problem: Self Care Deficits Care Plan (Adult)  Goal: *Acute Goals and Plan of Care (Insert Text)  Occupational Therapy Goals  Initiated 09/11/2012  1. Patient will perform lower body dressing with minimal assistance/contact guard assist within 7 day(s).  2. Patient will perform toilet transfers with minimal assistance/contact guard assist within 7 day(s).  3. Patient will perform all aspects of toileting with minimal assistance/contact guard assist within 7 day(s).  4. Patient will participate in upper extremity therapeutic exercise/activities with independence for at least 8 minutes within 7 day(s).   5. Patient will utilize energy conservation techniques during functional activities with verbal cues within 7 day(s).      OCCUPATIONAL THERAPY TREATMENT  Patient: Dylan Haney (79 y.o. male)  Date: 09/16/2012  Diagnosis: Syncope  Syncope <principal problem not specified>       Precautions: Fall;Seizure;Skin  Chart, occupational therapy assessment, plan of care, and goals were reviewed.      ASSESSMENT:  Pt donned underwear and pants with min assist to pull pants over hips and with fasteners. He needed min assist for sit to stand and verbal cues for safety ie to have walker in front of him for support vs holding to rolling table. Pt able to participate with simple UE exercises but limited as to shoulder flexion due to arthritis per patient. Mr Sharman is scheduled to be discharged to a skilled nursing facility later today.  Progression toward goals:  [ ]           Improving appropriately and progressing toward goals  [X]           Improving slowly and progressing toward goals  [ ]           Not making progress toward goals and plan of care will be adjusted       PLAN:  Patient continues to benefit from skilled intervention to address the above impairments.  Continue treatment per established plan of care.  Discharge Recommendations:  Skilled nursing facility  Further Equipment Recommendations for Discharge:  None        SUBJECTIVE:   Patient stated ???I get a little nervous about moving later today.???      OBJECTIVE DATA SUMMARY:   Cognitive/Behavioral Status:  Neurologic State: Alert;Appropriate for age  Orientation Level: Oriented X4  Cognition: Follows commands           Functional Mobility and Transfers for ADLs:              Bed Mobility:     Supine to Sit: Minimal assistance;Assist X1;Additional time (needed a hand and bed rail to pull up on)                    Transfers:  Sit to Stand: Assist X2;Minimum assistance;Safety concerns                                                                                                     Balance:  Sitting: Intact  Standing: Intact;With support  ADL Intervention:  Lower Body Dressing Assistance  Dressing Assistance: Minimum assistance  Underpants: Minimum assistance  Pants With Button/Zipper: Minimum assistance     Therapeutic Exercises:     EXERCISE   Sets   Reps   Active Active Assist   Passive   Comments   Finger flex/ext 1 10 [X]            [ ]            [ ]                Wrist flex/ext 1 10 [X]            [ ]            [ ]                Elbow flex/ext 1 10 [X]            [ ]            [ ]                Shoulder flex/ext 1 5 [X]            [ ]            [ ]                      [ ]            [ ]            [ ]                      [ ]            [ ]            [ ]                      [ ]            [ ]            [ ]                      [ ]            [ ]            [ ]                      [ ]            [ ]            [ ]                      [ ]            [ ]            [ ]                      [ ]            [ ]            [ ]                      Pain:  Pain Scale 1: Visual  Pain Intensity 1: 0                Activity Tolerance:    Fair  Please refer to the flowsheet for vital signs taken during this treatment.  After treatment:   [X]   Patient left in no apparent distress sitting up in chair  [ ]   Patient left in no apparent distress in bed  [X]   Call bell left within reach   [X]   Nursing notified  [ ]   Caregiver present  [X]   Chair alarm activated      COMMUNICATION/COLLABORATION:   The patient???s plan of care was discussed with: Physical Therapist, Occupational Therapist and Registered Nurse    Prudencio Pair. Hite, COTA/L  Time Calculation: 28 mins

## 2012-09-16 NOTE — Progress Notes (Signed)
I have reviewed discharge instructions with the patient and caregiver.  The patient and caregiver verbalized understanding. Pt and caregiver given the opportunity to ask questions. IV catheter removed, tip intact. Report called to [facility] report received by Heath Gold, LPN. Report given in SBAR format, Kardex, recent result and vital signs, I&O, medications, and discharge instructions. Daughter transporting pt to nursing facility.

## 2012-09-16 NOTE — Telephone Encounter (Signed)
Message copied by Mardi Mainland on Tue Sep 16, 2012  7:16 PM  ------       Message from: Clovia Cuff D       Created: Tue Sep 16, 2012  6:54 PM       Regarding: Dr. Richrd Humbles         Ms. Dean from [facility] [phone] is requesting today a call from the nurse or doctor regarding needing to go over orders for medications if pt can take the Rx today due to just being released from the [facility] today.   ------

## 2012-09-16 NOTE — Progress Notes (Signed)
09/16/12  Cm sent OT notes to [name] and left message for admissions re status of insurance authorization, as this is primary obstacle to discharge at this point.  CM will follow up and assist with d/c once information re insurance authorization is obtained.  Hulen Skains, LCSW

## 2012-09-16 NOTE — Progress Notes (Signed)
9 AM       Received phone call from Darl Pikes with admissions at [facility] requesting for current therapy notes for pt. Referral via [facility] updated with current clinicals. Discussed with Dr. Cathie Hoops and alerted therapy regarding need for updated PT/OT notes on pt.    Elenora Fender RN

## 2012-09-17 NOTE — Progress Notes (Signed)
Pt. listed on [facility] Report dated 09/16/12 went to [facility] for Hypoglycemia. Patient is currently at [facility] facility. Called to ask family to notify office of patient discharge of [name].

## 2012-09-19 NOTE — Telephone Encounter (Signed)
Spoke with Dr. Luciana Axe who is on Nursing Home this month. This has been taken care of.

## 2012-10-13 NOTE — Progress Notes (Signed)
Subjective:     Problem List Date Reviewed: 10/13/2012        ICD-9-CM Class Noted    Cardiomyopathy 425.4  09/11/2012    Overview    Addendum 09/12/2012 12:11 PM by Shelda Altes, MD      A.  Echo (09/11/12):  EF 35-40% with mod GHK.  Mildly dil RV.  Mod dil RA/LA.  Mild MR.  Mod-severe TR.  Mod PR.  PASP 60.  B.  Lexiscan Cardiolite (09/12/12):  Normal perfusion, EF 43% with mod inf HK and septal AK vs PSM.          NASH (nonalcoholic steatohepatitis) 571.8  12/19/2011    Overview    Signed 12/19/2011  4:30 PM by Jennye Boroughs, MD      [place] 2013          Macrocytosis 289.89  12/11/2011    Overview    Signed 12/11/2011  2:23 PM by Jennye Boroughs, MD      B12 and folate normal  Uses ETOH          Elevated bilirubin 782.4  11/28/2011    Overview    Addendum 11/28/2011  2:54 PM by Jennye Boroughs, MD      He could have Gilbert's syndrome although he uses ETOH daily  Needs [place]          Unspecified vitamin D deficiency 268.9  11/28/2011    Overview    Signed 11/28/2011  2:54 PM by Jennye Boroughs, MD      Needs Rx          Falls [phone]  11/27/2011    Overview    Signed 11/27/2011  1:41 PM by Jennye Boroughs, MD      Head CT:  No Intracranial Disease Evident on Head CT.            ETOH abuse 305.00  11/27/2011        Atrial fibrillation 427.31  11/27/2011              Dylan Haney is an 79 y.o. man with the above past medical history, who presents for follow up.  He is doing reasonably well from a cardiac standpoint.  He does feel fatigued, however, his blood pressure is on the lowered side.  He denies any chest pain, chest discomfort, shortness of breath, orthopnea, paroxysmal nocturnal dyspnea, lower extremity swelling, palpitations, syncope or near syncope.           History   Smoking status   ??? Former Smoker   Smokeless tobacco   ??? Never Used       Current Outpatient Prescriptions   Medication Sig Dispense Refill   ??? folic acid 400 mcg tablet Take 400 mcg by mouth daily.       ??? trimethoprim-sulfamethoxazole (BACTRIM  DS) 160-800 mg per tablet Take 1 tablet by mouth two (2) times a day.       ??? lisinopril (PRINIVIL, ZESTRIL) 5 mg tablet Take 1 Tab by mouth daily.  30 Tab  1   ??? thiamine (B-1) 100 mg tablet Take 100 mg by mouth daily.       ??? cholecalciferol (VITAMIN D3) 1,000 unit tablet Take 1,000 Units by mouth daily.       ??? aspirin 81 mg chewable tablet Take 1 Tab by mouth daily.  90 Tab  3   ??? ibuprofen (ADVIL) 200 mg tablet Take 600 mg by mouth two (2) times daily as needed  for Pain.           Objective:   BP 96/76   Pulse 87   Ht 5\' 8"  (1.727 m)   Wt 145 lb (65.772 kg)   BMI 22.05 kg/m2    HEENT Exam:     Normocephalic, atraumatic. EOMI. Oropharynx negative. Neck supple. No lymphadenopathy.          Lung Exam:     The patient is not dyspneic. There is no cough. The lungs are clear to percussion. Breath sounds are heard equally in all lung fields. There are no wheezes, rales, rhonchi, or rubs heard on auscultation.          Heart Exam:     The rhythm is irregularly irregular. The PMI is in the 5th intercostal space of the MCL. Apical impulse is normal. S1 is regular. S2 is physiologic. There is no S3, S4 gallop, murmur, click, or rub.          Abdomen Exam:     Bowel sounds are normoactive. Abdomen benign.         Extremities Exam:     The extremities are atraumatic appearing. There is no clubbing, cyanosis, edema, ulcers, varicose veins, rash, swelling, erythemia noted in the extremities. The neurovascular status is grossly intact with normal distal sensation and pulses.          Vascular Exam:     The radial, brachial, dorsalis pedis, posterior tibial, are equal and strong bilaterally The carotids are equal bilaterally without bruits.              EKG: AF @ 89, RBBB, LAFB.     Assessment/Plan:     Dylan Haney overall appears stable from a cardiac standpoint.  We are going to hold his Lisinopril temporarily to see if that helps with his fatigue, as his blood pressure is on the low side.  If this does not help at all, he  will resume his Lisinopril.  If it does help with regard to his energy levels he is going to discontinue it.  I would like to see him again in three months time for follow up.  We discussed alcohol cessation, which he is doing well with given that he is at a nursing home.     Plan:  1. Continue outpatient medication regimen.  2. Hold Lisinopril as described above.   3. Follow up with me in three months time.  4. Avoidance of alcohol.  5. Call my office, call his primary care physician, or return to the hospital should any concerning symptomatology arise.      Dylan Haney indicated that he understood this plan and wished to proceed ahead.            Patient Care Team:  Jennye Boroughs, MD as PCP - [facility])

## 2012-10-13 NOTE — Patient Instructions (Signed)
MyChart Activation    Thank you for requesting access to MyChart. Please follow the instructions below to securely access and download your online medical record. MyChart allows you to send messages to your doctor, view your test results, renew your prescriptions, schedule appointments, and more.    How Do I Sign Up?    1. In your internet browser, go to [email or web address]  2. Click on the First Time User? Click Here link in the Sign In box. You will be redirect to the New Member Sign Up page.  3. Enter your MyChart Access Code exactly as it appears below. You will not need to use this code after you???ve completed the sign-up process. If you do not sign up before the expiration date, you must request a new code.    MyChart Access Code: Rowe Clack  Expires: 12/14/2012 12:22 PM (This is the date your MyChart access code will expire)    4. Enter the last four digits of your Social Security Number (xxxx) and Date of Birth (mm/dd/yyyy) as indicated and click Submit. You will be taken to the next sign-up page.  5. Create a MyChart ID. This will be your MyChart login ID and cannot be changed, so think of one that is secure and easy to remember.  6. Create a MyChart password. You can change your password at any time.  7. Enter your Password Reset Question and Answer. This can be used at a later time if you forget your password.   8. Enter your e-mail address. You will receive e-mail notification when new information is available in MyChart.  9. Click Sign Up. You can now view and download portions of your medical record.  10. Click the Download Summary menu link to download a portable copy of your medical information.    Additional Information    If you have questions, please visit the Frequently Asked Questions section of the MyChart website at [email or web address] Remember, MyChart is NOT to be used for urgent needs. For medical emergencies, dial 911.        Atrial Fibrillation: After Your  Visit  Your Care Instructions     Atrial fibrillation is an irregular and often fast heartbeat. Treating this condition is important for several reasons. It can cause blood clots, which can travel from your heart to your brain and cause a stroke. If you have a fast heartbeat, you may feel lightheaded, dizzy, and weak. An irregular heartbeat can also increase your risk for heart failure.  Atrial fibrillation is often the result of another heart condition, such as high blood pressure or coronary artery disease. Making changes to improve your heart condition will help you stay healthy and active.  Follow-up care is a key part of your treatment and safety. Be sure to make and go to all appointments, and call your doctor if you are having problems. It's also a good idea to know your test results and keep a list of the medicines you take.  How can you care for yourself at home?  Medicines  ?? Take your medicines exactly as prescribed. Call your doctor if you think you are having a problem with your medicine. You will get more details on the specific medicines your doctor prescribes.  ?? If your doctor has given you a blood thinner to prevent a stroke, be sure you get instructions about how to take your medicine safely. Blood thinners can cause serious bleeding problems.  ?? Do not take any vitamins, over-the-counter drugs, or herbal products  without talking to your doctor first.  Lifestyle changes  ?? Do not smoke. Smoking can increase your chance of a stroke and heart attack. If you need help quitting, talk to your doctor about stop-smoking programs and medicines. These can increase your chances of quitting for good.  ?? Eat a balanced diet that is low in fat and cholesterol.  ?? Limit alcohol to 2 drinks a day for men and 1 drink a day for women. Too much alcohol can cause health problems.  ?? Avoid colds and flu. Get a pneumococcal vaccine shot. If you have had one before, ask your doctor whether you need another dose. Get a  flu shot every year. If you must be around people with colds or flu, wash your hands often.  Activity  ?? If your doctor recommends it, get more exercise. Walking is a good choice. Bit by bit, increase the amount you walk every day. Try for at least 30 minutes on most days of the week. You also may want to swim, bike, or do other activities. Your doctor may suggest that you join a cardiac rehabilitation program so that you can have help increasing your physical activity safely.  ?? Start light exercise if your doctor says it is okay. Even a small amount will help you get stronger, have more energy, and manage stress. Walking is an easy way to get exercise. Start out by walking a little more than you did in the hospital. Gradually increase the amount you walk.  ?? When you exercise, watch for signs that your heart is working too hard. You are pushing too hard if you cannot talk while you are exercising. If you become short of breath or dizzy or have chest pain, sit down and rest immediately.  ?? Check your pulse regularly. Place two fingers on the artery at the palm side of your wrist, in line with your thumb. If your heartbeat seems uneven or fast, talk to your doctor.  When should you call for help?  Call 911 anytime you think you may need emergency care. For example, call if:  ?? You have symptoms of a heart attack. These may include:  ?? Chest pain or pressure, or a strange feeling in the chest.  ?? Sweating.  ?? Shortness of breath.  ?? Nausea or vomiting.  ?? Pain, pressure, or a strange feeling in the back, neck, jaw, or upper belly or in one or both shoulders or arms.  ?? Lightheadedness or sudden weakness.  ?? A fast or irregular heartbeat.  After you call 911, the operator may tell you to chew 1 adult-strength or 2 to 4 low-dose aspirin. Wait for an ambulance. Do not try to drive yourself.  ?? You have signs of a stroke. These may include:  ?? Sudden numbness, paralysis, or weakness in your face, arm, or leg, especially  on only one side of your body.  ?? New problems with walking or balance.  ?? Sudden vision changes.  ?? Drooling or slurred speech.  ?? New problems speaking or understanding simple statements, or feeling confused.  ?? A sudden, severe headache that is different from past headaches.  ?? You passed out (lost consciousness).  Call your doctor now or seek immediate medical care if:  ?? You have new or increased shortness of breath.  ?? You feel dizzy or lightheaded, or you feel like you may faint.  ?? Your heart rate becomes irregular.  ?? You can feel your heart flutter in your  chest or skip heartbeats. Tell your doctor if these symptoms are new or worse.  Watch closely for changes in your health, and be sure to contact your doctor if you have any problems.   Where can you learn more?   Go to [email or web address]  Enter U020 in the search box to learn more about "Atrial Fibrillation: After Your Visit."   ?? 2006-2014 [employer]. Care instructions adapted under license by [employer] (which disclaims liability or warranty for this information). This care instruction is for use with your licensed healthcare professional. If you have questions about a medical condition or this instruction, always ask your healthcare professional. [employer] disclaims any warranty or liability for your use of this information.  Content Version: 10.1.311062; Current as of: April 09, 2012

## 2012-10-28 NOTE — Progress Notes (Signed)
Pt has not been to the ER or hospital since opening this episode. Patient has followed up with PCP and/or speciality physicians. Closing episode.

## 2012-11-05 NOTE — Progress Notes (Signed)
Dylan Haney is a 79 y.o.  male  Issues discussed today include:    Follow up of chronic conditions, see diagnoses    Transition of Care documentation    Date of discharge:  09/15/2012 (admitted:  Oct 10, 2012)  Date of professional contact:  09/17/2012  Copy details of contact:  Below    Pt. listed on [facility] Report dated 09/16/12 went to [facility]. [facility] for Hypoglycemia. Patient is currently at [facility] facility. Called to ask family to notify office of patient discharge of Dylan Haney.    Pt has not been to the ER or hospital since opening this episode. Patient has followed up with PCP and/or speciality physicians. Closing episode.    FTF visit:  11/05/2012  Complexity of MDM:  moderate     Hospital Course:      Dylan Haney is a 79 y.o. male with a history of Afib, NASH and alcohol abuse who is admitted with falls, inability to walk. Pt reports several falls over the past few weeks, happen while walking, pt unsure of circumstances leading up to falls. Per daughter, pt is passing out and falling 2/2 alcohol intoxication. Pt has a long history of alcohol abuse, and had stopped for almost a year, but recently started again 2-3 months ago. Stopped drinking last time because he was falling. Currently he drinks 1 pint of vodka per day and thinks his last drink was 3 days ago. Denies any history of alcohol withdrawal, but daughter states he had some symptoms of withdrawal after stopping the last time.   In the ED, CT head was wnl. BG was 47, corrected to 60's with OJ. Pt unable to ambulate to bathroom with 2 assistants and walker.   Final discharge diagnoses and brief hospital course   1) Systolic CHF   - ECHO: EF 35-40% with moderate diffuse hypokinesis. Followed by Negative lexiscan Sestamibi study for inducible ischemia.   - f/u Cards in 1 month   - Continue lisinopril 5mg  daily   2.) Falls/Alcohol Abuse   - 2/2 etOH intoxication, was drinking 1 pint vodka daily.   - Placed on CIWA protocol during  admission, given Valium loading dose, did not go into DT's, no Ativan given in last 48 hours of admission   - Folate high and vitamin B12 wnl.   - Nutrition and protein diet important   3.) Hypoglycemia   - BG 47 on admission, corrected with OJs   - Stable in the low 100's throughout stay   - POC glucose and monitor symptoms   Labs/Imaging Needed on follow up:   POC Glucose   Follow up Cardiology 1 month    Data reviewed or ordered today:      Xr Spine Lumb 2 Or 3 V   10-10-2012 **Final Report** ICD Codes / Adm.Diagnosis: 251.2 E888.9 / Hypoglycemia, unspecified Unspecified fall Examination: CR L SPINE 2 OR 3 VWS - [id number] - Oct 10, 2012 4:53PM Accession No: [id number] Reason: point tenderness over lumbar spine, recent fall and now unable to walk REPORT: INDICATION: Back pain, recent fall. EXAM: Lumbar spine radiographs, 3 views. COMPARISON: None . FINDINGS: A three-view examination of the lumbar spine reveals 2-3 mm retrolisthesis at L4-5. Bone mineral content is normal for age . There is no obvious acute fracture or dislocation. Vertebral body height is mildly diminished anteriorly at L1, but with associated endplate sclerosis and spondylosis. Vertebral body heights are otherwise preserved. Disc space height is diminished at L4-5 and L5-S1.  There is multilevel endplate sclerosis and spondylosis. . Pedicles and sacroiliac joints are intact, as are visualized sacral foramina. Incidentally noted is heavy aortic calcification with ectasia but no aneurysm. IMPRESSION: No acute bony abnormality of the lumbar spine is confirmed. Mild loss of height anteriorly at L1 is likely chronic. Correlate with clinical symptoms. Grade 1 retrolisthesis with degenerative disc disease at L4-5. Aortic ectasia. Signing/Reading Doctor: Dylan Haney [phone]) Approved: Dylan Haney [phone]) 09-15-2012 5:10PM     Ct Head Wo Cont   09/15/12 **Final Report** ICD Codes / Adm.Diagnosis: 010272 12 / Fall Back Pain Examination: CT HEAD  WO CON - [id number] - September 15, 2012 12:00PM Accession No: [id number] Reason: trauma REPORT: EXAM: CT HEAD WO CON INDICATION: trauma COMPARISON: 10.25.2013. TECHNIQUE: Unenhanced CT of the head was performed using 5 mm images. Brain and bone windows were generated. FINDINGS: The ventricles and sulci are normal in size, shape and configuration and midline. There is stable mild periventricular white matter disease. There is no intracranial hemorrhage, extra-axial collection, mass, mass effect or midline shift. The basilar cisterns are open. No acute infarct is identified. The bone windows demonstrate no abnormalities. The visualized portions of the paranasal sinuses and mastoid air cells are clear. IMPRESSION: No acute process seen Signing/Reading Doctor: Dylan Haney [phone]) Approved: Dylan Haney [phone] 2012/09/15 12:16PM     Xr Chest [facility]   09-17-12 **Final Report** ICD Codes / Adm.Diagnosis: 251.2 E888.9 / Hypoglycemia, unspecified Unspecified fall Examination: CR CHEST PORT - [id number] - Sep 17, 2012 4:22PM Accession No: [id number] Reason: SNF Requirement REPORT: EXAM: CR CHEST PORT INDICATION: SNF Requirement COMPARISON: November 23, 2011 FINDINGS: A portable AP radiograph of the chest was obtained at 1610 hours. The patient is on a cardiac monitor. There is minimal subsegmental atelectasis at the right base. The cardiac and mediastinal contours and pulmonary vascularity are normal. There are degenerative changes of the acromioclavicular joints. IMPRESSION: No acute findings. Signing/Reading Doctor: Dylan Haney [phone]) ApprovedRaina Haney [phone]) September 17, 2012 4:33PM     Nm Cardiac [facility] Mult   Sep 17, 2012 **Final Report** ICD Codes / Adm.Diagnosis: 251.2 E888.9 / Hypoglycemia, unspecified Unspecified fall Examination: NM CARD SPECT MULT WALL EF - [id number] - 2012-09-17 10:42AM Accession No: [id number] Reason: Cardiomyopathy REPORT: One day lexiscan SPECT myocardial perfusion study [employer]:  Lexiscan: 0.4 mg Resting dose: 10.7 mCi Tc 73m Sestamibi Stress dose: 33.1 mCi Tc 19m Sestamibi Resting EKG: Afib, RBBB, LAFB Stress study: The stress was terminated secondary to lexiscan infusion completion. The patient denies chest discomfort with lexiscan infusion. Stress EKG: No significant ST-T wave changes. PVCs noted. Nuclear study: Rest and stress tomographic images demonstrate normal myocardial perfusion both during rest and during stress. There are no fixed or reversible defects noted. Gated images: Gated images demonstrate moderate inferior hypokinesis and septal akinesis vs paradoxical septal motion with an ejection fraction of 43%. IMPRESSION: 1. Subjectively and objectively negative lexiscan infusion. 2. Normal myocardial perfusion. 3. Moderate left ventricular systolic dysfunction (EF = 43%) with moderate inferior hypokinesis and septal akinesis vs paradoxical septal motion. 4. Negative lexiscan Sestamibi study for inducible ischemia. Signing/Reading Doctor: Shelda Altes [phone]) ApprovedShelda Altes [phone]) 17-Sep-2012 11:55AM     Since hospitalization, he has had no further falls.  He was in [facility] for 2 weeks for rehab.  He is now getting home health PT.  Once this is over, he will likely still benefit from formal outpatient PT for fall prevention.  His gait is still suspect.  He walks with cane.  I suggest no driving.      Other problems include:  Patient Active Problem List   Diagnosis Code   ??? Falls E888.9   ??? ETOH abuse 305.00   ??? Atrial fibrillation 427.31   ??? Elevated bilirubin 782.4   ??? Unspecified vitamin D deficiency 268.9   ??? Macrocytosis 289.89   ??? NASH (nonalcoholic steatohepatitis) 571.8   ??? Cardiomyopathy 425.4       Medications:  Current Outpatient Prescriptions   Medication Sig Dispense Refill   ??? fluoxetine (PROZAC) 10 mg capsule Take 1 capsule by mouth daily.  30 capsule  3   ??? folic acid 400 mcg tablet Take 400 mcg by mouth daily.       ??? thiamine (B-1) 100 mg tablet  Take 100 mg by mouth daily.       ??? cholecalciferol (VITAMIN D3) 1,000 unit tablet Take 1,000 Units by mouth daily.       ??? ibuprofen (ADVIL) 200 mg tablet Take 600 mg by mouth two (2) times daily as needed for Pain.       ??? aspirin 81 mg chewable tablet Take 1 Tab by mouth daily.  90 Tab  3       Allergies:  No Known Allergies    LMP:  No LMP for male patient.    History     Social History   ??? Marital Status: WIDOWED     Spouse Name: N/A     Number of Children: N/A   ??? Years of Education: N/A     Occupational History   ??? Not on file.     Social History Main Topics   ??? Smoking status: Former Smoker   ??? Smokeless tobacco: Never Used   ??? Alcohol Use: 0.0 oz/week      Comment: about pint of vodka per day grandson   ??? Drug Use: No   ??? Sexually Active: Not on file     Other Topics Concern   ??? Not on file     Social History Narrative   ??? No narrative on file         History reviewed. No pertinent family history.  Other Family History:  Grand daughter lives with him now    Meaningful use:  Done    Review of Systems    Patient denies problems with    Headaches,  Chest pain, Shortness of breath, fevers, Hearing/vision, speaking/swallowing, Reflux/indigestion, Cough,Diarrhea/constipation,Problems passing or controlling urine,  Sexual function, Mood (anxiety/depression), Snoring/sleep,Fatigue, Weight change/Appetite                                                            Any Positive ROS include: some anxiety, denies depression, no SI/HI    , +prostatism, +nocturia, +HOH (declines seeing ENT),     Falls in the past 12 months:  yes           Exercise:  Walks with cane             Smoking history:  former                              No LMP for male patient.  Physical Exam  BP 92/60   Pulse 88   Temp(Src) 97.7 ??F (36.5 ??C) (Oral)   Resp 16   Ht 5\' 8"  (1.727 m)   Wt 144 lb (65.318 kg)   BMI 21.9 kg/m2   SpO2 98%  BP Readings from Last 3 Encounters:   11/05/12 92/60   10/13/12 96/76   09/16/12 157/80       Constitutional:   Appears well,  No Acute Distress, Vitals noted  Psychiatric:   Affect normal, Alert and Oriented to person/place/time    Eyes:   Pupils equally round and reactive, EOMI, conjunctiva clear, eyelids normal  ENT:   External ears and nose normal/lips, teeth=dentures OK/gums normal, TMs and Oropharynx normal  Neck:   general inspection and Thyroid normal.  No abnormal cervical or supraclavicular nodes    Lungs:   clear to auscultation, good respiratory effort  Heart:   Auscultation normal.  Regular rhythm.  No cardiac murmurs.  No carotid bruits or palpable thrills  Chest wall normal    Abdominal exam:   normal.  Liver and spleen normal.  No bruits/masses/tenderness    Extremities:   without edema, good peripheral pulses  Skin:   Warm to palpation, without rashes, some bruising, or suspicious lesions     Neuro:  No facial droop, speech fluent, EOMI, Pupils equally round and reactive to light, visual fields seem OK, +HOH, smile symmetrical, puffs out cheeks symmetrically    Shoulder shrug symmetrical     moves all extremities, strength/sensationseem intact and symmetrical    Rapid alternating movements of hands normal and symmetrical    balance seems at risk, no pronator drift, gait abnormal/walks with cane. "get up and go" test only fair/fall risk    squats OK, heel standing/toe standing OK    no tenderness of C spine, T spine, LS spine, flexion/extension of spine OK    Affect seems appropriate, no obvious mental processing problems          Assessment:    Patient Active Problem List   Diagnosis Code   ??? Falls E888.9   ??? ETOH abuse 305.00   ??? Atrial fibrillation 427.31   ??? Elevated bilirubin 782.4   ??? Unspecified vitamin D deficiency 268.9   ??? Macrocytosis 289.89   ??? NASH (nonalcoholic steatohepatitis) 571.8   ??? Cardiomyopathy 425.4       Today's diagnoses are:    ICD-9-CM    1. Gait instability 781.2     gettin [facility] PT now, may need outpatient PT when [facility] done   2. Fall E888.9    3. ETOH abuse 305.00    4. UTI (lower  urinary tract infection) 599.0 CULTURE, URINE     PROSTATE SPECIFIC AG (PSA)   5. Anxiety 300.00 fluoxetine (PROZAC) 10 mg capsule       Plan:  Orders Placed This Encounter   ??? CULTURE, URINE   ??? PROSTATE SPECIFIC AG   ??? fluoxetine (PROZAC) 10 mg capsule     Sig: Take 1 capsule by mouth daily.     Dispense:  30 capsule     Refill:  3       See patient instructions  Patient Instructions    I suggest no driving.    Consider outpatient physical therapy for fall prevention and gait stability once home heath PT is finished    No alcohol    Obtain flu shot at a local pharmacy soon    Obtain PCV 13 pneumonia vaccine  Medicare Wellness Exam every August    Do not take lisinopril    BP check in 2-4 weeks    Start fluoxetine to help with your nerves    Recheck in 2- 4 weeks    FTF for [facility] done today          refresh note:  done    AVS Printed:  done

## 2012-11-05 NOTE — Progress Notes (Signed)
Chief Complaint   Patient presents with   ??? Hospital Follow Up     Depression screen done  Fall screen done  Travel screen done  Questionnaire done  Reviewed record in preparation for visit and have obtained necessary documentation.

## 2012-11-05 NOTE — Patient Instructions (Addendum)
I suggest no driving.    Consider outpatient physical therapy for fall prevention and gait stability once home heath PT is finished    No alcohol    Obtain flu shot at a local pharmacy soon    Obtain PCV 13 pneumonia vaccine    Medicare Wellness Exam every August    Do not take lisinopril    BP check in 2-4 weeks    Start fluoxetine to help with your nerves    Recheck in 2- 4 weeks    FTF for [facility] done today

## 2012-11-07 LAB — CULTURE, URINE

## 2012-11-07 NOTE — Addendum Note (Signed)
Addended by: Junious Dresser on: 11/07/2012 02:29 PM     Modules accepted: Level of Service

## 2012-11-10 ENCOUNTER — Encounter

## 2012-11-10 NOTE — Progress Notes (Signed)
Quick Note:    +UTI. Rx sent to his [facility] pharmacy for 10 days. Recheck a urine Culture after antibiotics.  ______

## 2012-11-25 NOTE — Progress Notes (Signed)
Chief Complaint   Patient presents with   ??? Documentation     sent to [place] from [facility] back.  If the pt wants [place] to release this information to them, please have the pt contact us.

## 2012-12-01 NOTE — Telephone Encounter (Signed)
Pt's daughter, Roxy Horseman, states pt will want to discuss being able to still drive with Dr. Jean Rosenthal during f/up today. Daughter would like to inform Dr. Jean Rosenthal that she does not think it will be safe for pt to continue to drive. Best number to reach daughter if needed, is [phone].   ------

## 2012-12-01 NOTE — Patient Instructions (Addendum)
Instructions for patient:    In general, I advise patients to be as active as possible.  I believe exercise is the key to long life and good health.  The current recommendation is for individuals to exercise for 150 minutes each week (in other words 30 minutes 5 days a week).  Exercise should be vigorous enough to work up a sweat.  These activities include brisk walking, running, tennis, swimming, weight-lifting, etc.     I usually tell folks that work is work and exercise is exercise.  Each of these activities has a different goal.  Even though you may be active at work, it may not be aerobically adequate.  So build dedicated exercise time into your weekly routine.    You may also wish to talk to the nutritionist at [facility]  [phone]      In general, a healthful diet includes:  1. more fruits and vegetables (grown locally, organic if possible)  2. keep meat to one meal a day (not all three meals)  3. keep meat portion size to equal a deck of cards  4. eat more greens and beans and foods with natural color (like blueberries)  5. eat whole foods (think ???what would a cave-man eat?)  6. drink plenty of water  7. consider sodas and sugary juices as treats (think ???liquid candy???)    If a patient drinks alcohol, I suggest that a male drink no more than 2 beers (or glasses of wine, or shots of liquor) in any 24 hour period ( and not daily).  For females, the limits are one drink per 24 hours (and not daily).  After these limits, the toxic effects of alcohol consumption start to manifest.     Avoid tobacco products.    I routinely suggest a complete physical exam once each year (your birth month).    Yearly flu shot    [facility] [phone] or  [facility] [phone]  Physical Therapy evaluate and treat:  Fall prevention and leg weakness    Please call [name] to help arrange and authorize your tests and/or referrals.  Her # is [phone]     I still suggest no driving

## 2012-12-01 NOTE — Progress Notes (Signed)
Chief Complaint   Patient presents with   ??? Blood Pressure Check     follow up on BP     Reviewed record in preparation for visit and have obtained necessary documentation.

## 2012-12-01 NOTE — Telephone Encounter (Signed)
Message copied by Hyman Hopes on Mon Dec 01, 2012  1:57 PM  ------       Message from: Vilinda Flake       Created: Mon Dec 01, 2012  1:52 PM       Regarding: Dr. Val Riles         Pt's daughter, Roxy Horseman, states pt will want to discuss being able to still drive with Dr. Jean Rosenthal during f/up today. Daughter would like to inform Dr. Jean Rosenthal that she does not think it will be safe for pt to continue to drive. Best number to reach daughter if needed, is [phone].   ------

## 2012-12-01 NOTE — Progress Notes (Signed)
Dylan Haney is a 79 y.o.  male  Issues discussed today include:    Follow up of chronic conditions, see diagnoses    Last visit:  I suggest no driving.   Consider outpatient physical therapy for fall prevention and gait stability once home heath PT is finished   No alcohol   Obtain flu shot at a local pharmacy soon   Obtain PCV 13 pneumonia vaccine   Medicare Wellness Exam every August   Do not take lisinopril   BP check in 2-4 weeks   Start fluoxetine to help with your nerves   Recheck in 2- 4 weeks   FTF for [facility] done today    BP check:  Better off of lisinopril, he feels better    Nerves better on fluoxetine    Wellness:  Health Maintenance    Male:  PSA:  2.2 2014    Colonoscopy:  Never had and not interested  FOBT:  Not indicated  Glaucoma screen:  2014  Tdap:  2011  Pneumovax:  2013  Flu shot:  2014  Zostavax:  done  EKG documentation: on file  Advanced Directives:  discussed fll code  Face to face meeting to document need for DME, home health, therapy:  done: none, needs PT for strength  Updated specialist:  done, see problem list    Update PMH:  done    Review med list:  done    Data reviewed or ordered today:  Urine culture    Other problems include:  Patient Active Problem List   Diagnosis Code   ??? Falls E888.9   ??? ETOH abuse 305.00   ??? Atrial fibrillation 427.31   ??? Elevated bilirubin 782.4   ??? Unspecified vitamin D deficiency 268.9   ??? Macrocytosis 289.89   ??? NASH (nonalcoholic steatohepatitis) 571.8   ??? Cardiomyopathy 425.4   ??? Health care maintenance V70.0       Medications:  Current Outpatient Prescriptions   Medication Sig Dispense Refill   ??? fluoxetine (PROZAC) 10 mg capsule Take 1 capsule by mouth daily.  30 capsule  3   ??? folic acid 400 mcg tablet Take 1 tablet by mouth daily.  30 tablet  11   ??? thiamine (B-1) 100 mg tablet Take 1 tablet by mouth daily.  30 tablet  3   ??? cholecalciferol (VITAMIN D3) 1,000 unit tablet Take 1 tablet by mouth daily.  30 tablet  11   ??? aspirin 81 mg chewable tablet  Take 1 Tab by mouth daily.  90 Tab  3       Allergies:  No Known Allergies    LMP:  No LMP for male patient.    History     Social History   ??? Marital Status: WIDOWED     Spouse Name: N/A     Number of Children: N/A   ??? Years of Education: N/A     Occupational History   ??? Not on file.     Social History Main Topics   ??? Smoking status: Former Smoker   ??? Smokeless tobacco: Never Used   ??? Alcohol Use: 0.0 oz/week      Comment: about pint of vodka per day grandson   ??? Drug Use: No   ??? Sexually Active: Not on file     Other Topics Concern   ??? Not on file     Social History Narrative   ??? No narrative on file  History reviewed. No pertinent family history.      Meaningful use:  Done    Review of Systems    Patient denies problems with    Headaches,  Chest pain, Shortness of breath, fevers, speaking/swallowing, Reflux/indigestion, Cough,Diarrhea/constipation,Mood (anxiety/depression stable on Rx), Snoring/sleep,Fatigue, Weight change/Appetite                                                            Any Positive ROS include: dentures, deaf left ear,  Cataract OS,   prostatism     Falls in the past 12 months:  Yes but none since hospitalization           Exercise:  Needs continued PT for leg weakness             Smoking history:  former                                No LMP for male patient.    Physical Exam  BP 111/70   Pulse 72   Temp(Src) 97.9 ??F (36.6 ??C) (Oral)   Resp 16   Ht 5\' 8"  (1.727 m)   Wt 147 lb (66.679 kg)   BMI 22.36 kg/m2   SpO2 97%  BP Readings from Last 3 Encounters:   12/01/12 111/70   11/05/12 92/60   10/13/12 96/76       Constitutional:  Appears well,  No Acute Distress, Vitals noted  Psychiatric:   Affect normal, Alert and Oriented to person/place/time    Eyes:   Pupils equally round and reactive, EOMI, conjunctiva clear, eyelids normal  ENT:   External ears and nose normal/lips, teeth=dentures OK/gums normal, TMs and Oropharynx normal  Neck:   general inspection and Thyroid normal.  No abnormal  cervical or supraclavicular nodes    Lungs:   clear to auscultation, good respiratory effort  Heart:   Auscultation normal.  Regular rhythm.  No cardiac murmurs.  No carotid bruits or palpable thrills  Chest wall normal    Abdominal exam:   normal.  Liver and spleen normal.  No bruits/masses/tenderness    GU:  defer    Extremities:   without edema, good peripheral pulses  Skin:   Warm to palpation, without rashes, bruising, or suspicious lesions     Neuro:  No facial droop, speech fluent, EOMI, Pupils equally round and reactive to light, visual fields seem OK, HOH, smile symmetrical, puffs out cheeks symmetrically    Shoulder shrug symmetrical     moves all extremities, strength/sensationseem intact and symmetrical in UE, legs seem weak    Rapid alternating movements of hands normal and symmetrical    balance seems at rsik, no pronator drift, gait fairly normal. "get up and go" test only fair, I had to help him up to exam table    squats OK, heel standing/toe standing OK    no tenderness of C spine, T spine, LS spine, flexion/extension of spine OK    Affect seems appropriate, no obvious mental processing problems    MSK:  He is generally stiff, +OA changes both hands        Assessment:    Patient Active Problem List   Diagnosis Code   ??? Falls E888.9   ???  ETOH abuse 305.00   ??? Atrial fibrillation 427.31   ??? Elevated bilirubin 782.4   ??? Unspecified vitamin D deficiency 268.9   ??? Macrocytosis 289.89   ??? NASH (nonalcoholic steatohepatitis) 571.8   ??? Cardiomyopathy 425.4   ??? Health care maintenance V70.0       Today's diagnoses are:    ICD-9-CM    1. Routine general medical examination at a health care facility V70.0    2. Cardiomyopathy 425.4     BP better off lisinopril   3. Risk for falls V15.88 REFERRAL TO PHYSICAL THERAPY   4. Leg weakness 729.89 REFERRAL TO PHYSICAL THERAPY   5. UTI (lower urinary tract infection) 599.0 CULTURE, URINE       Plan:  Orders Placed This Encounter   ??? CULTURE, URINE   ??? REFERRAL TO  PHYSICAL THERAPY     Referral Priority:  Routine     Referral Type:  PT/OT/ST     Referral Reason:  Specialty Services Required       See patient instructions  Patient Instructions       Instructions for patient:    In general, I advise patients to be as active as possible.  I believe exercise is the key to long life and good health.  The current recommendation is for individuals to exercise for 150 minutes each week (in other words 30 minutes 5 days a week).  Exercise should be vigorous enough to work up a sweat.  These activities include brisk walking, running, tennis, swimming, weight-lifting, etc.     I usually tell folks that work is work and exercise is exercise.  Each of these activities has a different goal.  Even though you may be active at work, it may not be aerobically adequate.  So build dedicated exercise time into your weekly routine.    You may also wish to talk to the nutritionist at [facility]  [phone]      In general, a healthful diet includes:  1. more fruits and vegetables (grown locally, organic if possible)  2. keep meat to one meal a day (not all three meals)  3. keep meat portion size to equal a deck of cards  4. eat more greens and beans and foods with natural color (like blueberries)  5. eat whole foods (think ???what would a cave-man eat?)  6. drink plenty of water  7. consider sodas and sugary juices as treats (think ???liquid candy???)    If a patient drinks alcohol, I suggest that a male drink no more than 2 beers (or glasses of wine, or shots of liquor) in any 24 hour period ( and not daily).  For females, the limits are one drink per 24 hours (and not daily).  After these limits, the toxic effects of alcohol consumption start to manifest.     Avoid tobacco products.    I routinely suggest a complete physical exam once each year (your birth month).    Yearly flu shot    [facility] [phone] or  [facility] [phone]  Physical Therapy evaluate and treat:  Fall prevention  and leg weakness    Please call [name] to help arrange and authorize your tests and/or referrals.  Her # is [phone]                   refresh note:  done    AVS Printed:  done

## 2012-12-02 NOTE — Telephone Encounter (Signed)
Patient seen by physician of appt 12/01/12.

## 2013-01-13 NOTE — Patient Instructions (Addendum)
MyChart Activation    Thank you for requesting access to MyChart. Please follow the instructions below to securely access and download your online medical record. MyChart allows you to send messages to your doctor, view your test results, renew your prescriptions, schedule appointments, and more.    How Do I Sign Up?    1. In your internet browser, go to [email or web address]  2. Click on the First Time User? Click Here link in the Sign In box. You will be redirect to the New Member Sign Up page.  3. Enter your MyChart Access Code exactly as it appears below. You will not need to use this code after you???ve completed the sign-up process. If you do not sign up before the expiration date, you must request a new code.    MyChart Access Code: [email or web address]  Expires: 04/13/2013  1:03 PM (This is the date your MyChart access code will expire)    4. Enter the last four digits of your Social Security Number (xxxx) and Date of Birth (mm/dd/yyyy) as indicated and click Submit. You will be taken to the next sign-up page.  5. Create a MyChart ID. This will be your MyChart login ID and cannot be changed, so think of one that is secure and easy to remember.  6. Create a MyChart password. You can change your password at any time.  7. Enter your Password Reset Question and Answer. This can be used at a later time if you forget your password.   8. Enter your e-mail address. You will receive e-mail notification when new information is available in MyChart.  9. Click Sign Up. You can now view and download portions of your medical record.  10. Click the Download Summary menu link to download a portable copy of your medical information.    Additional Information    If you have questions, please visit the Frequently Asked Questions section of the MyChart website at [email or web address] Remember, MyChart is NOT to be used for urgent needs. For medical emergencies, dial 911.        Atrial Fibrillation: After Your  Visit  Your Care Instructions     Atrial fibrillation is an irregular and often fast heartbeat. Treating this condition is important for several reasons. It can cause blood clots, which can travel from your heart to your brain and cause a stroke. If you have a fast heartbeat, you may feel lightheaded, dizzy, and weak. An irregular heartbeat can also increase your risk for heart failure.  Atrial fibrillation is often the result of another heart condition, such as high blood pressure or coronary artery disease. Making changes to improve your heart condition will help you stay healthy and active.  Follow-up care is a key part of your treatment and safety. Be sure to make and go to all appointments, and call your doctor if you are having problems. It's also a good idea to know your test results and keep a list of the medicines you take.  How can you care for yourself at home?  Medicines  ?? Take your medicines exactly as prescribed. Call your doctor if you think you are having a problem with your medicine. You will get more details on the specific medicines your doctor prescribes.  ?? If your doctor has given you a blood thinner to prevent a stroke, be sure you get instructions about how to take your medicine safely. Blood thinners can cause serious bleeding problems.  ?? Do not take any vitamins, over-the-counter drugs, or herbal  products without talking to your doctor first.  Lifestyle changes  ?? Do not smoke. Smoking can increase your chance of a stroke and heart attack. If you need help quitting, talk to your doctor about stop-smoking programs and medicines. These can increase your chances of quitting for good.  ?? Eat a balanced diet that is low in fat and cholesterol.  ?? Limit alcohol to 2 drinks a day for men and 1 drink a day for women. Too much alcohol can cause health problems.  ?? Avoid colds and flu. Get a pneumococcal vaccine shot. If you have had one before, ask your doctor whether you need another dose. Get a  flu shot every year. If you must be around people with colds or flu, wash your hands often.  Activity  ?? If your doctor recommends it, get more exercise. Walking is a good choice. Bit by bit, increase the amount you walk every day. Try for at least 30 minutes on most days of the week. You also may want to swim, bike, or do other activities. Your doctor may suggest that you join a cardiac rehabilitation program so that you can have help increasing your physical activity safely.  ?? Start light exercise if your doctor says it is okay. Even a small amount will help you get stronger, have more energy, and manage stress. Walking is an easy way to get exercise. Start out by walking a little more than you did in the hospital. Gradually increase the amount you walk.  ?? When you exercise, watch for signs that your heart is working too hard. You are pushing too hard if you cannot talk while you are exercising. If you become short of breath or dizzy or have chest pain, sit down and rest immediately.  ?? Check your pulse regularly. Place two fingers on the artery at the palm side of your wrist, in line with your thumb. If your heartbeat seems uneven or fast, talk to your doctor.  When should you call for help?  Call 911 anytime you think you may need emergency care. For example, call if:  ?? You have symptoms of a heart attack. These may include:  ?? Chest pain or pressure, or a strange feeling in the chest.  ?? Sweating.  ?? Shortness of breath.  ?? Nausea or vomiting.  ?? Pain, pressure, or a strange feeling in the back, neck, jaw, or upper belly or in one or both shoulders or arms.  ?? Lightheadedness or sudden weakness.  ?? A fast or irregular heartbeat.  After you call 911, the operator may tell you to chew 1 adult-strength or 2 to 4 low-dose aspirin. Wait for an ambulance. Do not try to drive yourself.  ?? You have signs of a stroke. These may include:  ?? Sudden numbness, paralysis, or weakness in your face, arm, or leg, especially  on only one side of your body.  ?? New problems with walking or balance.  ?? Sudden vision changes.  ?? Drooling or slurred speech.  ?? New problems speaking or understanding simple statements, or feeling confused.  ?? A sudden, severe headache that is different from past headaches.  ?? You passed out (lost consciousness).  Call your doctor now or seek immediate medical care if:  ?? You have new or increased shortness of breath.  ?? You feel dizzy or lightheaded, or you feel like you may faint.  ?? Your heart rate becomes irregular.  ?? You can feel your heart flutter in   your chest or skip heartbeats. Tell your doctor if these symptoms are new or worse.  Watch closely for changes in your health, and be sure to contact your doctor if you have any problems.   Where can you learn more?   Go to http://www.healthwise.net/[facility]  Enter U020 in the search box to learn more about "Atrial Fibrillation: After Your Visit."   ?? 2006-2014 [employer]. Care instructions adapted under license by [employer] (which disclaims liability or warranty for this information). This care instruction is for use with your licensed healthcare professional. If you have questions about a medical condition or this instruction, always ask your healthcare professional. Healthwise, Incorporated disclaims any warranty or liability for your use of this information.  Content Version: 10.2.346038; Current as of: April 09, 2012

## 2013-01-13 NOTE — Progress Notes (Signed)
Subjective:     Problem List Date Reviewed: 01/13/2013        ICD-9-CM Class Noted    Health care maintenance V70.0  12/01/2012    Overview    Signed 12/01/2012  5:14 PM by Jennye Boroughs, MD      Wellness:  Health Maintenance    Male:  PSA:  2.2 2014    Colonoscopy:  Never had and not interested  FOBT:  Not indicated  Glaucoma screen:  2014  Tdap:  2011  Pneumovax:  2013  Flu shot:  2014  Zostavax:  done  EKG documentation: on file  Advanced Directives:  discussed fll code  Face to face meeting to document need for DME, home health, therapy:  done: none, needs PT for strength  Updated specialist:  done, see problem list    Update PMH:  done    Review med list:  done          Cardiomyopathy 425.4  09/11/2012    Overview    Addendum 12/01/2012  5:07 PM by Jennye Boroughs, MD      Dr. Elmon Kirschner  A.  Echo (09/11/12):  EF 35-40% with mod GHK.  Mildly dil RV.  Mod dil RA/LA.  Mild MR.  Mod-severe TR.  Mod PR.  PASP 60.  B.  Lexiscan Cardiolite (09/12/12):  Normal perfusion, EF 43% with mod inf HK and septal AK vs PSM.          NASH (nonalcoholic steatohepatitis) 571.8  12/19/2011    Overview    Signed 12/19/2011  4:30 PM by Jennye Boroughs, MD      [place] 2013          Macrocytosis 289.89  12/11/2011    Overview    Signed 12/11/2011  2:23 PM by Jennye Boroughs, MD      B12 and folate normal  Uses ETOH          Elevated bilirubin 782.4  11/28/2011    Overview    Addendum 12/01/2012  4:53 PM by Jennye Boroughs, MD      He could have Gilbert's syndrome although he uses ETOH daily            Unspecified vitamin D deficiency 268.9  11/28/2011    Overview    Signed 11/28/2011  2:54 PM by Jennye Boroughs, MD      Needs Rx          Falls [phone]  11/27/2011    Overview    Signed 11/27/2011  1:41 PM by Jennye Boroughs, MD      Head CT:  No Intracranial Disease Evident on Head CT.            ETOH abuse 305.00  11/27/2011        Atrial fibrillation 427.31  11/27/2011              Mr. Rocks is an 79 y.o. man with the above past medical  history, who presents for follow up.  He is doing well off of his Lisinopril.  He was having problems with fatigue and lightheadedness on Lisinopril, but we stopped this and he feels much better.  He denies any chest pain, chest discomfort, shortness of breath, dyspnea on exertion, orthopnea, paroxysmal nocturnal dyspnea, lower extremity swelling, palpitations, syncope or near syncope.           History   Smoking status   ??? Former Smoker   Smokeless tobacco   ???  Never Used       Current Outpatient Prescriptions   Medication Sig Dispense Refill   ??? fluoxetine (PROZAC) 10 mg capsule Take 1 capsule by mouth daily.  30 capsule  3   ??? folic acid 400 mcg tablet Take 1 tablet by mouth daily.  30 tablet  11   ??? thiamine (B-1) 100 mg tablet Take 1 tablet by mouth daily.  30 tablet  3   ??? cholecalciferol (VITAMIN D3) 1,000 unit tablet Take 1 tablet by mouth daily.  30 tablet  11   ??? aspirin 81 mg chewable tablet Take 1 Tab by mouth daily.  90 Tab  3       Objective:   BP 132/70   Pulse 80   Resp 20   Ht 5\' 8"  (1.727 m)   Wt 149 lb 9.6 oz (67.858 kg)   BMI 22.75 kg/m2   SpO2 97%    HEENT Exam:     Normocephalic, atraumatic. EOMI. Oropharynx negative. Neck supple. No lymphadenopathy.          Lung Exam:     The patient is not dyspneic. There is no cough. The lungs are clear to percussion. Breath sounds are heard equally in all lung fields. There are no wheezes, rales, rhonchi, or rubs heard on auscultation.          Heart Exam:     The rhythm is irregularly irregular. The PMI is in the 5th intercostal space of the MCL. Apical impulse is normal. S1 is regular. S2 is physiologic. There is no S3, S4 gallop, murmur, click, or rub.          Abdomen Exam:     Bowel sounds are normoactive. Abdomen benign.         Extremities Exam:     The extremities are atraumatic appearing. There is no clubbing, cyanosis, edema, ulcers, varicose veins, rash, swelling, erythemia noted in the extremities. The neurovascular status is grossly intact with  normal distal sensation and pulses.          Vascular Exam:     The radial, brachial, dorsalis pedis, posterior tibial, are equal and strong bilaterally The carotids are equal bilaterally without bruits.              EKG: AF @ 81, RBBB, LAFB, PVCs.     Assessment/Plan:     Mr. Luna appears stable from a cardiac standpoint.  We are going to stay the course with his current medication regimen, and he is going to follow up with me in one year's time.  We discussed stroke risks in atrial fibrillation, but also has had a history of frequent falling, so we are avoiding anticoagulation at this time.  If he can demonstrate that he is not a fall risk anymore we may wish to start him on an anticoagulant.    Plan:  1. Continue outpatient medication regimen.  2. Follow up with me in one year's time.   Diet and exercise.   Call my office, call his primary care physician, or return to the hospital should any concerning symptomatology arise.      Mr. Ayala indicated that he understood this plan and wished to proceed ahead.            Patient Care Team:  Jennye Boroughs, MD as PCP - General (Family Practice)  Shelda Altes, MD as Physician (Cardiology)

## 2013-02-02 NOTE — Progress Notes (Signed)
Dylan Haney is a 80 y.o.  male  Issues discussed today include:    Follow up of chronic conditions, see diagnoses    Patient has had no alcohol since 08/2012.  He wants to drive again.  He has followed my instruction not to drive.  He is finished HH PT    Data reviewed or ordered today:  Refer for driving evaluation    Other problems include:  Patient Active Problem List   Diagnosis Code   ??? Falls E888.9   ??? ETOH abuse 305.00   ??? Atrial fibrillation 427.31   ??? Elevated bilirubin 782.4   ??? Unspecified vitamin D deficiency 268.9   ??? Macrocytosis 289.89   ??? NASH (nonalcoholic steatohepatitis) 571.8   ??? Cardiomyopathy 425.4   ??? Health care maintenance V70.0   ??? Advance directive discussed with patient V65.49       Medications:  Current Outpatient Prescriptions   Medication Sig Dispense Refill   ??? fluoxetine (PROZAC) 10 mg capsule Take 1 capsule by mouth daily.  30 capsule  3   ??? folic acid 400 mcg tablet Take 1 tablet by mouth daily.  30 tablet  11   ??? thiamine (B-1) 100 mg tablet Take 1 tablet by mouth daily.  30 tablet  3   ??? cholecalciferol (VITAMIN D3) 1,000 unit tablet Take 1 tablet by mouth daily.  30 tablet  11   ??? aspirin 81 mg chewable tablet Take 1 Tab by mouth daily.  90 Tab  3       Allergies:  No Known Allergies    LMP:  No LMP for male patient.    History     Social History   ??? Marital Status: WIDOWED     Spouse Name: N/A     Number of Children: N/A   ??? Years of Education: N/A     Occupational History   ??? Not on file.     Social History Main Topics   ??? Smoking status: Former Smoker   ??? Smokeless tobacco: Never Used   ??? Alcohol Use: No      Comment: Patient stated he quit Aug 2013,,,,,,about pint of vodka per day grandson,    ??? Drug Use: No   ??? Sexually Active: Not on file     Other Topics Concern   ??? Not on file     Social History Narrative   ??? No narrative on file         History reviewed. No pertinent family history.      Meaningful use:  done      ROS:  The patient denies:  Headches/Chest  Pain/SOB/fevers  Any positive ROS include: he denies ETOH, RIH    No LMP for male patient.    Physical Exam  BP 117/57   Pulse 80   Temp(Src) 97.6 ??F (36.4 ??C) (Oral)   Resp 16   Ht 5\' 8"  (1.727 m)   Wt 147 lb 12.8 oz (67.042 kg)   BMI 22.48 kg/m2  BP Readings from Last 3 Encounters:   02/02/13 117/57   01/13/13 132/70   12/01/12 111/70       Constitutional:  Appears well,  No Acute Distress, Vitals noted  Psychiatric:   Affect normal, Alert and Oriented to person/place/time    Eyes:   Pupils equally round and reactive, EOMI, conjunctiva clear, eyelids normal  ENT:   External ears and nose normal/lips, teeth=OK/gums normal, TMs and Oropharynx normal  Neck:   general  inspection and Thyroid normal.  No abnormal cervical or supraclavicular nodes    Lungs:   clear to auscultation, good respiratory effort  Heart:   Auscultation normal.  Rhythm seems regular today.  No carotid bruits or palpable thrills  Chest wall normal    Abdominal exam:   normal.  Liver and spleen normal.  No bruits/masses/tenderness    Extremities:   without edema, good peripheral pulses  Skin:   Warm to palpation, without rashes, bruising, or suspicious lesions     Neuro:  No facial droop, speech fluent, EOMI, Pupils equally round and reactive to light, visual fields seem OK, hearing seems normal and symmetrical,smile symmetrical, puffs out cheeks symmetrically    Shoulder shrug symmetrical     moves all extremities, strength/sensationseem intact and symmetrical    Rapid alternating movements of hands normal and symmetrical    balance seems OK, no pronator drift, gait normal. "get up and go" test OK    squats OK, heel standing/toe standing OK    no tenderness of C spine, T spine, LS spine, flexion/extension of spine OK    Affect seems appropriate, no obvious mental processing problems    MSK:  Full ROM all joints, some OA of hands                Assessment:    Patient Active Problem List   Diagnosis Code   ??? Falls E888.9   ??? ETOH abuse 305.00   ???  Atrial fibrillation 427.31   ??? Elevated bilirubin 782.4   ??? Unspecified vitamin D deficiency 268.9   ??? Macrocytosis 289.89   ??? NASH (nonalcoholic steatohepatitis) 571.8   ??? Cardiomyopathy 425.4   ??? Health care maintenance V70.0   ??? Advance directive discussed with patient V65.49       Today's diagnoses are:    ICD-9-CM    1. ETOH abuse 305.00     no ETOH sicne 08/2012   2. Need for influenza vaccination V04.81 INFLUENZA VIRUS VAC QUAD,SPLIT,PRESV,FREE 3/> YRS IM   3. Atrial fibrillation 427.31     saw Dr. Elmon KirschnerXenakis 12/2012   4. Falls, subsequent encounter [phone]     s/p PT.  he wants to drive again.  will send for formal evaluation   5. Hernia 553.9 REFERRAL TO SURGERY    [facility]       Plan:  Orders Placed This Encounter   ??? Influenza vaccine, Quadrivalent, PF, 3/> yrs, IM   ??? REFERRAL TO SURGERY     Referral Priority:  Routine     Referral Type:  Consultation     Referral Reason:  Specialty Services Required     Referral Location:  [facility]     Referred to Provider:  Guinevere FerrariJoseph Karch, MD       See patient instructions  Patient Instructions   Body mass index is 22.48 kg/(m^2).    Drink ensure daily    Continue your vitamins    Continue to avoid all alcohol    Consultants    For driving evaluation #[phone].    See Dr. Lenard ForthKarch for surgical evaluation of hernia [phone]    Please call Garlan FairMinnie to help arrange and authorize your tests and/or referrals.  Her # is [phone]                     refresh note:  done    AVS Printed:  done      The majority of today's visit was counseling  or coordination of care 25 minutes.     Total face to face time:  25  Time spent counseling:  20  Subject:  See diagnoses.

## 2013-02-02 NOTE — Patient Instructions (Addendum)
Body mass index is 22.48 kg/(m^2).    Drink ensure daily    Continue your vitamins    Continue to avoid all alcohol    Consultants    For driving evaluation #[phone].    See Dr. Lenard ForthKarch for surgical evaluation of hernia [phone]    Please call Garlan FairMinnie to help arrange and authorize your tests and/or referrals.  Her # is [phone]

## 2013-02-02 NOTE — Progress Notes (Signed)
Chief Complaint   Patient presents with   ??? Immunization/Injection     Influenza Vaccine left deltoid no adverse reaction   ??? Other     health care maintance     1. Have you been to the ER, urgent care clinic since your last visit?  Hospitalized since your last visit?No    2. Have you seen or consulted any other health care providers outside of the [facility] since your last visit?  Include any pap smears or colon screening. No    Reviewed record in preparation for visit and have obtained necessary documentation.

## 2013-03-16 MED ORDER — VITAMIN B-1 100 MG TABLET
100 mg | ORAL_TABLET | ORAL | Status: DC
Start: 2013-03-16 — End: 2013-05-20

## 2013-04-27 NOTE — Patient Instructions (Addendum)
arrange driving evaluation #[phone]     If you pass the driving evaluation, I am OK with limited daytime driving        In general, I advise patients to be as active as possible.  I believe exercise is the key to long life and good health.  The current recommendation is for individuals to exercise for 150 minutes each week (in other words 30 minutes 5 days a week).  Exercise should be vigorous enough to work up a sweat.  These activities include brisk walking, running, tennis, swimming, weight-lifting, etc.     I usually tell folks that work is work and exercise is exercise.  Each of these activities has a different goal.  Even though you may be active at work, it may not be aerobically adequate.  So build dedicated exercise time into your weekly routine.    If a patient drinks alcohol, I suggest that a male drink only 2 beers (or glasses of wine, or shots of liquor) in any 24 hour period ( and not daily).  For females, the limits are one drink per 24 hours (and not daily).  After these limits, the toxic effects of alcohol consumption start to manifest.     Avoid tobacco products.    I suggest a wellness exam yearly during your birth month to update health maintenance issues such as fasting labs, EKGs and other studies, appropriate cancer screenings,  immunizations, etc.          Consider the Mediterranean Diet or the DASH Diet:     The DASH diet is an eating plan that can help lower your blood pressure. DASH stands for Dietary Approaches to Stop Hypertension. Hypertension is high blood pressure.   The DASH diet focuses on eating foods that are high in calcium, potassium, and magnesium. These nutrients can lower blood pressure. The foods that are highest in these nutrients are fruits, vegetables, low-fat dairy products, nuts, seeds, and legumes. But taking calcium, potassium, and magnesium supplements instead of eating foods that are high in those nutrients does not have the same effect. The DASH diet also includes  whole grains, fish, and poultry.   The DASH diet is one of several lifestyle changes your doctor may recommend to lower your high blood pressure. Your doctor may also want you to decrease the amount of sodium in your diet. Lowering sodium while following the DASH diet can lower blood pressure even further than just the DASH diet alone.     Following the DASH diet   Eat 4 to 5 servings of fruit each day. A serving is 1 medium-sized piece of fruit, ?? cup chopped or canned fruit, 1/4 cup dried fruit, or 4 ounces (?? cup) of fruit juice. Choose fruit more often than fruit juice.   Eat 4 to 5 servings of vegetables each day. A serving is 1 cup of lettuce or raw leafy vegetables, ?? cup of chopped or cooked vegetables, or 4 ounces (?? cup) of vegetable juice. Choose vegetables more often than vegetable juice.   Get 2 to 3 servings of low-fat and fat-free dairy each day. A serving is 8 ounces of milk, 1 cup of yogurt, or 1 ?? ounces of cheese.   Eat 7 to 8 servings of grains each day. A serving is 1 slice of bread, 1 ounce of dry cereal, or ?? cup of cooked rice, pasta, or cooked cereal. Try to choose whole-grain products as much as possible.   Limit lean meat, poultry,  and fish to 6 ounces each day. Six ounces is about the size of two decks of cards.   Eat 4 to 5 servings of nuts, seeds, and legumes (cooked dried beans, lentils, and split peas) each week. A serving is 1/3 cup of nuts, 2 tablespoons of seeds, or ?? cup cooked dried beans or peas.   Limit sweets and added sugars to 5 servings or less a week. A serving is 1 tablespoon jelly or jam, ?? cup sorbet, or 1 cup of lemonade.   Tips for success   Start small. Do not try to make dramatic changes to your diet all at once. You might feel that you are missing out on your favorite foods and then be more likely to not follow the plan. Make small changes, and stick with them. Once those changes become habit, add a few more changes.   Try some of the following:   Make it a goal to  eat a fruit or vegetable at every meal and at snacks. This will make it easy to get the recommended amount of fruits and vegetables each day.   Try yogurt topped with fruit and nuts for a snack or healthy dessert.   Add lettuce, tomato, cucumber, and onion to sandwiches.   Combine a ready-made pizza crust with low-fat mozzarella cheese and lots of vegetable toppings. Try using tomatoes, squash, spinach, broccoli, carrots, cauliflower, and onions.   Have a variety of cut-up vegetables with a low-fat dip as an appetizer instead of chips and dip.   Sprinkle sunflower seeds or chopped almonds over salads. Or try adding chopped walnuts or almonds to cooked vegetables.   Try some vegetarian meals using beans and peas. Add garbanzo or kidney beans to salads. Make burritos and tacos with mashed pinto beans or black beans.       I suggest a yearly flu shot    Please call Garlan Fair to help arrange and authorize any tests or referrals.  Her # is [phone]

## 2013-04-27 NOTE — Addendum Note (Signed)
Addended by: Maury DusMCCURRY, KIMBERLY A on: 04/27/2013 10:38 AM     Modules accepted: Orders, SmartSet

## 2013-04-27 NOTE — Progress Notes (Signed)
Chief Complaint   Patient presents with   ??? Annual Wellness Visit     Reviewed record in preparation for visit and have obtained necessary documentation.    1. Have you been to the ER, urgent care clinic since your last visit?  Hospitalized since your last visit?No    2. Have you seen or consulted any other health care providers outside of the [facility] since your last visit?  Include any pap smears or colon screening. No

## 2013-04-27 NOTE — Progress Notes (Signed)
Dylan Haney is a 80 y.o.  male  Issues discussed today include:    Follow up of chronic conditions, see diagnoses    Patient wants to go back to driving.  No ETOH since 8.2014.  Will arrange driving evaluation #[phone]       WELLNESS       PSA:  2.2. 2014    Colonoscopy:  never  FOBT:  declined  TDAP:  2011  Pneumovax:  2013  PCV-13:  2015  Flu shot:  2014  Zostavax:  eclined 2015  Eye exam:  2014  EKG:  2014    FTF for DME:  done:  [facility] but he no longer uses it  Advanced directive:  Full code  Specialists:      Data reviewed or ordered today:  Driving eval  Cards Welton FlakesKhan    Other problems include:  Patient Active Problem List   Diagnosis Code   ??? Falls E888.9   ??? ETOH abuse 305.00   ??? Atrial fibrillation 427.31   ??? Elevated bilirubin 782.4   ??? Unspecified vitamin D deficiency 268.9   ??? Macrocytosis 289.89   ??? NASH (nonalcoholic steatohepatitis) 571.8   ??? Cardiomyopathy 425.4   ??? Health care maintenance V70.0   ??? Advance directive discussed with patient V65.49       Medications:  Current Outpatient Prescriptions   Medication Sig Dispense Refill   ??? VITAMIN B-1 100 mg tablet TAKE ONE TABLET BY MOUTH DAILY  30 Tab  0   ??? fluoxetine (PROZAC) 10 mg capsule Take 1 capsule by mouth daily.  30 capsule  3   ??? folic acid 400 mcg tablet Take 1 tablet by mouth daily.  30 tablet  11   ??? cholecalciferol (VITAMIN D3) 1,000 unit tablet Take 1 tablet by mouth daily.  30 tablet  11   ??? aspirin 81 mg chewable tablet Take 1 Tab by mouth daily.  90 Tab  3       Allergies:  No Known Allergies    LMP:  No LMP for male patient.    History     Social History   ??? Marital Status: WIDOWED     Spouse Name: N/A     Number of Children: N/A   ??? Years of Education: N/A     Occupational History   ??? Not on file.     Social History Main Topics   ??? Smoking status: Former Smoker   ??? Smokeless tobacco: Never Used   ??? Alcohol Use: No      Comment: Patient stated he quit Aug 2013,,,,,,about pint of vodka per day grandson,    ??? Drug Use: No   ??? Sexual  Activity: Not on file     Other Topics Concern   ??? Not on file     Social History Narrative         No family history on file.  He lives with his granddaughter    Meaningful use:  Done    Review of Systems    Patient denies problems with    Headaches,  Chest pain, Shortness of breath, fevers, Hearing/vision, speaking/swallowing, Reflux/indigestion, Cough,Diarrhea/constipation,Problems passing or controlling urine,  Mood (anxiety/depression), Snoring/sleep,Fatigue, Weight change/Appetite  Any Positive ROS include: deaf left ear        Falls in the past 12 months:  no           Exercise:  active             Smoking history:  former                      NO Etoh      No LMP for male patient.    Physical Exam  BP 116/76    Pulse 71    Temp(Src) 97.8 ??F (36.6 ??C) (Oral)    Resp 14    Ht 5\' 8"  (1.727 m)    Wt 149 lb (67.586 kg)    BMI 22.66 kg/m2      SpO2 99%   BP Readings from Last 3 Encounters:   04/27/13 116/76   02/02/13 117/57   01/13/13 132/70       Constitutional:  Appears well,  No Acute Distress, Vitals noted  Psychiatric:   Affect normal, Alert and Oriented to person/place/time    Eyes:   Pupils equally round and reactive, EOMI, conjunctiva clear, eyelids normal  ENT:   External ears and nose normal/lips, teeth=OK/gums normal, TMs and Oropharynx normal  Neck:   general inspection and Thyroid normal.  No abnormal cervical or supraclavicular nodes    Lungs:   clear to auscultation, good respiratory effort  Heart:   Auscultation normal.  Regular rhythm.  No cardiac murmurs.  No carotid bruits or palpable thrills  Chest wall normal    Abdominal exam:   normal.  Liver and spleen normal.  No bruits/masses/tenderness    Extremities:   without edema, good peripheral pulses  Skin:   Warm to palpation, without rashes, bruising, or suspicious lesions       Neuro:  No facial droop, speech fluent, EOMI, Pupils equally round and reactive to light, visual fields seem  OK, hearing seems normal and symmetrical,smile symmetrical, puffs out cheeks symmetrically    Shoulder shrug symmetrical     moves all extremities, strength/sensationseem intact and symmetrical    Rapid alternating movements of hands normal and symmetrical    balance seems OK, no pronator drift, gait normal. "get up and go" test OK    squats OK, heel standing/toe standing OK    no tenderness of C spine, T spine, LS spine, flexion/extension of spine OK    Affect seems appropriate, no obvious mental processing problems    MSK:  Full ROM all joints    If he passes driving eval I am ok with limited driving          Assessment:    Patient Active Problem List   Diagnosis Code   ??? Falls E888.9   ??? ETOH abuse 305.00   ??? Atrial fibrillation 427.31   ??? Elevated bilirubin 782.4   ??? Unspecified vitamin D deficiency 268.9   ??? Macrocytosis 289.89   ??? NASH (nonalcoholic steatohepatitis) 571.8   ??? Cardiomyopathy 425.4   ??? Health care maintenance V70.0   ??? Advance directive discussed with patient V65.49       Today's diagnoses are:    ICD-9-CM   1. Routine general medical examination at a health care facility V70.0   2. Driving safety issue Z61.09    driving eval #[phone]       Plan:  No orders of the defined types were placed in this encounter.       See  patient instructions  Patient Instructions   arrange driving evaluation #[phone]     If you pass the driving evaluation, I am OK with limited daytime driving        In general, I advise patients to be as active as possible.  I believe exercise is the key to long life and good health.  The current recommendation is for individuals to exercise for 150 minutes each week (in other words 30 minutes 5 days a week).  Exercise should be vigorous enough to work up a sweat.  These activities include brisk walking, running, tennis, swimming, weight-lifting, etc.     I usually tell folks that work is work and exercise is exercise.  Each of these activities has a different goal.  Even though  you may be active at work, it may not be aerobically adequate.  So build dedicated exercise time into your weekly routine.    If a patient drinks alcohol, I suggest that a male drink only 2 beers (or glasses of wine, or shots of liquor) in any 24 hour period ( and not daily).  For females, the limits are one drink per 24 hours (and not daily).  After these limits, the toxic effects of alcohol consumption start to manifest.     Avoid tobacco products.    I suggest a wellness exam yearly during your birth month to update health maintenance issues such as fasting labs, EKGs and other studies, appropriate cancer screenings,  immunizations, etc.          Consider the Mediterranean Diet or the DASH Diet:     The DASH diet is an eating plan that can help lower your blood pressure. DASH stands for Dietary Approaches to Stop Hypertension. Hypertension is high blood pressure.   The DASH diet focuses on eating foods that are high in calcium, potassium, and magnesium. These nutrients can lower blood pressure. The foods that are highest in these nutrients are fruits, vegetables, low-fat dairy products, nuts, seeds, and legumes. But taking calcium, potassium, and magnesium supplements instead of eating foods that are high in those nutrients does not have the same effect. The DASH diet also includes whole grains, fish, and poultry.   The DASH diet is one of several lifestyle changes your doctor may recommend to lower your high blood pressure. Your doctor may also want you to decrease the amount of sodium in your diet. Lowering sodium while following the DASH diet can lower blood pressure even further than just the DASH diet alone.     Following the DASH diet   Eat 4 to 5 servings of fruit each day. A serving is 1 medium-sized piece of fruit, ?? cup chopped or canned fruit, 1/4 cup dried fruit, or 4 ounces (?? cup) of fruit juice. Choose fruit more often than fruit juice.   Eat 4 to 5 servings of vegetables each day. A serving is 1  cup of lettuce or raw leafy vegetables, ?? cup of chopped or cooked vegetables, or 4 ounces (?? cup) of vegetable juice. Choose vegetables more often than vegetable juice.   Get 2 to 3 servings of low-fat and fat-free dairy each day. A serving is 8 ounces of milk, 1 cup of yogurt, or 1 ?? ounces of cheese.   Eat 7 to 8 servings of grains each day. A serving is 1 slice of bread, 1 ounce of dry cereal, or ?? cup of cooked rice, pasta, or cooked cereal. Try to choose whole-grain products as much  as possible.   Limit lean meat, poultry, and fish to 6 ounces each day. Six ounces is about the size of two decks of cards.   Eat 4 to 5 servings of nuts, seeds, and legumes (cooked dried beans, lentils, and split peas) each week. A serving is 1/3 cup of nuts, 2 tablespoons of seeds, or ?? cup cooked dried beans or peas.   Limit sweets and added sugars to 5 servings or less a week. A serving is 1 tablespoon jelly or jam, ?? cup sorbet, or 1 cup of lemonade.   Tips for success   Start small. Do not try to make dramatic changes to your diet all at once. You might feel that you are missing out on your favorite foods and then be more likely to not follow the plan. Make small changes, and stick with them. Once those changes become habit, add a few more changes.   Try some of the following:   Make it a goal to eat a fruit or vegetable at every meal and at snacks. This will make it easy to get the recommended amount of fruits and vegetables each day.   Try yogurt topped with fruit and nuts for a snack or healthy dessert.   Add lettuce, tomato, cucumber, and onion to sandwiches.   Combine a ready-made pizza crust with low-fat mozzarella cheese and lots of vegetable toppings. Try using tomatoes, squash, spinach, broccoli, carrots, cauliflower, and onions.   Have a variety of cut-up vegetables with a low-fat dip as an appetizer instead of chips and dip.   Sprinkle sunflower seeds or chopped almonds over salads. Or try adding chopped walnuts or  almonds to cooked vegetables.   Try some vegetarian meals using beans and peas. Add garbanzo or kidney beans to salads. Make burritos and tacos with mashed pinto beans or black beans.       I suggest a yearly flu shot    Please call Garlan Fair to help arrange and authorize any tests or referrals.  Her # is [phone]               refresh note:  done    AVS Printed:  done

## 2013-04-29 LAB — CULTURE, URINE

## 2013-05-05 NOTE — Telephone Encounter (Signed)
Dorothy PufferBernita called for the patient saying he has rec'd letter to contact referrals.  She is now leaving but patient will be home.   York SpanielSaid he was referred out for evaluation for vehicle driving.

## 2013-05-06 NOTE — Telephone Encounter (Signed)
Spoke with patient today & told him that Dr Jean RosenthalJackson will need to write script to [facility] for him to have a driving evaluation done.... Script faxed to [phone]) Attn: Corrie DandyMary.... She will contact patient for an appointment date.Dylan Haney.Dylan Haney..Dylan Haney

## 2013-05-21 MED ORDER — VITAMIN B-1 100 MG TABLET
100 mg | ORAL_TABLET | ORAL | Status: DC
Start: 2013-05-21 — End: 2013-06-20

## 2013-06-22 MED ORDER — VITAMIN B-1 100 MG TABLET
100 mg | ORAL_TABLET | ORAL | Status: DC
Start: 2013-06-22 — End: 2013-07-17

## 2013-07-18 MED ORDER — VITAMIN B-1 100 MG TABLET
100 mg | ORAL_TABLET | ORAL | Status: DC
Start: 2013-07-18 — End: 2013-08-12

## 2013-08-12 MED ORDER — VITAMIN B-1 100 MG TABLET
100 mg | ORAL_TABLET | ORAL | Status: DC
Start: 2013-08-12 — End: 2016-10-20

## 2014-01-15 ENCOUNTER — Encounter: Attending: Specialist | Primary: Family Medicine

## 2014-01-19 ENCOUNTER — Encounter: Attending: Specialist | Primary: Family Medicine

## 2014-01-26 NOTE — Telephone Encounter (Signed)
Pt now has an appt on 03/08/14 for an annual appt but would like a call back to see if he could possibly be fit in sooner. Has some concerns that he would like to discuss with Dr Elmon KirschnerXenakis. Can be reached @ [phone]. Thanks

## 2014-01-26 NOTE — Telephone Encounter (Signed)
L/m for pt rtc

## 2014-03-08 ENCOUNTER — Encounter: Attending: Specialist | Primary: Family Medicine

## 2014-03-19 ENCOUNTER — Encounter: Attending: Specialist | Primary: Family Medicine

## 2014-03-30 ENCOUNTER — Ambulatory Visit: Admit: 2014-03-30 | Discharge: 2014-03-30 | Payer: MEDICARE | Attending: Specialist | Primary: Family Medicine

## 2014-03-30 DIAGNOSIS — I482 Chronic atrial fibrillation, unspecified: Secondary | ICD-10-CM

## 2014-03-30 NOTE — Progress Notes (Signed)
Subjective:     Problem List  Date Reviewed: 13-Apr-2014          Codes Class Noted    Healthcare maintenance ICD-10-CM: Z00.00  ICD-9-CM: V70.0  04/27/2013    Overview Signed 04/27/2013 10:02 AM by Jennye Boroughs, MD     PSA:  2.2. 2014    Colonoscopy:  never  FOBT:  declined  TDAP:  2011  Pneumovax:  2013  PCV-13:  2015  Flu shot:  2014  Zostavax:  eclined 2015  Eye exam:  2014  EKG:  2014    FTF for DME:  done:  [facility] but he no longer uses it  Advanced directive:  Full code  Specialists:      Data reviewed or ordered today:  Driving eval  Cards Welton Flakes  Driving evaluation [id number]             Advance directive discussed with patient ICD-10-CM: Z71.89  ICD-9-CM: V65.49  02/02/2013    Overview Signed 02/02/2013  9:54 AM by Jennye Boroughs, MD     Full code as of 2014             Health care maintenance ICD-10-CM: Z00.00  ICD-9-CM: V70.0  12/01/2012    Overview Signed 12/01/2012  5:14 PM by Jennye Boroughs, MD     Wellness:  Health Maintenance    Male:  PSA:  2.2 2014    Colonoscopy:  Never had and not interested  FOBT:  Not indicated  Glaucoma screen:  2014  Tdap:  2011  Pneumovax:  2013  Flu shot:  2014  Zostavax:  done  EKG documentation: on file  Advanced Directives:  discussed fll code  Face to face meeting to document need for DME, home health, therapy:  done: none, needs PT for strength  Updated specialist:  done, see problem list    Update PMH:  done    Review med list:  done             Cardiomyopathy [facility]) ICD-10-CM: I42.9  ICD-9-CM: 425.4  09/11/2012    Overview Addendum 12/01/2012  5:07 PM by Jennye Boroughs, MD     Dr. Elmon Kirschner  A.  Echo (09/11/12):  EF 35-40% with mod GHK.  Mildly dil RV.  Mod dil RA/LA.  Mild MR.  Mod-severe TR.  Mod PR.  PASP 60.  B.  Lexiscan Cardiolite (09/12/12):  Normal perfusion, EF 43% with mod inf HK and septal AK vs PSM.             NASH (nonalcoholic steatohepatitis) ICD-10-CM: K75.81  ICD-9-CM: 571.8  12/19/2011    Overview Signed 12/19/2011  4:30 PM by Jennye Boroughs, MD     [place] 2013              Macrocytosis ICD-10-CM: D75.89  ICD-9-CM: 289.89  12/11/2011    Overview Signed 12/11/2011  2:23 PM by Jennye Boroughs, MD     B12 and folate normal  Uses ETOH             Elevated bilirubin ICD-10-CM: R17  ICD-9-CM: 277.4  11/28/2011    Overview Addendum 12/01/2012  4:53 PM by Jennye Boroughs, MD     He could have Gilbert's syndrome although he uses ETOH daily               Unspecified vitamin D deficiency ICD-10-CM: E55.9  ICD-9-CM: 268.9  11/28/2011    Overview Signed 11/28/2011  2:54 PM by Jennye Boroughs,  MD     Needs Rx             Falls ICD-10-CM: [id number].Lorne SkeensXXXA  ICD-9-CM: E888.9  11/27/2011    Overview Signed 11/27/2011  1:41 PM by Jennye BoroughsPaul Jackson V, MD     Head CT:  No Intracranial Disease Evident on Head CT.               ETOH abuse ICD-10-CM: F10.10  ICD-9-CM: 305.00  11/27/2011        Atrial fibrillation [facility](HCC) ICD-10-CM: I48.91  ICD-9-CM: 427.31  11/27/2011              Dylan Haney is an 81 y.o. man with the above past medical history, who presents for follow up of his cardiomyopathy and atrial fibrillation.  He is doing reasonably well, but he has noticed increased dyspnea on exertion over the past six months or so.  He denies any chest pain or chest discomfort, shortness of breath at rest, orthopnea, paroxysmal nocturnal dyspnea, lower extremity swelling, palpitations, syncope or near syncope.  He is not taking any anticoagulation for atrial fibrillation due to frequent falls, however, he has cut back markedly on his drinking and he and his report that falls are not a problem any longer for him.  So we need to start him on anticoagulation.  He also tells me that he may have a hernia repair in the near future, so I think we should postpone initiation of anticoagulation until after this surgery.          History   Smoking status   ??? Former Smoker   Smokeless tobacco   ??? Never Used       Current Outpatient Prescriptions   Medication Sig Dispense Refill    ??? VITAMIN B-1 100 mg tablet TAKE ONE TABLET BY MOUTH EVERY 30 Tab 0   ??? fluoxetine (PROZAC) 10 mg capsule Take 1 capsule by mouth daily. 30 capsule 3   ??? folic acid 400 mcg tablet Take 1 tablet by mouth daily. 30 tablet 11   ??? cholecalciferol (VITAMIN D3) 1,000 unit tablet Take 1 tablet by mouth daily. 30 tablet 11   ??? aspirin 81 mg chewable tablet Take 1 Tab by mouth daily. 90 Tab 3       Objective:   BP 102/60 mmHg   Pulse 101   Resp 18   Ht 5\' 8"  (1.727 m)   Wt 160 lb (72.576 kg)   BMI 24.33 kg/m2    HEENT Exam:     Normocephalic, atraumatic. EOMI. Oropharynx negative. Neck supple. No lymphadenopathy.          Lung Exam:     The patient is not dyspneic. There is no cough. The lungs are clear to percussion. Breath sounds are heard equally in all lung fields. There are no wheezes, rales, rhonchi, or rubs heard on auscultation.          Heart Exam:     The rhythm is irregularly irregular. The PMI is in the 5th intercostal space of the MCL. Apical impulse is normal. S1 is regular. S2 is physiologic. There is no S3, S4 gallop, murmur, click, or rub.          Abdomen Exam:     Bowel sounds are normoactive. Abdomen benign.         Extremities Exam:     The extremities are atraumatic appearing. There is no clubbing, cyanosis, ulcers, varicose veins, rash, erythemia noted in the extremities. The neurovascular status  is grossly intact with normal distal sensation and pulses. Minimal ankle edema.         Vascular Exam:     The radial, brachial, dorsalis pedis, posterior tibial, are equal and strong bilaterally The carotids are equal bilaterally without bruits.              EKG: AF @ 101, RBBB, LAFB, PVC.     Assessment/Plan:     Mr. Bramer overall is stable.  His increasing dyspnea on exertion is somewhat concerning and may be due to deconditioning,but I would like to reassess his left ventricular systolic function, as well as evaluate him for ischemia via a  Lexiwalk Cardiolite Nuclear Stress Test.  If this pans  out to be within acceptable limits, and there is no significant change from his previous studies, then no further cardiac testing would be warranted at this time.  This will also help risk stratify him for upcoming surgery.  I am going to follow up with him in approximately two months time to re-discuss the issue of anticoagulation.    Plan:  1. Continue outpatient medication regimen.  Lexiwalk Cardiolite Nuclear Stress Test.  Follow up with me in two months time to discuss the use of anticoagulation.  Diet and exercise.  Call my office, call his primary care physician, or return to the hospital should any concerning symptomatology arise.       Mr. Mcshan indicated that he understood this plan and wished to proceed ahead.           Patient Care Team:  Jennye Boroughs, MD as PCP - General (Family Practice)  Shelda Altes, MD as Physician (Cardiology)  Pauline Good, RN as Nurse Navigator

## 2014-03-30 NOTE — Patient Instructions (Signed)
MyChart Activation    Thank you for requesting access to MyChart. Please follow the instructions below to securely access and download your online medical record. MyChart allows you to send messages to your doctor, view your test results, renew your prescriptions, schedule appointments, and more.    How Do I Sign Up?    1. In your internet browser, go to [email or web address]  2. Click on the First Time User? Click Here link in the Sign In box. You will be redirect to the New Member Sign Up page.  3. Enter your MyChart Access Code exactly as it appears below. You will not need to use this code after you???ve completed the sign-up process. If you do not sign up before the expiration date, you must request a new code.    MyChart Access Code: [email or web address]  Expires: 06/28/2014  1:36 PM (This is the date your MyChart access code will expire)    4. Enter the last four digits of your Social Security Number (xxxx) and Date of Birth (mm/dd/yyyy) as indicated and click Submit. You will be taken to the next sign-up page.  5. Create a MyChart ID. This will be your MyChart login ID and cannot be changed, so think of one that is secure and easy to remember.  6. Create a MyChart password. You can change your password at any time.  7. Enter your Password Reset Question and Answer. This can be used at a later time if you forget your password.   8. Enter your e-mail address. You will receive e-mail notification when new information is available in MyChart.  9. Click Sign Up. You can now view and download portions of your medical record.  10. Click the Download Summary menu link to download a portable copy of your medical information.    Additional Information    If you have questions, please visit the Frequently Asked Questions section of the MyChart website at [email or web address] Remember, MyChart is NOT to be used for urgent needs. For medical emergencies, dial 911.         Atrial Fibrillation: Care Instructions  Your Care Instructions     Atrial fibrillation is an irregular and often fast heartbeat. Treating this condition is important for several reasons. It can cause blood clots, which can travel from your heart to your brain and cause a stroke. If you have a fast heartbeat, you may feel lightheaded, dizzy, and weak. An irregular heartbeat can also increase your risk for heart failure.  Atrial fibrillation is often the result of another heart condition, such as high blood pressure or coronary artery disease. Making changes to improve your heart condition will help you stay healthy and active.  Follow-up care is a key part of your treatment and safety. Be sure to make and go to all appointments, and call your doctor if you are having problems. It's also a good idea to know your test results and keep a list of the medicines you take.  How can you care for yourself at home?  Medicines  ?? Take your medicines exactly as prescribed. Call your doctor if you think you are having a problem with your medicine. You will get more details on the specific medicines your doctor prescribes.  ?? If your doctor has given you a blood thinner to prevent a stroke, be sure you get instructions about how to take your medicine safely. Blood thinners can cause serious bleeding problems.  ?? Do not take any vitamins, over-the-counter drugs, or herbal products  without talking to your doctor first.  Lifestyle changes  ?? Do not smoke. Smoking can increase your chance of a stroke and heart attack. If you need help quitting, talk to your doctor about stop-smoking programs and medicines. These can increase your chances of quitting for good.  ?? Eat a heart-healthy diet.  ?? Stay at a healthy weight. Lose weight if you need to.  ?? Limit alcohol to 2 drinks a day for men and 1 drink a day for women. Too much alcohol can cause health problems.  ?? Avoid colds and flu. Get a pneumococcal vaccine shot. If you have had  one before, ask your doctor whether you need another dose. Get a flu shot every year. If you must be around people with colds or flu, wash your hands often.  Activity  ?? If your doctor recommends it, get more exercise. Walking is a good choice. Bit by bit, increase the amount you walk every day. Try for at least 30 minutes on most days of the week. You also may want to swim, bike, or do other activities. Your doctor may suggest that you join a cardiac rehabilitation program so that you can have help increasing your physical activity safely.  ?? Start light exercise if your doctor says it is okay. Even a small amount will help you get stronger, have more energy, and manage stress. Walking is an easy way to get exercise. Start out by walking a little more than you did in the hospital. Gradually increase the amount you walk.  ?? When you exercise, watch for signs that your heart is working too hard. You are pushing too hard if you cannot talk while you are exercising. If you become short of breath or dizzy or have chest pain, sit down and rest immediately.  ?? Check your pulse regularly. Place two fingers on the artery at the palm side of your wrist, in line with your thumb. If your heartbeat seems uneven or fast, talk to your doctor.  When should you call for help?  Call 911 anytime you think you may need emergency care. For example, call if:  ?? You have symptoms of a heart attack. These may include:  ?? Chest pain or pressure, or a strange feeling in the chest.  ?? Sweating.  ?? Shortness of breath.  ?? Nausea or vomiting.  ?? Pain, pressure, or a strange feeling in the back, neck, jaw, or upper belly or in one or both shoulders or arms.  ?? Lightheadedness or sudden weakness.  ?? A fast or irregular heartbeat.  After you call 911, the operator may tell you to chew 1 adult-strength or 2 to 4 low-dose aspirin. Wait for an ambulance. Do not try to drive yourself.  ?? You have symptoms of a stroke. These may include:   ?? Sudden numbness, tingling, weakness, or loss of movement in your face, arm, or leg, especially on only one side of your body.  ?? Sudden vision changes.  ?? Sudden trouble speaking.  ?? Sudden confusion or trouble understanding simple statements.  ?? Sudden problems with walking or balance.  ?? A sudden, severe headache that is different from past headaches.  ?? You passed out (lost consciousness).  Call your doctor now or seek immediate medical care if:  ?? You have new or increased shortness of breath.  ?? You feel dizzy or lightheaded, or you feel like you may faint.  ?? Your heart rate becomes irregular.  ?? You can feel  your heart flutter in your chest or skip heartbeats. Tell your doctor if these symptoms are new or worse.  Watch closely for changes in your health, and be sure to contact your doctor if you have any problems.   Where can you learn more?   Go to [email or web address]cy  Enter U020 in the search box to learn more about "Atrial Fibrillation: Care Instructions."   ?? 2006-2015 [employer]. Care instructions adapted under license by [employer] (which disclaims liability or warranty for this information). This care instruction is for use with your licensed healthcare professional. If you have questions about a medical condition or this instruction, always ask your healthcare professional. [place] any warranty or liability for your use of this information.  Content Version: 10.7.482551; Current as of: September 17, 2013

## 2014-04-15 ENCOUNTER — Institutional Professional Consult (permissible substitution): Admit: 2014-04-15 | Discharge: 2014-04-15 | Payer: MEDICARE | Primary: Family Medicine

## 2014-04-15 DIAGNOSIS — R0609 Other forms of dyspnea: Secondary | ICD-10-CM

## 2014-04-15 MED ORDER — REGADENOSON 0.4 MG/5 ML IV SYRINGE
0.4 mg/5 mL | Freq: Once | INTRAVENOUS | Status: AC
Start: 2014-04-15 — End: 2014-04-15

## 2014-04-15 NOTE — Progress Notes (Signed)
See scanned report. Dr. Xenakis ordered study and Dr. Kapadia read study. ID verified per protocol.

## 2014-04-19 MED ORDER — NUCLEAR MEDICINE ISOTOPE
Freq: Once | Status: AC
Start: 2014-04-19 — End: 2014-04-19

## 2014-05-06 NOTE — Telephone Encounter (Signed)
Nuclear stress was normal.Pts daughter advised

## 2014-05-06 NOTE — Telephone Encounter (Signed)
Patient's daughter Gavin PoundDeborah calling for the results from patient's recent nuclear stress test. Requests a call back at [phone]. Thanks

## 2014-05-31 ENCOUNTER — Encounter: Attending: Specialist | Primary: Family Medicine

## 2014-06-01 ENCOUNTER — Ambulatory Visit: Admit: 2014-06-01 | Discharge: 2014-06-01 | Payer: MEDICARE | Attending: Specialist | Primary: Family Medicine

## 2014-06-01 DIAGNOSIS — I4891 Unspecified atrial fibrillation: Secondary | ICD-10-CM

## 2014-06-01 NOTE — Progress Notes (Signed)
Subjective:     Problem List  Date Reviewed: 06/01/2014          Codes Class Noted    Healthcare maintenance ICD-10-CM: Z00.00  ICD-9-CM: V70.0  04/27/2013    Overview Signed 04/27/2013 10:02 AM by Jennye BoroughsPaul Jackson V, MD     PSA:  2.2. 2014    Colonoscopy:  never  FOBT:  declined  TDAP:  2011  Pneumovax:  2013  PCV-13:  2015  Flu shot:  2014  Zostavax:  eclined 2015  Eye exam:  2014  EKG:  2014    FTF for DME:  done:  [facility] but he no longer uses it  Advanced directive:  Full code  Specialists:      Data reviewed or ordered today:  Driving eval  Cards Welton FlakesKhan  Driving evaluation [id number]             Advance directive discussed with patient ICD-10-CM: Z71.89  ICD-9-CM: V65.49  02/02/2013    Overview Signed 02/02/2013  9:54 AM by Jennye BoroughsPaul Jackson V, MD     Full code as of 2014             Health care maintenance ICD-10-CM: Z00.00  ICD-9-CM: V70.0  12/01/2012    Overview Signed 12/01/2012  5:14 PM by Jennye BoroughsPaul Jackson V, MD     Wellness:  Health Maintenance    Male:  PSA:  2.2 2014    Colonoscopy:  Never had and not interested  FOBT:  Not indicated  Glaucoma screen:  2014  Tdap:  2011  Pneumovax:  2013  Flu shot:  2014  Zostavax:  done  EKG documentation: on file  Advanced Directives:  discussed fll code  Face to face meeting to document need for DME, home health, therapy:  done: none, needs PT for strength  Updated specialist:  done, see problem list    Update PMH:  done    Review med list:  done             Cardiomyopathy [facility](HCC) ICD-10-CM: I42.9  ICD-9-CM: 425.4  09/11/2012    Overview Addendum 06/01/2014  2:08 PM by Shelda AltesMark Syrai Gladwin, MD     Dr. Elmon KirschnerXenakis  A.  Echo (09/11/12):  EF 35-40% with mod GHK.  Mildly dil RV.  Mod dil RA/LA.  Mild MR.  Mod-severe TR.  Mod PR.  PASP 60.  B.  Lexiscan Cardiolite (09/12/12):  Normal perfusion, EF 43% with mod inf HK and septal AK vs PSM.  Lynder Parents.  Lexiwalk Cardiolite (04/15/14):  Normal perfusion.  EF 39% with PSM and GHK.             NASH (nonalcoholic steatohepatitis) ICD-10-CM: K75.81   ICD-9-CM: 571.8  12/19/2011    Overview Signed 12/19/2011  4:30 PM by Jennye BoroughsPaul Jackson V, MD     [place] 2013             Macrocytosis ICD-10-CM: D75.89  ICD-9-CM: 289.89  12/11/2011    Overview Signed 12/11/2011  2:23 PM by Jennye BoroughsPaul Jackson V, MD     B12 and folate normal  Uses ETOH             Elevated bilirubin ICD-10-CM: R17  ICD-9-CM: 277.4  11/28/2011    Overview Addendum 12/01/2012  4:53 PM by Jennye BoroughsPaul Jackson V, MD     He could have Gilbert's syndrome although he uses ETOH daily               Unspecified vitamin D deficiency ICD-10-CM: E55.9  ICD-9-CM: 268.9  11/28/2011    Overview Signed 11/28/2011  2:54 PM by Jennye Boroughs, MD     Needs Rx             Falls ICD-10-CM: [id number].Lorne Skeens  ICD-9-CM: E888.9  11/27/2011    Overview Signed 11/27/2011  1:41 PM by Jennye Boroughs, MD     Head CT:  No Intracranial Disease Evident on Head CT.               ETOH abuse ICD-10-CM: F10.10  ICD-9-CM: 305.00  11/27/2011        Atrial fibrillation [facility]) ICD-10-CM: I48.91  ICD-9-CM: 427.31  11/27/2011              Dylan Haney is an 81 y.o. man with the above past medical history, who presents for follow up of his cardiomyopathy and atrial fibrillation.  He is doing reasonably well from a cardiac standpoint.  He denies any chest pain, chest discomfort, shortness of breath, orthopnea, paroxysmal nocturnal dyspnea, lower extremity swelling, palpitations, syncope or near syncope.  Though he denies any lower extremity swelling, he does have mild left lower extremity edema on exam.  He was to have inguinal hernia repair last time I saw him, but this has not yet happened.  He is going to contact the surgeon with regard to this.  We have not started him on anticoagulation.  In retrospect looking back on his fall history, history of alcoholism, recent breakup with his girlfriend, and it appears that he is having some memory issues, I am not overly enthused about starting him on anticoagulation at this time.  He has a number of bruises on his arms  all ready, and I worry about him falling and having a very high bleeding risk.           History   Smoking status   ??? Former Smoker   Smokeless tobacco   ??? Never Used       Current Outpatient Prescriptions   Medication Sig Dispense Refill   ??? VITAMIN B-1 100 mg tablet TAKE ONE TABLET BY MOUTH EVERY 30 Tab 0   ??? fluoxetine (PROZAC) 10 mg capsule Take 1 capsule by mouth daily. 30 capsule 3   ??? folic acid 400 mcg tablet Take 1 tablet by mouth daily. 30 tablet 11   ??? cholecalciferol (VITAMIN D3) 1,000 unit tablet Take 1 tablet by mouth daily. 30 tablet 11   ??? aspirin 81 mg chewable tablet Take 1 Tab by mouth daily. 90 Tab 3       Objective:   BP 132/84 mmHg   Pulse 64   Resp 20   Ht  (1.727 m)   Wt 156 lb (70.761 kg)   BMI 23.73 kg/m2   SpO2 92%    HEENT Exam:     Normocephalic, atraumatic. EOMI. Oropharynx negative. Neck supple. No lymphadenopathy.          Lung Exam:     The patient is not dyspneic. There is no cough. The lungs are clear to percussion. Breath sounds are heard equally in all lung fields. There are no wheezes, rales, rhonchi, or rubs heard on auscultation.          Heart Exam:     The rhythm is irregularly irregular. The PMI is in the 5th intercostal space of the MCL. Apical impulse is normal. S1 is regular. S2 is physiologic. There is no S3, S4 gallop, murmur, click, or rub.  Abdomen Exam:     Bowel sounds are normoactive. Abdomen benign.         Extremities Exam:     There is no clubbing, cyanosis, ulcers, varicose veins, rash, erythemia noted in the extremities. The neurovascular status is grossly intact with normal distal sensation and pulses. Mild LLE edema.  Bruises on forearms.         Vascular Exam:     The radial, brachial, dorsalis pedis, posterior tibial, are equal and strong bilaterally The carotids are equal bilaterally without bruits.              EKG: AF @ 99, PVC, RBBB, LAFB.     Assessment/Plan:     Dylan Haney is overall stable.  Once again, I had my reservations about  starting him on anticoagulation at this time due to the aforementioned reasons.  He is going to follow up with me in six months time.  I have recommended that he stay on hid Aspirin per day. Also we are going to obtain a left lower extremity venous ultrasound to assess for deep vein thrombosis given his unilateral swelling.  If he has a DVT then perhaps the benefits of anticoagulation will outweigh the risks.  He will stay the course with his other medications.     Plan:  1. Continue outpatient medication regimen.  2. Left lower extremity venous ultrasound.  3. Follow up with me in six months time or sooner pending the results of #2.   4. Diet and exercise.  5. Call my office, call his primary care physician, or return to the hospital should any concerning symptomatology arise.       Dylan Haney indicated that he understood this plan and wished to proceed ahead.          Patient Care Team:  Jennye Boroughs, MD as PCP - General (Family Practice)  Shelda Altes, MD as Physician (Cardiology)  Pauline Good, RN as Nurse Navigator

## 2014-06-01 NOTE — Patient Instructions (Signed)
MyChart Activation    Thank you for requesting access to MyChart. Please follow the instructions below to securely access and download your online medical record. MyChart allows you to send messages to your doctor, view your test results, renew your prescriptions, schedule appointments, and more.    How Do I Sign Up?    1. In your internet browser, go to [email or web address]  2. Click on the First Time User? Click Here link in the Sign In box. You will be redirect to the New Member Sign Up page.  3. Enter your MyChart Access Code exactly as it appears below. You will not need to use this code after you???ve completed the sign-up process. If you do not sign up before the expiration date, you must request a new code.    MyChart Access Code: [email or web address]  Expires: 06/28/2014  2:36 PM (This is the date your MyChart access code will expire)    4. Enter the last four digits of your Social Security Number (xxxx) and Date of Birth (mm/dd/yyyy) as indicated and click Submit. You will be taken to the next sign-up page.  5. Create a MyChart ID. This will be your MyChart login ID and cannot be changed, so think of one that is secure and easy to remember.  6. Create a MyChart password. You can change your password at any time.  7. Enter your Password Reset Question and Answer. This can be used at a later time if you forget your password.   8. Enter your e-mail address. You will receive e-mail notification when new information is available in MyChart.  9. Click Sign Up. You can now view and download portions of your medical record.  10. Click the Download Summary menu link to download a portable copy of your medical information.    Additional Information    If you have questions, please visit the Frequently Asked Questions section of the MyChart website at [email or web address] Remember, MyChart is NOT to be used for urgent needs. For medical emergencies, dial 911.         Atrial Fibrillation: Care Instructions  Your Care Instructions     Atrial fibrillation is an irregular and often fast heartbeat. Treating this condition is important for several reasons. It can cause blood clots, which can travel from your heart to your brain and cause a stroke. If you have a fast heartbeat, you may feel lightheaded, dizzy, and weak. An irregular heartbeat can also increase your risk for heart failure.  Atrial fibrillation is often the result of another heart condition, such as high blood pressure or coronary artery disease. Making changes to improve your heart condition will help you stay healthy and active.  Follow-up care is a key part of your treatment and safety. Be sure to make and go to all appointments, and call your doctor if you are having problems. It's also a good idea to know your test results and keep a list of the medicines you take.  How can you care for yourself at home?  Medicines  ?? Take your medicines exactly as prescribed. Call your doctor if you think you are having a problem with your medicine. You will get more details on the specific medicines your doctor prescribes.  ?? If your doctor has given you a blood thinner to prevent a stroke, be sure you get instructions about how to take your medicine safely. Blood thinners can cause serious bleeding problems.  ?? Do not take any vitamins, over-the-counter drugs, or herbal products  without talking to your doctor first.  Lifestyle changes  ?? Do not smoke. Smoking can increase your chance of a stroke and heart attack. If you need help quitting, talk to your doctor about stop-smoking programs and medicines. These can increase your chances of quitting for good.  ?? Eat a heart-healthy diet.  ?? Stay at a healthy weight. Lose weight if you need to.  ?? Limit alcohol to 2 drinks a day for men and 1 drink a day for women. Too much alcohol can cause health problems.  ?? Avoid colds and flu. Get a pneumococcal vaccine shot. If you have had  one before, ask your doctor whether you need another dose. Get a flu shot every year. If you must be around people with colds or flu, wash your hands often.  Activity  ?? If your doctor recommends it, get more exercise. Walking is a good choice. Bit by bit, increase the amount you walk every day. Try for at least 30 minutes on most days of the week. You also may want to swim, bike, or do other activities. Your doctor may suggest that you join a cardiac rehabilitation program so that you can have help increasing your physical activity safely.  ?? Start light exercise if your doctor says it is okay. Even a small amount will help you get stronger, have more energy, and manage stress. Walking is an easy way to get exercise. Start out by walking a little more than you did in the hospital. Gradually increase the amount you walk.  ?? When you exercise, watch for signs that your heart is working too hard. You are pushing too hard if you cannot talk while you are exercising. If you become short of breath or dizzy or have chest pain, sit down and rest immediately.  ?? Check your pulse regularly. Place two fingers on the artery at the palm side of your wrist, in line with your thumb. If your heartbeat seems uneven or fast, talk to your doctor.  When should you call for help?  Call 911 anytime you think you may need emergency care. For example, call if:  ?? You have symptoms of a heart attack. These may include:  ?? Chest pain or pressure, or a strange feeling in the chest.  ?? Sweating.  ?? Shortness of breath.  ?? Nausea or vomiting.  ?? Pain, pressure, or a strange feeling in the back, neck, jaw, or upper belly or in one or both shoulders or arms.  ?? Lightheadedness or sudden weakness.  ?? A fast or irregular heartbeat.  After you call 911, the operator may tell you to chew 1 adult-strength or 2 to 4 low-dose aspirin. Wait for an ambulance. Do not try to drive yourself.  ?? You have symptoms of a stroke. These may include:   ?? Sudden numbness, tingling, weakness, or loss of movement in your face, arm, or leg, especially on only one side of your body.  ?? Sudden vision changes.  ?? Sudden trouble speaking.  ?? Sudden confusion or trouble understanding simple statements.  ?? Sudden problems with walking or balance.  ?? A sudden, severe headache that is different from past headaches.  ?? You passed out (lost consciousness).  Call your doctor now or seek immediate medical care if:  ?? You have new or increased shortness of breath.  ?? You feel dizzy or lightheaded, or you feel like you may faint.  ?? Your heart rate becomes irregular.  ?? You can feel  your heart flutter in your chest or skip heartbeats. Tell your doctor if these symptoms are new or worse.  Watch closely for changes in your health, and be sure to contact your doctor if you have any problems.   Where can you learn more?   Go to [email or web address].cy  Enter U020 in the search box to learn more about "Atrial Fibrillation: Care Instructions."   ?? 2006-2016 Healthwise, Inc[employer]. Care instructions adapted under license by [employer] (which disclaims liability or warranty for this information). This care instruction is for use with your licensed healthcare professional. If you have questions about a medical condition or this instruction, always ask your healthcare professional. [place] any warranty or liability for your use of this information.  Content Version: 10.8.513193; Current as of: September 17, 2013

## 2014-06-04 ENCOUNTER — Institutional Professional Consult (permissible substitution): Admit: 2014-06-04 | Payer: MEDICARE | Primary: Family Medicine

## 2014-06-04 DIAGNOSIS — R600 Localized edema: Secondary | ICD-10-CM

## 2014-06-04 NOTE — Progress Notes (Signed)
See scanned vascular report

## 2014-06-10 ENCOUNTER — Ambulatory Visit: Admit: 2014-06-10 | Discharge: 2014-06-10 | Payer: MEDICARE | Attending: Family Medicine | Primary: Family Medicine

## 2014-06-10 DIAGNOSIS — K4090 Unilateral inguinal hernia, without obstruction or gangrene, not specified as recurrent: Secondary | ICD-10-CM

## 2014-06-10 NOTE — Progress Notes (Signed)
Chief Complaint   Patient presents with   ??? Other     needs a referral general surgeon     1. Have you been to the ER, urgent care clinic since your last visit?  Hospitalized since your last visit?No    2. Have you seen or consulted any other health care providers outside of the [facility] since your last visit?  Include any pap smears or colon screening. Yes, [facility]

## 2014-06-10 NOTE — Progress Notes (Signed)
[facility]  [address]  [place], [place] [address]  [id number]             Date of visit: 06/10/2014       This is a Subsequent Medicare Annual Wellness Visit (AWV), (Performed more than 12 months after effective date of Medicare Part B enrollment and 12 months after last preventive visit, Once in a lifetime)    I have reviewed the patient's medical history in detail and updated the computerized patient record.     Dylan Haney is a 81 y.o. male   History obtained from: the patient.    Concerns today   (Patient understands that medical problems addressed today may incur additional cost as this is a preventive visit)  -yes    History     Patient Active Problem List   Diagnosis Code   ??? Falls W19.Lorne Skeens   ??? ETOH abuse F10.10   ??? Atrial fibrillation (HCC) I48.91   ??? Elevated bilirubin R17   ??? Unspecified vitamin D deficiency E55.9   ??? Macrocytosis D75.89   ??? NASH (nonalcoholic steatohepatitis) K75.81   ??? Cardiomyopathy (HCC) I42.9   ??? Health care maintenance Z00.00   ??? Advance directive discussed with patient Z71.89   ??? Healthcare maintenance Z00.00     Past Medical History   Diagnosis Date   ??? Alcohol abuse    ??? Ill-defined condition      seasonal allergies., fatty liver, alcoholism   ??? Ill-defined condition      a-fib   ??? Cardiomyopathy (HCC) 09/11/2012     A.  Echo (09/11/12):  EF 35-40% with mod GHK.  Mildly dil RV.  Mod dil RA/LA.  Mild MR.  Mod-severe TR.  Mod PR.  PASP 60.      History reviewed. No pertinent past surgical history.  No Known Allergies  Current Outpatient Prescriptions   Medication Sig Dispense Refill   ??? VITAMIN B-1 100 mg tablet TAKE ONE TABLET BY MOUTH EVERY 30 Tab 0   ??? fluoxetine (PROZAC) 10 mg capsule Take 1 capsule by mouth daily. 30 capsule 3   ??? folic acid 400 mcg tablet Take 1 tablet by mouth daily. 30 tablet 11   ??? cholecalciferol (VITAMIN D3) 1,000 unit tablet Take 1 tablet by mouth daily. 30 tablet 11    ??? aspirin 81 mg chewable tablet Take 1 Tab by mouth daily. 90 Tab 3     Family History   Problem Relation Age of Onset   ??? No Known Problems Mother      History   Substance Use Topics   ??? Smoking status: Former Smoker   ??? Smokeless tobacco: Never Used   ??? Alcohol Use: 0.0 oz/week      Comment: Patient stated he quit Aug 2013,,,,,,about pint of vodka per day grandson,        Specialists/Care Team   [employer] [place] has established care with the following healthcare providers:  Patient Care Team:  Jennye Boroughs, MD as PCP - General (Family Practice)  Shelda Altes, MD as Physician (Cardiology)  Pauline Good, RN as Nurse Navigator      Health Risk Assessment     Demographics   male  81 y.o.    General Health Questions   -During the past 4 weeks:   -how would you rate your health in general? Fair   -how often have you been bothered by feeling dizzy when standing up? never   -  how much have you been bothered by bodily pain? not   -Have you noticed any hearing difficulties? 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   -has your physical and emotional health limited your social activities with family or friends? no    Emotional Health Questions   -Do you have a history of depression, anxiety, or emotional problems? no  -Over the past 2 weeks, have you felt down, depressed or hopeless? no  -Over the past 2 weeks, have you felt little interest or pleasure in doing things? no    Health Habits   Please describe your diet habits: Patient states that he tries to eat his fruits and vegetables.  Do you get 5 servings of fruits or vegetables daily? yes  Do you exercise regularly? Yes, patient states that he enjoys walking    Activities of Daily Living and Functional Status   -Do you need help with eating, walking, dressing, bathing, toileting, the phone, transportation, shopping, preparing meals, housework, laundry, medications or managing money? yes   -In the past four weeks, was someone available to help you if you needed and wanted help with anything? yes  -Are you confident are you that you can control and manage most of your health problems? yes  -Have you been given information to help you keep track of your medications? yes  -How often do you have trouble taking your medications as prescribed? never, patient uses a pill box.    Fall Risk and Home Safety   Have you fallen 2 or more times in the past year? Yes, patient states that he had a fall in the bathroom.  Does your home have rugs in the hallway, lack grab bars in the bathroom, lack handrails on the stairs or have poor lighting? no  Do you have smoke detectors and check them regularly? yes  Do you have difficulties driving a car? no  Do you always fasten your seat belt when you are in a car? yes    Review of Systems (if indicated for problems addressed today)   Patient has an office visit with Dr. Karie ChimeraAgbeibor today.    Physical Examination     Filed Vitals:    06/10/14 1421   BP: 132/80   Pulse: 95   Temp: 97.1 ??F (36.2 ??C)   TempSrc: Oral   Resp: 17   Height: 5\' 8"  (1.727 m)   Weight: 153 lb 6.4 oz (69.582 kg)   SpO2: 99%     Body mass index is 23.33 kg/(m^2).   No exam data present  Was the patient's timed Up & Go test unsteady or longer than 30 seconds? no    Evaluation of Cognitive Function   Mood/affect:  happy  Orientation: Person, Place and Time  Appearance: age appropriate and casually dressed  Family member/caregiver input: Daughter    Additional exam if indicated for problems addressed today:  N/A    Advice/Referrals/Counseling (as indicated)   Education and counseling provided for any problems identified above: Patient encouraged to eat a healthy diet and exercise daily.    Preventive Services     (Preventive care checklist to be included in patient instructions)  Discussed today Done Previously Not Needed     X  Pneumococcal vaccines      Flu vaccine      Hepatitis B vaccine (if at risk)     X  Shingles vaccine    X  TDAP vaccine      Digital  rectal exam      PSA    X  Colorectal cancer screening      Low-dose CT for lung cancer screening      Bone density test      Glaucoma screening      Cholesterol test      Diabetes screening test       Diabetes self-management class      Nutritionist referral for diabetes or renal disease     Discussion of Advance Directive   Discussed with Dylan Haney his ability to prepare and advance directive in the case that an injury or illness causes him to be unable to make health care decisions.   On File    Assessment/Plan   Z00.00    ICD-10-CM ICD-9-CM    1. Unilateral inguinal hernia without obstruction or gangrene, recurrence not specified K40.90 550.90 REFERRAL TO GENERAL SURGERY       Orders Placed This Encounter   ??? REFERRAL TO GENERAL SURGERY       Follow-up Disposition: Not on File    Pauline Goodracey C Ta Fair, RN

## 2014-06-11 NOTE — Progress Notes (Signed)
HISTORY OF PRESENT ILLNESS  Dylan Haney is a 81 y.o. male.  HPI  This 81 year old presents for evaluation of a bulge in his right groin and for consideration of referral to general surgery.  He is accompanied by his daughter who states the bulge appeared after he fell.  No other complaints.  He denies abdominal  pain, n or v.  Review of Systems   Constitutional: Negative for fever and chills.   Gastrointestinal: Negative for nausea, vomiting and abdominal pain.   Genitourinary: Negative for dysuria and urgency.   Musculoskeletal: Negative for myalgias.       Physical Exam   Constitutional: He appears well-developed and well-nourished. No distress.   Cardiovascular: Normal rate, regular rhythm and normal heart sounds.    No murmur heard.  Pulmonary/Chest: Effort normal and breath sounds normal.   Abdominal: Soft. Bowel sounds are normal.   Very large right inguinal hernia   Skin: He is not diaphoretic.       ASSESSMENT and PLAN    ICD-10-CM ICD-9-CM    1. Unilateral inguinal hernia without obstruction or gangrene, recurrence not specified K40.90 550.90 REFERRAL TO GENERAL SURGERY   referral to gen surgery.  If surgery is a consideration, pt will need prior clearance from cardiology.  Precautions given

## 2014-06-15 NOTE — Telephone Encounter (Signed)
Daughter called for update on this referral request.  Stated she was to have rec' d a call from the office.  Can leave message at number provider as she is at work.    [id number] - REFERRAL TO GENERAL SURGERY  Lerry LinerBryan A Johnson, MD 1 1   ??      ??   Diagnosis Information    ?? ?? Diagnosis   ?? K40.90 (ICD-10-CM) - Unilateral inguinal hernia without obstruction or gangrene, recurrence not specified   ??      ??   Referral Notes

## 2014-06-21 NOTE — Telephone Encounter (Signed)
Left message on answering machine, the name of the general surgeon & his number.... Also informed her to call me back with the date.Marland Kitchen.Marland Kitchen..Marland Kitchen

## 2014-06-23 NOTE — Telephone Encounter (Signed)
Patient daughter [name]( Deborah) is calling to inform of appt    appt is 07/08/14 at 2:45 pm with    [id number] - REFERRAL TO GENERAL SURGERY  Lerry LinerBryan A Johnson, MD 1 1        Ph [phone]  Fax [phone]   Last office note needed

## 2014-07-08 NOTE — Telephone Encounter (Signed)
Debbie with Dr Judie Grieve Johnson's office calling. States that the patient's need to have a hernia repair and will need cardiac clearance. Procedure has not yet been scheduled. Phone number [phone]. Fax number [phone]. Thanks!

## 2014-07-09 NOTE — Telephone Encounter (Signed)
Clearance faxed

## 2014-07-09 NOTE — Telephone Encounter (Signed)
Please call patients daugher, [name] at [phone].  They are in need of surgical clearance asap.  Thank you, Olegario Messier

## 2014-07-23 ENCOUNTER — Inpatient Hospital Stay: Admit: 2014-07-23 | Payer: MEDICARE | Primary: Family Medicine

## 2014-07-23 DIAGNOSIS — Z01818 Encounter for other preprocedural examination: Secondary | ICD-10-CM

## 2014-07-23 LAB — METABOLIC PANEL, COMPREHENSIVE
A-G Ratio: 1 — ABNORMAL LOW (ref 1.1–2.2)
ALT (SGPT): 32 U/L (ref 12–78)
AST (SGOT): 60 U/L — ABNORMAL HIGH (ref 15–37)
Albumin: 3.6 g/dL (ref 3.5–5.0)
Alk. phosphatase: 116 U/L (ref 45–117)
Anion gap: 7 mmol/L (ref 5–15)
BUN/Creatinine ratio: 20 (ref 12–20)
BUN: 18 MG/DL (ref 6–20)
Bilirubin, total: 1 MG/DL (ref 0.2–1.0)
CO2: 30 mmol/L (ref 21–32)
Calcium: 8.6 MG/DL (ref 8.5–10.1)
Chloride: 104 mmol/L (ref 97–108)
Creatinine: 0.9 MG/DL (ref 0.70–1.30)
GFR est AA: >60 mL/min/{1.73_m2} (ref >60)
GFR est non-AA: >60 mL/min/{1.73_m2} (ref >60)
Globulin: 3.6 g/dL (ref 2.0–4.0)
Glucose: 76 mg/dL (ref 65–100)
Potassium: 3.8 mmol/L (ref 3.5–5.1)
Protein, total: 7.2 g/dL (ref 6.4–8.2)
Sodium: 141 mmol/L (ref 136–145)

## 2014-07-23 LAB — CBC WITH AUTOMATED DIFF
ABS. BASOPHILS: 0 10*3/uL (ref 0.0–0.1)
ABS. EOSINOPHILS: 0.1 10*3/uL (ref 0.0–0.4)
ABS. LYMPHOCYTES: 1.5 10*3/uL (ref 0.8–3.5)
ABS. MONOCYTES: 0.5 10*3/uL (ref 0.0–1.0)
ABS. NEUTROPHILS: 2.9 10*3/uL (ref 1.8–8.0)
BASOPHILS: 0 % (ref 0–1)
EOSINOPHILS: 1 % (ref 0–7)
HCT: 38 % (ref 36.6–50.3)
HGB: 12.8 g/dL (ref 12.1–17.0)
LYMPHOCYTES: 31 % (ref 12–49)
MCH: 36.4 PG — ABNORMAL HIGH (ref 26.0–34.0)
MCHC: 33.7 g/dL (ref 30.0–36.5)
MCV: 108 FL — ABNORMAL HIGH (ref 80.0–99.0)
MONOCYTES: 10 % (ref 5–13)
NEUTROPHILS: 58 % (ref 32–75)
PLATELET: 151 10*3/uL (ref 150–400)
RBC: 3.52 M/uL — ABNORMAL LOW (ref 4.10–5.70)
RDW: 14.6 % — ABNORMAL HIGH (ref 11.5–14.5)
WBC: 4.9 10*3/uL (ref 4.1–11.1)

## 2014-07-23 LAB — PTT: aPTT: 29.9 s (ref 22.1–32.5)

## 2014-07-23 LAB — PROTHROMBIN TIME + INR
INR: 1.1 (ref 0.9–1.1)
Prothrombin time: 11.3 s — ABNORMAL HIGH (ref 9.0–11.1)

## 2014-07-23 NOTE — Other (Signed)
[facility]  PREOPERATIVE INSTRUCTIONS    Surgery Date:   07/30/2014  Surgery arrival time given by surgeon: NO   If ???no???,[facility] staff will call you between 4 PM- 8 PM the day before surgery with your arrival time. If your surgery is on a Monday, we will call you the preceding Friday.       Please call [phone] after 8 PM if you did not receive your arrival time.    1. Please report at the designated time to the 2nd Floor Admitting Desk.  Bring your insurance card, photo identification, and any copayment ( if applicable).  2. You must have a responsible adult to drive you home. You need to have a responsible adult to stay with you the first 24 hours after surgery if you are going home the same day of your surgery and you should not drive a car for 24 hours following your surgery.  3. Nothing to eat or drink after midnight the night before surgery. This includes no water, gum, mints, coffee, juice, etc.  Please note special instructions, if applicable, below for medications.  4. MEDICATIONS TO TAKE THE MORNING OF SURGERY WITH A SIP OF WATER: Prozac  5. No alcoholic beverages 24 hours before or after your surgery.  6. If you are being admitted to the hospital,please leave personal belongings/luggage in your car until you have an assigned hospital room number.  7. Stop Aspirin and/or any non-steroidal anti-inflammatory drugs (i.e. Ibuprofen, Naproxen, Advil, Aleve) as directed by your surgeon.  You may take Tylenol.        Stop herbal supplements 1 week prior to  surgery.  8. If you are currently taking Plavix, Coumadin,or any other blood-thinning/anticoagulant medication contact your surgeon for instructions.  9. Please wear comfortable clothes. Wear your glasses instead of contacts. We ask that all money, jewelry and valuables be left at home. Wear no make up, particularly mascara, the day of surgery.   10.  All body piercings, rings,and jewelry need to be removed and left at  home.    Please wear your hair loose or down. Please no pony-tails, buns, or any metal hair accessories. If you shower the morning of surgery, please do not apply any lotions, powders, or deodorants afterwards.   Do not shave any body area within 24 hours of your surgery.  11. Please follow all instructions to avoid any potential surgical cancellation.  12.  Should your physical condition change, (i.e. fever, cold, flu, etc.) please notify your surgeon as soon as possible.  13. It is important to be on time. If a situation occurs where you may be delayed, please call:  [phone] / [phone] on the day of surgery.  14. The Preadmission Testing staff can be reached at [phone]..  15. Special instructions: Free Valet Parking    The patient was contacted  in person.   He  verbalize  understanding of all instructions does not  need reinforcement.

## 2014-07-30 ENCOUNTER — Inpatient Hospital Stay
Admit: 2014-07-30 | Discharge: 2014-07-31 | Disposition: A | Discharge status: Home / Self Care | Payer: MEDICARE | Attending: Emergency Medicine

## 2014-07-30 DIAGNOSIS — K4090 Unilateral inguinal hernia, without obstruction or gangrene, not specified as recurrent: Secondary | ICD-10-CM

## 2014-07-30 LAB — CBC WITH AUTOMATED DIFF
ABS. BASOPHILS: 0 10*3/uL (ref 0.0–0.1)
ABS. EOSINOPHILS: 0 10*3/uL (ref 0.0–0.4)
ABS. LYMPHOCYTES: 0.9 10*3/uL (ref 0.8–3.5)
ABS. MONOCYTES: 0.6 10*3/uL (ref 0.0–1.0)
ABS. NEUTROPHILS: 6.9 10*3/uL (ref 1.8–8.0)
BASOPHILS: 0 % (ref 0–1)
EOSINOPHILS: 0 % (ref 0–7)
HCT: 30.7 % — ABNORMAL LOW (ref 36.6–50.3)
HGB: 10.3 g/dL — ABNORMAL LOW (ref 12.1–17.0)
LYMPHOCYTES: 11 % — ABNORMAL LOW (ref 12–49)
MCH: 36.5 PG — ABNORMAL HIGH (ref 26.0–34.0)
MCHC: 33.6 g/dL (ref 30.0–36.5)
MCV: 108.9 FL — ABNORMAL HIGH (ref 80.0–99.0)
MONOCYTES: 7 % (ref 5–13)
NEUTROPHILS: 82 % — ABNORMAL HIGH (ref 32–75)
PLATELET: 170 10*3/uL (ref 150–400)
RBC: 2.82 M/uL — ABNORMAL LOW (ref 4.10–5.70)
RDW: 14.6 % — ABNORMAL HIGH (ref 11.5–14.5)
WBC: 8.4 10*3/uL (ref 4.1–11.1)

## 2014-07-30 MED ORDER — PROPOFOL 10 MG/ML IV EMUL
10 mg/mL | INTRAVENOUS | Status: DC | PRN
Start: 2014-07-30 — End: 2014-07-30
  Administered 2014-07-30 (×4): via INTRAVENOUS

## 2014-07-30 MED ORDER — LIDOCAINE 4 % TOPICAL SOLN
4 % | LARYNGEAL | Status: AC
Start: 2014-07-30 — End: ?

## 2014-07-30 MED ORDER — SODIUM CHLORIDE 0.9 % IJ SYRG
INTRAMUSCULAR | Status: DC | PRN
Start: 2014-07-30 — End: 2014-07-30

## 2014-07-30 MED ORDER — HYDROMORPHONE (PF) 1 MG/ML IJ SOLN
1 mg/mL | INTRAMUSCULAR | Status: DC | PRN
Start: 2014-07-30 — End: 2014-07-30

## 2014-07-30 MED ORDER — BUPIVACAINE (PF) 0.5 % (5 MG/ML) IJ SOLN
0.5 % (5 mg/mL) | INTRAMUSCULAR | Status: AC
Start: 2014-07-30 — End: ?

## 2014-07-30 MED ORDER — PROPOFOL 10 MG/ML IV EMUL
10 mg/mL | INTRAVENOUS | Status: AC
Start: 2014-07-30 — End: ?

## 2014-07-30 MED ORDER — BUPIVACAINE (PF) 0.5 % (5 MG/ML) IJ SOLN
0.55 % (5 mg/mL) | INTRAMUSCULAR | Status: DC | PRN
Start: 2014-07-30 — End: 2014-07-30
  Administered 2014-07-30: 13:00:00 via SUBCUTANEOUS

## 2014-07-30 MED ORDER — SODIUM CHLORIDE 0.9 % IJ SYRG
Freq: Three times a day (TID) | INTRAMUSCULAR | Status: DC
Start: 2014-07-30 — End: 2014-07-30

## 2014-07-30 MED ORDER — MIDAZOLAM 1 MG/ML IJ SOLN
1 mg/mL | INTRAMUSCULAR | Status: AC
Start: 2014-07-30 — End: ?

## 2014-07-30 MED ORDER — FENTANYL CITRATE (PF) 50 MCG/ML IJ SOLN
50 mcg/mL | INTRAMUSCULAR | Status: DC | PRN
Start: 2014-07-30 — End: 2014-07-30
  Administered 2014-07-30 (×4): via INTRAVENOUS

## 2014-07-30 MED ORDER — CEFAZOLIN 2 GRAM/50 ML NS IVPB
Freq: Once | INTRAVENOUS | Status: AC
Start: 2014-07-30 — End: 2014-07-30
  Administered 2014-07-30: 12:00:00 via INTRAVENOUS

## 2014-07-30 MED ORDER — DOCUSATE SODIUM 100 MG CAP
100 mg | Freq: Every day | ORAL | Status: DC
Start: 2014-07-30 — End: 2014-07-30

## 2014-07-30 MED ORDER — ACETAMINOPHEN-CODEINE 300 MG-30 MG TAB
300-30 mg | ORAL_TABLET | ORAL | Status: DC | PRN
Start: 2014-07-30 — End: 2015-09-28

## 2014-07-30 MED ORDER — LIDOCAINE (PF) 10 MG/ML (1 %) IJ SOLN
10 mg/mL (1 %) | INTRAMUSCULAR | Status: DC | PRN
Start: 2014-07-30 — End: 2014-07-30

## 2014-07-30 MED ORDER — LIDOCAINE HCL 2 % (20 MG/ML) IJ SOLN
20 mg/mL (2 %) | INTRAMUSCULAR | Status: DC | PRN
Start: 2014-07-30 — End: 2014-07-30
  Administered 2014-07-30: 12:00:00 via INTRAVENOUS

## 2014-07-30 MED ORDER — LIDOCAINE-EPINEPHRINE 1 %-1:100,000 IJ SOLN
1 %-:00,000 | INTRAMUSCULAR | Status: DC | PRN
Start: 2014-07-30 — End: 2014-07-30
  Administered 2014-07-30: 12:00:00 via SUBCUTANEOUS

## 2014-07-30 MED ORDER — DOCUSATE SODIUM 100 MG CAP
100 mg | ORAL_CAPSULE | Freq: Two times a day (BID) | ORAL | Status: AC
Start: 2014-07-30 — End: 2014-10-28

## 2014-07-30 MED ORDER — LACTATED RINGERS IV
INTRAVENOUS | Status: DC
Start: 2014-07-30 — End: 2014-07-30
  Administered 2014-07-30 (×2): via INTRAVENOUS

## 2014-07-30 MED ORDER — FENTANYL CITRATE (PF) 50 MCG/ML IJ SOLN
50 mcg/mL | INTRAMUSCULAR | Status: AC
Start: 2014-07-30 — End: ?

## 2014-07-30 MED ORDER — ACETAMINOPHEN-CODEINE 300 MG-30 MG TAB
300-30 mg | ORAL | Status: DC | PRN
Start: 2014-07-30 — End: 2014-07-30

## 2014-07-30 MED ORDER — BUPIVACAINE LIPOSOME (PF) 266 MG/20 ML (13.3 MG/ML) SUSP, INFILTRATION
1.3 % (13.3 mg/mL) | Status: AC
Start: 2014-07-30 — End: ?

## 2014-07-30 MED ORDER — ONDANSETRON (PF) 4 MG/2 ML INJECTION
4 mg/2 mL | INTRAMUSCULAR | Status: DC | PRN
Start: 2014-07-30 — End: 2014-07-30

## 2014-07-30 MED ORDER — LIDOCAINE (PF) 20 MG/ML (2 %) IV SYRINGE
100 mg/5 mL (2 %) | INTRAVENOUS | Status: AC
Start: 2014-07-30 — End: ?

## 2014-07-30 MED ORDER — SODIUM CHLORIDE 0.9% BOLUS IV
0.9 % | Freq: Once | INTRAVENOUS | Status: AC
Start: 2014-07-30 — End: 2014-07-30
  Administered 2014-07-30: 23:00:00 via INTRAVENOUS

## 2014-07-30 MED ORDER — LACTATED RINGERS IV
INTRAVENOUS | Status: DC
Start: 2014-07-30 — End: 2014-07-30

## 2014-07-30 MED ORDER — CEFAZOLIN 1 GRAM SOLUTION FOR INJECTION
1 gram | Freq: Once | INTRAMUSCULAR | Status: DC
Start: 2014-07-30 — End: 2014-07-30

## 2014-07-30 MED ORDER — LIDOCAINE-EPINEPHRINE 1 %-1:100,000 IJ SOLN
1 %-:00,000 | INTRAMUSCULAR | Status: AC
Start: 2014-07-30 — End: ?

## 2014-07-30 MED FILL — CEFAZOLIN 2 GRAM/50 ML NS IVPB: INTRAVENOUS | Qty: 50

## 2014-07-30 MED FILL — MIDAZOLAM 1 MG/ML IJ SOLN: 1 mg/mL | INTRAMUSCULAR | Qty: 2

## 2014-07-30 MED FILL — BUPIVACAINE (PF) 0.5 % (5 MG/ML) IJ SOLN: 0.5 % (5 mg/mL) | INTRAMUSCULAR | Qty: 30

## 2014-07-30 MED FILL — LACTATED RINGERS IV: INTRAVENOUS | Qty: 1000

## 2014-07-30 MED FILL — LIDOCAINE-EPINEPHRINE 1 %-1:100,000 IJ SOLN: 1 %-:00,000 | INTRAMUSCULAR | Qty: 20

## 2014-07-30 MED FILL — LIDOCAINE (PF) 20 MG/ML (2 %) IV SYRINGE: 100 mg/5 mL (2 %) | INTRAVENOUS | Qty: 5

## 2014-07-30 MED FILL — DOK 100 MG CAPSULE: 100 mg | ORAL | Qty: 1

## 2014-07-30 MED FILL — BD POSIFLUSH NORMAL SALINE 0.9 % INJECTION SYRINGE: INTRAMUSCULAR | Qty: 10

## 2014-07-30 MED FILL — EXPAREL (PF) 1.3 % (13.3 MG/ML) SUSPENSION FOR LOCAL INFILTRATION: 1.3 % (13.3 mg/mL) | Qty: 20

## 2014-07-30 MED FILL — FENTANYL CITRATE (PF) 50 MCG/ML IJ SOLN: 50 mcg/mL | INTRAMUSCULAR | Qty: 2

## 2014-07-30 MED FILL — DIPRIVAN 10 MG/ML INTRAVENOUS EMULSION: 10 mg/mL | INTRAVENOUS | Qty: 50

## 2014-07-30 MED FILL — LTA PRE-ATTACHED 4 % LARYNGOTRACHEAL SOLUTION: 4 % | LARYNGEAL | Qty: 4

## 2014-07-30 MED FILL — SODIUM CHLORIDE 0.9 % IV: INTRAVENOUS | Qty: 1000

## 2014-07-30 NOTE — Anesthesia Post-Procedure Evaluation (Signed)
Post-Anesthesia Evaluation and Assessment    Patient: Dylan Haney MRN: [id number]  SSN: [id number]    Date of Birth: 1924/06/08  Age: 81 y.o.  Sex: male       Cardiovascular Function/Vital Signs  Visit Vitals   Item Reading   ??? BP 135/81 mmHg   ??? Pulse 96   ??? Temp 36.5 ??C (97.7 ??F)   ??? Resp 22   ??? SpO2 90%       Patient is status post MAC anesthesia for Procedure(s):  OPEN RIGHT INGUINAL HERNIA REPAIR WITH MESH.    Nausea/Vomiting: None    Postoperative hydration reviewed and adequate.    Pain:  Pain Scale 1: Numeric (0 - 10) (07/30/14 [id number])  Pain Intensity 1: 0 (07/30/14 0918)   Managed    Neurological Status:   Neuro (WDL): Within Defined Limits (07/30/14 0918)   At baseline    Mental Status and Level of Consciousness: Alert and oriented     Pulmonary Status:   O2 Device: Room air (07/30/14 0954)   Adequate oxygenation and airway patent    Complications related to anesthesia: None    Post-anesthesia assessment completed. No concerns    Signed By: Ronaldo MiyamotoScott F Anja Neuzil, MD     July 30, 2014

## 2014-07-30 NOTE — Brief Op Note (Signed)
BRIEF OPERATIVE NOTE    Date of Procedure: 07/30/2014   Preoperative Diagnosis: RIGHT INGUINAL HERNIA  Postoperative Diagnosis: RIGHT DIRECT INGUINAL HERNIA    Procedure(s):  OPEN RIGHT INGUINAL HERNIA REPAIR WITH MESH  Surgeon(s) and Role:     * Lerry LinerBryan A Imo Cumbie, MD - Primary  Anesthesia: MAC   Estimated Blood Loss: MIn  Specimens:   ID Type Source Tests Collected by Time Destination   1 : hernia sac Preservative Hernia Sac  Lerry LinerBryan A Vernon Ariel, MD 07/30/2014 0827 Pathology   2 : spermatic cord lipoma Preservative Other                  Lerry LinerBryan A Jahni Nazar, MD 07/30/2014 [phone] Pathology      Findings: Direct sac, cord lipoma.  Nerve preserved and protected  Complications: none  Implants:   Implant Name Type Inv. Item Serial No. Manufacturer Lot No. LRB No. Used Action   MESH HERN PROL EXT 10CM POLYPR --  - [id number]   MESH HERN PROL EXT 10CM POLYPR --  na [employer] [id number] Right 1 Implanted

## 2014-07-30 NOTE — ED Notes (Signed)
Bedside and Verbal shift change report given to RN Denise (oncoming nurse) by RN Corrie (offgoing nurse). Report included the following information SBAR, Kardex, ED Summary, MAR, Recent Results and Med Rec Status.

## 2014-07-30 NOTE — Anesthesia Pre-Procedure Evaluation (Addendum)
Anesthetic History               Review of Systems / Medical History  Patient summary reviewed    Pulmonary                Comments: Quit smoking 20 yrs ago   Neuro/Psych              Cardiovascular            Dysrhythmias : atrial fibrillation  Past MI      Comments: MI 40 yrs ago   GI/Hepatic/Renal               Comments: Alcohol abuse Endo/Other        Arthritis     Other Findings            Physical Exam    Airway  Mallampati: III    Neck ROM: normal range of motion   Mouth opening: Normal     Cardiovascular    Rhythm: irregular  Rate: normal         Dental    Dentition: Edentulous     Pulmonary  Breath sounds clear to auscultation               Abdominal         Other Findings            Anesthetic Plan    ASA: 3  Anesthesia type: MAC            Anesthetic plan and risks discussed with: Patient and Family      Informed consent obtained.

## 2014-07-30 NOTE — ED Provider Notes (Signed)
HPI Comments: 81 y.o. male with past medical history significant for alcohol abuse, AFib, cardiomyopathy, arrythmia, reynolds syndrome, MI, skin cancer, heart murmur and fatty liver disease who presents from home for evaluation after a post OP complication. Pt states that he received surgery to correct his right inguinal hernia this morning when afterwards he experienced episodes of vomiting, nausea and diarrhea as well as bleeding at the incision site. Pt states that he hasn't eaten anything but a cracker today. Pt claims that he feels fine now. Pt denies pain. There are no other acute medical concerns at this time.    PCP: Jennye Boroughs, MD  Surgery: Lerry Liner, MD    Note written by [place] P. Hiegel, Scribe, as dictated by Luz Brazen, MD 5:25 PM            The history is provided by the patient. No language interpreter was used.        Past Medical History:   Diagnosis Date   ??? Alcohol abuse    ??? Ill-defined condition      seasonal allergies., fatty liver, alcoholism   ??? Ill-defined condition      a-fib   ??? Cardiomyopathy (HCC) 09/11/2012     A.  Echo (09/11/12):  EF 35-40% with mod GHK.  Mildly dil RV.  Mod dil RA/LA.  Mild MR.  Mod-severe TR.  Mod PR.  PASP 60.   ??? Arrhythmia      A-Fib   ??? Arthritis      osteoporosis   ??? Reynolds syndrome [facility])    ??? MI (myocardial infarction) (HCC)      states 40 years ago   ??? Cancer (HCC)      skin cancer basal cell   ??? Heart murmur    ??? Liver disease      fatty liver       Past Surgical History:   Procedure Laterality Date   ??? Hx orthopaedic  1960s     index finger laceration repaired         Family History:   Problem Relation Age of Onset   ??? No Known Problems Mother        History     Social History   ??? Marital Status: WIDOWED     Spouse Name: N/A   ??? Number of Children: N/A   ??? Years of Education: N/A     Occupational History   ??? Not on file.     Social History Main Topics   ??? Smoking status: Former Smoker -- 1.00 packs/day for 50 years      Quit date: 07/22/1992   ??? Smokeless tobacco: Never Used   ??? Alcohol Use: 0.0 oz/week      Comment: States he drinks 2 small glasses of Vodka a week   ??? Drug Use: No   ??? Sexual Activity: Not on file     Other Topics Concern   ??? Not on file     Social History Narrative         ALLERGIES: Review of patient's allergies indicates no known allergies.    Review of Systems   Constitutional: Negative for fever, chills and fatigue.   HENT: Negative for sore throat.    Eyes: Negative for discharge and redness.   Respiratory: Negative for cough, chest tightness, shortness of breath and wheezing.    Cardiovascular: Negative for chest pain and leg swelling.   Gastrointestinal: Positive for nausea, vomiting and diarrhea. Negative for  abdominal pain and constipation.   Genitourinary: Negative for urgency, frequency and hematuria.   Musculoskeletal: Negative for back pain, joint swelling, neck pain and neck stiffness.   Skin: Negative for rash and wound.   Neurological: Negative for dizziness, weakness and headaches.   Psychiatric/Behavioral: Negative for confusion and agitation.   All other systems reviewed and are negative.      Filed Vitals:    07/30/14 1708   BP: 121/74   Pulse: 117   Temp: 97.7 ??F (36.5 ??C)   Resp: 16   Height: 5\' 8"  (1.727 m)   Weight: 63.504 kg (140 lb)            Physical Exam   Constitutional: He is oriented to person, place, and time. He appears well-developed and well-nourished.   HENT:   Head: Normocephalic and atraumatic.   Eyes: Conjunctivae and EOM are normal. Pupils are equal, round, and reactive to light.   Neck: Normal range of motion. Neck supple.   Cardiovascular: Normal rate, regular rhythm, normal heart sounds and intact distal pulses.  Exam reveals no gallop and no friction rub.    No murmur heard.  Pulmonary/Chest: Effort normal and breath sounds normal. No respiratory distress. He has no wheezes. He has no rales. He exhibits no tenderness.    Abdominal: Soft. Bowel sounds are normal. He exhibits no distension. There is no tenderness. There is no guarding.   Bandage over right inguinal incision site, mildly saturated with bright red blood. No obvious extravagation.    Musculoskeletal: Normal range of motion. He exhibits no edema or tenderness.   Lymphadenopathy:     He has no cervical adenopathy.   Neurological: He is alert and oriented to person, place, and time. He has normal reflexes. Coordination normal.   Skin: Skin is warm and dry. No rash noted.   Psychiatric: He has a normal mood and affect. His behavior is normal.   Nursing note and vitals reviewed.   Note written by [place] P. Hiegel, Scribe, as dictated by Luz Brazeneborah T Lilliemae Fruge, MD 5:25 PM      MDM  Number of Diagnoses or Management Options  S/P inguinal hernia repair:   Diagnosis management comments: Nausea and bleeding around incision after inguinal hernia repair earlier today. Minimal bleeding noted over incision. Dressing intact. No abdominal tenderness. Additionally pt has no ongoing nausea at this time. HR improved to 85 with IVF. Evaluated by Dr. Sedalia Mutaox with no concern at this time for life threatening post-operative bleeding. Tolerating PO. Suspect nausea related to anesthesia and given it has completely resolved upon arrival to ED, will hold off on any imaging at this time. Pt feeling well and to return home with daughter. Given very strict return precautions.       Procedures    CONSULT NOTE:  6:58 PM Luz Brazeneborah T Kham Zuckerman, MD spoke with Dr. Elvera Lennoxiane R Cox, MD, Consult for Surgery.  Discussed available diagnostic tests and clinical findings.  She is in agreement with care plans as outlined.  Dr. Elvera Lennoxiane R Cox, MD recommends in the OR and will call Dr. Helen HashimotoVinton back in 20 minutes.

## 2014-07-30 NOTE — Other (Signed)
Pt fall protocol  Yellow arm band on patient, yellow non skid socks on   bed in low position, all side rails up, call bell in reach  Pt  & family instructed in "call don't fall" protocol     Use your call bell,and wait for assistance, staff not family will assist you to get up &   move about  Pt & family verbalize understanding of fall precautions and "call don't fall" protocol

## 2014-07-30 NOTE — ED Notes (Signed)
Pt had surgery this am; starting n/v/d at noon, break thru bleeding on dressing. Surgeon was notified.

## 2014-07-30 NOTE — ED Notes (Signed)
Bedside and Verbal shift change report given to RN Angelique Blonderenise (oncoming nurse) by Marlana SalvageN Corrie (offgoing nurse).

## 2014-07-30 NOTE — Consults (Signed)
Surgery Consult    Subjective:      Dylan Haney is a 81 y.o. male who I was asked to see by the [facility] ER for incisional bleeding.  Pt reports that he had hernia repair earlier today from my partner Dr. Laural Benes.  He had been home, then noticed some bloody drainage on the dressing.  He came to the ER for another reason, but also wanted the drainage checked.  No racing heart, no fevers, no increased pain.  Bleeding has since stopped.  Past Medical History   Diagnosis Date   ??? Alcohol abuse    ??? Ill-defined condition      seasonal allergies., fatty liver, alcoholism   ??? Ill-defined condition      a-fib   ??? Cardiomyopathy (HCC) 09/11/2012     A.  Echo (09/11/12):  EF 35-40% with mod GHK.  Mildly dil RV.  Mod dil RA/LA.  Mild MR.  Mod-severe TR.  Mod PR.  PASP 60.   ??? Arrhythmia      A-Fib   ??? Arthritis      osteoporosis   ??? Reynolds syndrome [facility])    ??? MI (myocardial infarction) (HCC)      states 40 years ago   ??? Cancer (HCC)      skin cancer basal cell   ??? Heart murmur    ??? Liver disease      fatty liver     Past Surgical History   Procedure Laterality Date   ??? Hx orthopaedic  1960s     index finger laceration repaired      Family History   Problem Relation Age of Onset   ??? No Known Problems Mother      History     Social History   ??? Marital Status: WIDOWED     Spouse Name: N/A   ??? Number of Children: N/A   ??? Years of Education: N/A     Social History Main Topics   ??? Smoking status: Former Smoker -- 1.00 packs/day for 50 years     Quit date: 07/22/1992   ??? Smokeless tobacco: Never Used   ??? Alcohol Use: 0.0 oz/week      Comment: States he drinks 2 small glasses of Vodka a week   ??? Drug Use: No   ??? Sexual Activity: Not on file     Other Topics Concern   ??? None     Social History Narrative      No current facility-administered medications for this encounter.     Current Outpatient Prescriptions   Medication Sig   ??? docusate sodium (COLACE) 100 mg capsule Take 1 Cap by mouth two (2) times a day for 90 days.    ??? VITAMIN B-1 100 mg tablet TAKE ONE TABLET BY MOUTH EVERY   ??? folic acid 400 mcg tablet Take 1 tablet by mouth daily.   ??? cholecalciferol (VITAMIN D3) 1,000 unit tablet Take 1 tablet by mouth daily.   ??? aspirin 81 mg chewable tablet Take 1 Tab by mouth daily.   ??? acetaminophen-codeine (TYLENOL #3) 300-30 mg per tablet Take 1-2 Tabs by mouth every four (4) hours as needed for Pain. Max Daily Amount: 12 Tabs. Indications: PAIN      No Known Allergies    Review of Systems:  A comprehensive review of systems was negative except for:  Negative except for bleeding from hernia incision   Objective:      Patient Vitals for the past 8  hrs:   BP Temp Pulse Resp SpO2 Height Weight   07/30/14 2015 130/75 mmHg - - - 98 % - -   07/30/14 2000 134/70 mmHg - - - 99 % - -   07/30/14 1855 - - - - 95 % - -   07/30/14 1815 135/64 mmHg - - - 98 % - -   07/30/14 1800 136/65 mmHg - - - 98 % - -   07/30/14 1753 - - - - 99 % - -   07/30/14 1747 145/74 mmHg - - - - - -   07/30/14 1708 121/74 mmHg 97.7 ??F (36.5 ??C) (!) 117 16 - 5\' 8"  (1.727 m) 63.504 kg (140 lb)       Temp (24hrs), Avg:97.7 ??F (36.5 ??C), Min:97.6 ??F (36.4 ??C), Max:97.7 ??F (36.5 ??C)      Physical Exam:  General:  Alert, cooperative, no distress, appears stated age.   Eyes:  Conjunctivae/corneas clear.    HENT: Normocephalic, atraumatic       Neck: Supple, no JVD.   Back:   Symmetric, no curvature. ROM normal. No CVA tenderness.   Lungs:   Clear to auscultation bilaterally.   Heart:  Regular rate and rhythm, S1, S2 normal, no murmur, click, rub or gallop.   Abdomen:   Soft, non-tender. Bowel sounds normal. No masses,  No organomegaly.   Musculoskeletal: Extremities normal, atraumatic, no cyanosis or edema.   PSych: Normal affect   Skin: Right groin incision with dressing intact. NO active oozing.  Blood tinged drainage on dressing.   Neuro: Grossly intact      Labs: Recent Labs      07/30/14   1741   WBC  8.4   HGB  10.3*   HCT  30.7*   PLT  170      No results for input(s): NA, K, CL, CO2, GLU, BUN, CREA, CA, MG, PHOS, ALB, TBIL, TBILI, SGOT, ALT in the last 72 hours.  No results for input(s): INR in the last 72 hours.    Invalid input(s): INREXT      Assessment:   Post op bleeding of incision    Plan:     Pt has stopped bleeding at this time.  NO need for surgical intervention.  Follow up with Dr. Laural BenesJohnson as scheduled.   Signed By: Izora Galaiane R. Orion Mole, MD     July 30, 2014

## 2014-07-30 NOTE — H&P (Signed)
Patient interviewed and examined again.  No change to paper H/P.    Leonor Darnell A Kimla Furth, MD

## 2014-07-30 NOTE — Op Note (Signed)
OPERATIVE NOTE    Date of Procedure: 07/30/2014   Preoperative Diagnosis: RIGHT INGUINAL HERNIA  Postoperative Diagnosis: RIGHT DIRECT INGUINAL HERNIA    Procedure(s):  OPEN RIGHT INGUINAL HERNIA REPAIR WITH MESH  Surgeon(s) and Role:     * Lerry Liner, MD - Primary  Anesthesia: MAC   Estimated Blood Loss: MIn  Specimens:   ID Type Source Tests Collected by Time Destination   1 : hernia sac Preservative Hernia Sac  Lerry Liner, MD 07/30/2014 0827 Pathology   2 : spermatic cord lipoma Preservative Other                  Lerry Liner, MD 07/30/2014 [phone] Pathology      Findings: Direct sac, cord lipoma.  Nerve preserved and protected  Complications: none  Implants:     Implant Name Type Inv. Item Serial No. Manufacturer Lot No. LRB No. Used Action   MESH HERN PROL EXT 10CM POLYPR --  - [id number]   MESH HERN PROL EXT 10CM POLYPR --  na [id number] Implanted     Indication:  See H/P.    Procedure:   After consent was obtained, the patient was taken back to the operating room and placed in a supine position.  A time out was completed verifying correct patient, procedure, site, positioning and any special equipment needs.  After induction of monitored anesthesia, the abdomen was prepped and draped in normal sterile fashion.  1% Lidocaine with epinephrine was administered initially as local anesthetic.  A local field block was produced along the proposed skin incision on the right lower abdomen. A skin wheal was then raised 1 cm lateral and superior to the anterior superior iliac spine and right ilioinguinal nerve block administered.   A 5cm groin crease incision was made and deepened through Scarpa's fascia. Additional boluses of local anesthetic were given as the dissection proceeded towards the deeper tissues.  The external abdominal oblique was opened to the internal ring, parallel to its fibers.  The external ring was exposed with a large right inguinal hernia sac.  The  right ilioinguinal nerve was identified and protected throughout the dissection.  Flaps of the external oblique were developed cephalad and inferiorly.  The spermatic cord was identified.  It was gently dissected free from the hernia sac, where the indirect hernia sac continued.   It was controlled at the external ring using a Penrose drain.  The sac was carefully dissected free down to the level of the internal ring.   A finger was passed into the peritoneal cavity and the floor of the inguinal canal was assessed and found to be weak.  The femoral canal was palpated and no hernia identified.  The sac was suture ligated and passed off.  Attention was then turned to the floor of the canal, which was intact.   The extended prolene hernia system was prepared.  The pre-peritoneal space was developed bluntly to accommodate the underlay portion.  The underlay patch was placed and satisfactorily covered the myopectineal orifice.  The onlay was sutured to the inguinal ligament inferiorly and the conjoint tendon superiorly using multiple interrupted 2-0 Prolene sutures. Care was taken to assure the mesh was placed in a relaxed fashion to avoid excessive tension and no neurovascular structures were caught in the repair.  A keyhole slit was fashioned to accommodate the cord structures through both the hernia system and the 3x6cm patch.  Laterally, the tails were approximated  and the internal ring was re-created, allowing for passage of the surgeon's fifth fingertip.  Hemostasis was checked and satisfactory.  The penrose was removed and the cord was then returned to its normal position.  20 ml of Exparel was re-constituted with 30 ml of 0.5% plain marcaine.  This was injected into the preperitoneal spaces, the preostial right pubic fascia, and the external abdominal oblique fascia.  The external oblique was closed over it with 2-0 PDS running suture, being careful not to catch the ilioinguinal nerve in the suture line. Scarpa's  fascia was closed with interrupted 3-0 Vicryl.  The dermis was closed with multiple interrupted 3-0 vicyrl sutures.  The skin was closed with 4-0 monocryl and steristrips.  The patient tolerated the procedure well.  Instrument and sponge counts were correct at the completion of the right side.  The patient tolerated the procedure well.  Instrument and sponge counts were correct at the completion of the case.    The patient was delivered to the PACU in stable condition.  I went to discuss the findings of the surgery with the daughter in the waiting area.    Lerry LinerBryan A Neyah Ellerman, MD

## 2014-07-30 NOTE — Other (Signed)
Dylan BanJohn Haney performed hair clipping right groin/abdomenal area.

## 2014-07-30 NOTE — Progress Notes (Signed)
[facility]: MED RECONCILIATION    Medications Removed:  ?? Fluoxetine - daughter reports that although this was on the list provided, he has not taken in over two years.  Comments/Recommendations:  ?? Patient has picked up prescription for acetaminophen with codeine but has not yet started taking.   ?? He was also prescribed stool softener (docusate), but daughter reports that patient already with loose stools. Last dose was yesterday.    Information obtained from: patient's daughter, medication list provided for review    Significant PMH/Disease States:   Past Medical History   Diagnosis Date   ??? Alcohol abuse    ??? Ill-defined condition      seasonal allergies., fatty liver, alcoholism   ??? Ill-defined condition      a-fib   ??? Cardiomyopathy (HCC) 09/11/2012     A.  Echo (09/11/12):  EF 35-40% with mod GHK.  Mildly dil RV.  Mod dil RA/LA.  Mild MR.  Mod-severe TR.  Mod PR.  PASP 60.   ??? Arrhythmia      A-Fib   ??? Arthritis      osteoporosis   ??? Reynolds syndrome [facility](HCC)    ??? MI (myocardial infarction) (HCC)      states 40 years ago   ??? Cancer (HCC)      skin cancer basal cell   ??? Heart murmur    ??? Liver disease      fatty liver     Chief Complaint for this Admission:   Chief Complaint   Patient presents with   ??? Post OP Complication   ??? Vomiting   ??? Diarrhea     Allergies: Review of patient's allergies indicates no known allergies.    Prior to Admission Medications:     Medication Documentation Review Audit     Reviewed by Cathrine MusterBrad L Ferguson, PHARMD (Pharmacist) on 07/30/14 at 1809       Medication Sig Documenting Provider Last Dose Status Taking?    acetaminophen-codeine (TYLENOL #3) 300-30 mg per tablet Take 1-2 Tabs by mouth every four (4) hours as needed for Pain. Max Daily Amount: 12 Tabs. Indications: PAIN Lerry LinerBryan A Johnson, MD  Active No             Med Note Emelda Fear(FERGUSON, Madelin RearBRAD L   Fri Jul 30, 2014  6:08 PM): Prescribed and picked up, but not taking yet      aspirin 81 mg chewable tablet Take 1 Tab by mouth daily. Jennye BoroughsPaul Jackson V,  MD 07/29/2014 am Active Yes    cholecalciferol (VITAMIN D3) 1,000 unit tablet Take 1 tablet by mouth daily. Jennye BoroughsPaul Jackson V, MD 07/29/2014 am Active Yes    docusate sodium (COLACE) 100 mg capsule Take 1 Cap by mouth two (2) times a day for 90 days. Lerry LinerBryan A Johnson, MD 07/29/2014 am Active Yes    folic acid 400 mcg tablet Take 1 tablet by mouth daily. Jennye BoroughsPaul Jackson V, MD 07/29/2014 am Active Yes    VITAMIN B-1 100 mg tablet TAKE ONE TABLET BY MOUTH EVERY Jennye BoroughsPaul Jackson V, MD 07/29/2014 am Active Yes              Thank you,  Brad L. Butch PennyFerguson, Pharm D  [phone]

## 2014-07-30 NOTE — ED Notes (Signed)
Dr. Vinton at bedside.

## 2014-07-30 NOTE — ED Notes (Signed)
Provider reviewed discharge instructions with the patient and family.  The patient and family verbalized understanding.  Patient taken from ED in wheelchair.

## 2014-07-30 NOTE — Discharge Summary (Signed)
Discharge Summary    Patient: Dylan Haney               Sex: male          DOA: 07/30/2014  5:16 AM       Date of Birth:  [birth year]      Age:  81 y.o.        LOS:                 Discharge Date: 07/30/2014    Admission Diagnoses: Right INGUINAL HERNIA    Discharge Diagnoses:  Right direct inguinal hernia    Procedure:  Procedure(s):  OPEN RIGHT INGUINAL HERNIA REPAIR WITH MESH    Discharge Condition: Good    Hospital Course: Uremarkable operative procedure.  Discharge to home in stable condition.      Consults: None    Significant Diagnostic Studies: Not applicable    Discharge Medications:     Current Discharge Medication List      START taking these medications    Details   acetaminophen-codeine (TYLENOL #3) 300-30 mg per tablet Take 1-2 Tabs by mouth every four (4) hours as needed for Pain. Max Daily Amount: 12 Tabs. Indications: PAIN  Qty: 30 Tab, Refills: 0      docusate sodium (COLACE) 100 mg capsule Take 1 Cap by mouth two (2) times a day for 90 days.  Qty: 60 Cap, Refills: 2         CONTINUE these medications which have NOT CHANGED    Details   aspirin 81 mg chewable tablet Take 1 Tab by mouth daily.  Qty: 90 Tab, Refills: 3    Associated Diagnoses: A-fib (HCC); Murmur, cardiac      VITAMIN B-1 100 mg tablet TAKE ONE TABLET BY MOUTH EVERY  Qty: 30 Tab, Refills: 0      fluoxetine (PROZAC) 10 mg capsule Take 1 capsule by mouth daily.  Qty: 30 capsule, Refills: 3    Associated Diagnoses: Anxiety      folic acid 400 mcg tablet Take 1 tablet by mouth daily.  Qty: 30 tablet, Refills: 11    Associated Diagnoses: Atrial fibrillation (HCC); Cardiomyopathy (HCC); ETOH abuse      cholecalciferol (VITAMIN D3) 1,000 unit tablet Take 1 tablet by mouth daily.  Qty: 30 tablet, Refills: 11    Associated Diagnoses: ETOH abuse           Activity/Diet/Wound Care: See patient administered discharge instructions.    Follow-up: 2 weeks    Lerry LinerBryan A Daequan Kozma, MD  [facility]  Pager:  [phone]   Office:  [phone]

## 2014-07-31 MED ORDER — ONDANSETRON HCL 4 MG TAB
4 mg | ORAL_TABLET | Freq: Three times a day (TID) | ORAL | Status: DC | PRN
Start: 2014-07-31 — End: 2015-09-28

## 2014-08-06 MED FILL — DIPRIVAN 10 MG/ML INTRAVENOUS EMULSION: 10 mg/mL | INTRAVENOUS | Qty: 80

## 2014-08-06 MED FILL — LIDOCAINE HCL 2 % (20 MG/ML) IJ SOLN: 20 mg/mL (2 %) | INTRAMUSCULAR | Qty: 40

## 2014-08-06 MED FILL — DIPRIVAN 10 MG/ML INTRAVENOUS EMULSION: 10 mg/mL | INTRAVENOUS | Qty: 458.24

## 2015-03-30 NOTE — Telephone Encounter (Signed)
Per call from patient daughter [name]), handicap placard expired 1/17. Asking if she can drop form off to be completed? Offered appt and she declined stating her father is 80 years old.  Daughter was seeking appointment for medicare wellness also, explained no appointment available at this time. Placed on " to call list" for April 2017.      Call [phone]      thanks

## 2015-03-30 NOTE — Telephone Encounter (Signed)
Patient daughter aware appointment required.

## 2015-05-09 ENCOUNTER — Encounter: Attending: Family Medicine | Primary: Family Medicine

## 2015-05-10 ENCOUNTER — Ambulatory Visit: Admit: 2015-05-10 | Discharge: 2015-05-10 | Payer: MEDICARE | Primary: Family Medicine

## 2015-05-10 DIAGNOSIS — Z9181 History of falling: Secondary | ICD-10-CM

## 2015-05-10 NOTE — Progress Notes (Signed)
Dylan Haney is a 82 y.o. male      Issues discussed today include:        Signs and symptoms:  Series of falls  Duration:  Drinks alcohol frequently  Context:  5 falls over last few weeks  Location:  No injury but some ER visits  Quality:  +fall risk  Severity:  High risk  Timing:  now  Modifying factors:  HH for home PT, needs MSW for help in home safety.  He must not drive    Data reviewed or ordered today:      Other problems include:  Patient Active Problem List   Diagnosis Code   ??? Falls W19.Lorne Skeens   ??? ETOH abuse F10.10   ??? Atrial fibrillation (HCC) I48.91   ??? Elevated bilirubin R17   ??? Unspecified vitamin D deficiency E55.9   ??? Macrocytosis D75.89   ??? NASH (nonalcoholic steatohepatitis) K75.81   ??? Cardiomyopathy (HCC) I42.9   ??? Health care maintenance Z00.00   ??? Advance directive discussed with patient Z71.89   ??? Healthcare maintenance Z00.00       Medications:  Current Outpatient Prescriptions   Medication Sig Dispense Refill   ??? VITAMIN B-1 100 mg tablet TAKE ONE TABLET BY MOUTH EVERY 30 Tab 0   ??? folic acid 400 mcg tablet Take 1 tablet by mouth daily. 30 tablet 11   ??? cholecalciferol (VITAMIN D3) 1,000 unit tablet Take 1 tablet by mouth daily. 30 tablet 11   ??? aspirin 81 mg chewable tablet Take 1 Tab by mouth daily. 90 Tab 3   ??? acetaminophen-codeine (TYLENOL #3) 300-30 mg per tablet Take 1-2 Tabs by mouth every four (4) hours as needed for Pain. Max Daily Amount: 12 Tabs. Indications: PAIN 30 Tab 0   ??? ondansetron hcl (ZOFRAN, AS HYDROCHLORIDE,) 4 mg tablet Take 1 Tab by mouth every eight (8) hours as needed for Nausea. 10 Tab 0       Allergies:  No Known Allergies    LMP:  No LMP for male patient.    Social History     Social History   ??? Marital status: WIDOWED     Spouse name: N/A   ??? Number of children: N/A   ??? Years of education: N/A     Occupational History   ??? Not on file.     Social History Main Topics   ??? Smoking status: Former Smoker     Packs/day: 1.00     Years: 50.00      Quit date: 07/22/1992   ??? Smokeless tobacco: Never Used   ??? Alcohol use 0.0 oz/week      Comment: States he drinks 2 small glasses of Vodka a week   ??? Drug use: No   ??? Sexual activity: Not on file     Other Topics Concern   ??? Not on file     Social History Narrative         Family History   Problem Relation Age of Onset   ??? No Known Problems Mother          Meaningful use:  done      ROS:  Headaches:  no  Chest Pain:  no  SOB:  no  Fevers:  no  Falls:  no  anxiety/depression/losing interest in doing things that were previously enjoyed:  no.  PHQ2 = 2, PHQ9 = 4  Other significant ROS:      No LMP for male patient.  Physical Exam  Visit Vitals   ??? BP 144/79   ??? Pulse 64   ??? Temp 97.3 ??F (36.3 ??C)   ??? Resp 14   ??? Ht 5\' 8"  (1.727 m)   ??? Wt 137 lb (62.1 kg)   ??? SpO2 100%   ??? BMI 20.83 kg/m2     BP Readings from Last 3 Encounters:   05/10/15 144/79   07/30/14 130/75   07/30/14 (!) 140/91     Constitutional:  Appears well,  No Acute Distress, Vitals noted  Psychiatric:   Affect normal, Alert and cooperative, Oriented to person/place/time    Eyes:   Pupils equally round and reactive, EOMI, conjunctiva clear, eyelids normal  ENT:   External ears and nose normal/lips, teeth=OK/gums normal, TMs and Orophyarynx normal  Neck:   general inspection and Thyroid normal.  No abnormal cervical or supraclavicular nodes    Lungs:   clear to auscultation, good respiratory effort  Heart:   Ausculation normal.  Regular rhythm.  No cardiac murmurs.  No carotid bruits or palpable thrills  Chest wall normal  Abd:  benign  Extremities:   without edema, good peripheral pulses  Skin:   Warm to palpation, without rashes, bruising, or suspicious lesions     Neuro:  No facial droop, speech fluent, EOMI, Pupils equally round and reactive to light, visual fields seem OK, HOH,smile symmetrical, puffs out cheeks symmetrically    Shoulder shrug symmetrical     moves all extremities, strength/sensationseem intact and symmetrical     balance seems at risk, no pronator drift, gait abnormal. "get up and go" test only fair, has trouble and balance issues turning    squats OK, heel standing/toe standing OK if he holds on to counter    no tenderness of C spine, T spine, LS spine, flexion/extension of spine OK    Affect seems appropriate, no obvious mental processing problems    MSK:  Full ROM all joints                  Assessment:    Patient Active Problem List   Diagnosis Code   ??? Falls W19.Lorne SkeensXXXA   ??? ETOH abuse F10.10   ??? Atrial fibrillation (HCC) I48.91   ??? Elevated bilirubin R17   ??? Unspecified vitamin D deficiency E55.9   ??? Macrocytosis D75.89   ??? NASH (nonalcoholic steatohepatitis) K75.81   ??? Cardiomyopathy (HCC) I42.9   ??? Health care maintenance Z00.00   ??? Advance directive discussed with patient Z71.89   ??? Healthcare maintenance Z00.00       Today's diagnoses are:    ICD-10-CM ICD-9-CM    1. Risk for falls Z91.81 V15.88 REFERRAL TO SOCIAL WORK      REFERRAL TO HOME HEALTH   2. Glaucoma screening Z13.5 V80.1 REFERRAL TO OPHTHALMOLOGY   3. Alcohol abuse F10.10 305.00 REFERRAL TO SOCIAL WORK      REFERRAL TO HOME HEALTH   4. Screening for depression Z13.89 V79.0 PR DEPRESSION SCREEN ANNUAL       Plan:  Orders Placed This Encounter   ??? REFERRAL TO OPHTHALMOLOGY     Referral Priority:   Routine     Referral Type:   Consultation     Referral Reason:   Specialty Services Required     [facility]     Referred to Provider:   Pryor Ochoaheodore Wu, MD     Requested Specialty:   Ophthalmology   ??? REFERRAL TO SOCIAL  WORK     Referral Priority:   Routine     Referral Type:   Consultation     Referral Reason:   Specialty Services Required     Requested Specialty:   Licensed Clinical Social Worker   ??? REFERRAL TO HOME HEALTH     Referral Priority:   Routine     Referral Type:   Home Health Evaluation     Referral Reason:   Continuity of Care   ??? PR DEPRESSION SCREEN ANNUAL       See patient instructions  Patient Instructions    You cannot drive    Obtain physical therapy for fall prevention    Stop drinking alcohol    Follow up in 2 weeks for review of your health        refresh note:  done    AVS Printed:  done    I spoke with patient and daughter    DMV forms done, see scan.  He must not drive

## 2015-05-10 NOTE — Patient Instructions (Addendum)
You cannot drive    Obtain physical therapy for fall prevention    Stop drinking alcohol    Follow up in 2 weeks for review of your health    Home Health safety evaluation    Please call Garlan FairMinnie to help arrange and authorize any tests and/or referrals.  Her # is [phone]

## 2015-05-16 ENCOUNTER — Encounter: Admit: 2015-05-16 | Discharge: 2015-05-16 | Payer: MEDICARE | Primary: Family Medicine

## 2015-05-16 NOTE — Telephone Encounter (Signed)
Debbie returned call to referrals.      Relayed to her the referral says done.    [id number] - REFERRAL TO HOME HEALTH   1 1      Diagnosis Information   Diagnosis   Z91.81 (ICD-10-CM) - Risk for falls   F10.10 (ICD-10-CM) - Alcohol abuse      Referral Notes   Type Date User   General 05/13/2015 ??2:48 PM Dawn N Grooms      Note   Note     Risk for Falls   PT Admit 4.17.17  Please evaluate patient for alcohol use and falls, home safety evaluation, PT for fall prevention.  ??  Per Garlan FairMinnie [facility]                            Type Date User   General 05/13/2015 11:51 AM Margot AblesMinnie D Bolden      Summary   Home Health      Note   Note     [facility] patient on Monday (05/13/15 to schedule patient for services.Marland Kitchen.Marland Kitchen..Marland Kitchen

## 2015-05-18 ENCOUNTER — Encounter: Admit: 2015-05-18 | Discharge: 2015-05-18 | Payer: MEDICARE | Primary: Family Medicine

## 2015-05-20 ENCOUNTER — Encounter: Admit: 2015-05-20 | Discharge: 2015-05-20 | Payer: MEDICARE | Primary: Family Medicine

## 2015-05-23 ENCOUNTER — Encounter: Admit: 2015-05-23 | Discharge: 2015-05-23 | Payer: MEDICARE | Primary: Family Medicine

## 2015-05-25 ENCOUNTER — Encounter: Admit: 2015-05-25 | Discharge: 2015-05-25 | Payer: MEDICARE | Primary: Family Medicine

## 2015-05-27 ENCOUNTER — Encounter: Admit: 2015-05-27 | Discharge: 2015-05-27 | Payer: MEDICARE | Primary: Family Medicine

## 2015-05-30 ENCOUNTER — Encounter: Admit: 2015-05-30 | Discharge: 2015-05-30 | Payer: MEDICARE | Primary: Family Medicine

## 2015-06-01 ENCOUNTER — Encounter: Admit: 2015-06-01 | Discharge: 2015-06-01 | Payer: MEDICARE | Primary: Family Medicine

## 2015-06-03 ENCOUNTER — Encounter: Primary: Family Medicine

## 2015-06-07 ENCOUNTER — Encounter: Admit: 2015-06-07 | Discharge: 2015-06-07 | Payer: MEDICARE | Primary: Family Medicine

## 2015-08-16 ENCOUNTER — Encounter: Primary: Family Medicine

## 2015-09-28 ENCOUNTER — Ambulatory Visit: Admit: 2015-09-28 | Discharge: 2015-09-28 | Payer: MEDICARE | Primary: Family Medicine

## 2015-09-28 DIAGNOSIS — Z0000 Encounter for general adult medical examination without abnormal findings: Secondary | ICD-10-CM

## 2015-09-28 LAB — AMB POC URINALYSIS DIP STICK AUTO W/ MICRO
Blood (UA POC): NEGATIVE
Glucose (UA POC): NEGATIVE
Leukocyte esterase (UA POC): NEGATIVE
Nitrites (UA POC): NEGATIVE
Specific gravity (UA POC): 1.025 (ref 1.001–1.035)
Urobilinogen (UA POC): 1 (ref 0.2–1)
pH (UA POC): 5.5 (ref 4.6–8.0)

## 2015-09-28 MED ORDER — TAMSULOSIN SR 0.4 MG 24 HR CAP
0.4 mg | ORAL_CAPSULE | Freq: Every day | ORAL | 11 refills | Status: DC
Start: 2015-09-28 — End: 2015-10-19

## 2015-09-28 NOTE — Patient Instructions (Addendum)
Preventing Falls: Care Instructions  Your Care Instructions  Getting around your home safely can be a challenge if you have injuries or health problems that make it easy for you to fall. Loose rugs and furniture in walkways are among the dangers for many older people who have problems walking or who have poor eyesight. People who have conditions such as arthritis, osteoporosis, or dementia also have to be careful not to fall.  You can make your home safer with a few simple measures.  Follow-up care is a key part of your treatment and safety. Be sure to make and go to all appointments, and call your doctor if you are having problems. It's also a good idea to know your test results and keep a list of the medicines you take.  How can you care for yourself at home?  Taking care of yourself  ?? You may get dizzy if you do not drink enough water. To prevent dehydration, drink plenty of fluids, enough so that your urine is light yellow or clear like water. Choose water and other caffeine-free clear liquids. If you have kidney, heart, or liver disease and have to limit fluids, talk with your doctor before you increase the amount of fluids you drink.  ?? Exercise regularly to improve your strength, muscle tone, and balance. Walk if you can. Swimming may be a good choice if you cannot walk easily.  ?? Have your vision and hearing checked each year or any time you notice a change. If you have trouble seeing and hearing, you might not be able to avoid objects and could lose your balance.  ?? Know the side effects of the medicines you take. Ask your doctor or pharmacist whether the medicines you take can affect your balance. Sleeping pills or sedatives can affect your balance.  ?? Limit the amount of alcohol you drink. Alcohol can impair your balance and other senses.  ?? Ask your doctor whether calluses or corns on your feet need to be removed. If you wear loose-fitting shoes because of calluses or corns, you  can lose your balance and fall.  ?? Talk to your doctor if you have numbness in your feet.  Preventing falls at home  ?? Remove raised doorway thresholds, throw rugs, and clutter. Repair loose carpet or raised areas in the floor.  ?? Move furniture and electrical cords to keep them out of walking paths.  ?? Use nonskid floor wax, and wipe up spills right away, especially on ceramic tile floors.  ?? If you use a walker or cane, put rubber tips on it. If you use crutches, clean the bottoms of them regularly with an abrasive pad, such as steel wool.  ?? Keep your house well lit, especially stairways, porches, and outside walkways. Use night-lights in areas such as hallways and bathrooms. Add extra light switches or use remote switches (such as switches that go on or off when you clap your hands) to make it easier to turn lights on if you have to get up during the night.  ?? Install sturdy handrails on stairways.  ?? Move items in your cabinets so that the things you use a lot are on the lower shelves (about waist level).  ?? Keep a cordless phone and a flashlight with new batteries by your bed. If possible, put a phone in each of the main rooms of your house, or carry a cell phone in case you fall and cannot reach a phone. Or, you can wear a   device around your neck or wrist. You push a button that sends a signal for help.  ?? Wear low-heeled shoes that fit well and give your feet good support. Use footwear with nonskid soles. Check the heels and soles of your shoes for wear. Repair or replace worn heels or soles.  ?? Do not wear socks without shoes on wood floors.  ?? Walk on the grass when the sidewalks are slippery. If you live in an area that gets snow and ice in the winter, sprinkle salt on slippery steps and sidewalks.  Preventing falls in the bath  ?? Install grab bars and nonskid mats inside and outside your shower or tub and near the toilet and sinks.  ?? Use shower chairs and bath benches.   ?? Use a hand-held shower head that will allow you to sit while showering.  ?? Get into a tub or shower by putting the weaker leg in first. Get out of a tub or shower with your strong side first.  ?? Repair loose toilet seats and consider installing a raised toilet seat to make getting on and off the toilet easier.  ?? Keep your bathroom door unlocked while you are in the shower.  Where can you learn more?  Go to http://www.healthwise.net/[employer].  Enter G117 in the search box to learn more about "Preventing Falls: Care Instructions."  Current as of: September 02, 2014  Content Version: 11.3  ?? 2006-2017 [employer]. Care instructions adapted under license by [employer] (which disclaims liability or warranty for this information). If you have questions about a medical condition or this instruction, always ask your healthcare professional. [employer] disclaims any warranty or liability for your use of this information.

## 2015-09-28 NOTE — ACP (Advance Care Planning) (Addendum)
Discussed with patient and daughter 09/28/2015:  Full code>  Daughter Dylan Haney would be decision-maker if needed [phone]

## 2015-09-28 NOTE — Progress Notes (Signed)
Dylan Haney is a 82 y.o. male      Issues discussed today include:    WELLNESS     Hearing screen:   done  Vision screening:   done  Depression screening:   done  Fall assessment:   Done, +fall risk s/p PT for fall prevention    BMI: Body mass index is 21.1 kg/(m^2).    Colonoscopy:  refused  TDAP:  2011  Pneumovax:  2013  PCV-13:  2015  Flu shot:  Declined 2017  Zostavax:  Declined 2017  Eye exam:  2016  EKG:  On file    FTF for DME:  done:  Cane, bedside commode, bilateral hearing aids  Advanced directive:  Discussed with patient and daughter 09/28/2015:  Full code>  Daughter Roxy Horseman would be decision-maker if needed [phone]  Specialists:  [facility]  [facility]  [facility]    In general, I advise patients to be as active as possible.  I believe exercise is the key to long life and good health.  The current recommendation is for individuals to exercise for 150 minutes each week (in other words 30 minutes 5 days a week).  Exercise should be vigorous enough to work up a sweat.  These activities include brisk walking, running, tennis, swimming, weight-lifting, etc.     I usually tell folks that work is work and exercise is exercise.  Each of these activities has a different goal.  Even though you may be active at work, it may not be aerobically adequate.  So build dedicated exercise time into your weekly routine.    If a patient drinks alcohol, I suggest that a male drink only 2 beers (or glasses of wine, or shots of liquor) in any 24 hour period ( and not daily).  For females, the limits are one drink per 24 hours (and not daily).  After these limits, the toxic effects of alcohol consumption start to manifest.     Avoid tobacco products.  I may provide separate information discussing specific smoking cessation instructions if needed.      I suggest a wellness exam yearly during your birth month to update health maintenance issues such as fasting labs, EKGs and other studies,  appropriate cancer screenings,  immunizations, etc.      I suggest a yearly flu shot    Please call Garlan Fair to help arrange and authorize any tests or referrals.  Her # is [phone]           Data reviewed or ordered today:  Non fasting labs    Other problems include:  Patient Active Problem List   Diagnosis Code   ??? Falls W19.Lorne Skeens   ??? ETOH abuse F10.10   ??? Atrial fibrillation (HCC) I48.91   ??? Elevated bilirubin R17   ??? Unspecified vitamin D deficiency E55.9   ??? Macrocytosis D75.89   ??? NASH (nonalcoholic steatohepatitis) K75.81   ??? Cardiomyopathy (HCC) I42.9   ??? Health care maintenance Z00.00   ??? Advance directive discussed with patient Z71.89   ??? Healthcare maintenance Z00.00       Medications:  Current Outpatient Prescriptions   Medication Sig Dispense Refill   ??? tamsulosin (FLOMAX) 0.4 mg capsule Take 1 Cap by mouth daily. Indications: SYMPTOMATIC BENIGN PROSTATIC HYPERPLASIA 30 Cap 11   ??? VITAMIN B-1 100 mg tablet TAKE ONE TABLET BY MOUTH EVERY 30 Tab 0   ??? folic acid 400 mcg tablet Take 1 tablet by mouth daily. 30  tablet 11   ??? cholecalciferol (VITAMIN D3) 1,000 unit tablet Take 1 tablet by mouth daily. 30 tablet 11   ??? aspirin 81 mg chewable tablet Take 1 Tab by mouth daily. 90 Tab 3       Allergies:  No Known Allergies    LMP:  No LMP for male patient.    Social History     Social History   ??? Marital status: WIDOWED     Spouse name: N/A   ??? Number of children: N/A   ??? Years of education: N/A     Occupational History   ??? Not on file.     Social History Main Topics   ??? Smoking status: Former Smoker     Packs/day: 1.00     Years: 50.00     Quit date: 07/22/1992   ??? Smokeless tobacco: Never Used   ??? Alcohol use 0.0 oz/week      Comment: States he drinks 2 small glasses of Vodka a week   ??? Drug use: No   ??? Sexual activity: Not on file     Other Topics Concern   ??? Not on file     Social History Narrative         Family History   Problem Relation Age of Onset   ??? Lung Disease Mother    ??? Cancer Father           Meaningful use:  done      ROS:  Headaches:  no  Chest Pain:  no  SOB:  no  Fevers:  no  Falls:  None recently  anxiety/depression/losing interest in doing things that were previously enjoyed:  no.  PHQ2 = 0  Other significant ROS:  HOH  Patient denied problems with:     speaking/swallowing, Reflux/indigestion, Cough,Diarrhea/constipation,Problems passing or controlling urine,  Mood (anxiety/depression/losing interest in doing things that were previously enjoyed), Snoring/sleep apnea,Fatigue, Weight change, memory                                                         Any other Positive ROS include: binge ETOH use (one pint every few days)        Falls in the past 12 months:  yes           Over the last year (or since your last visit):  Have you been diagnosed with heart attack, stroke, broken bones, or skin cancer = no    Exercise:  Walks with cane             Smoking history:  Former    Lives alone.  No longer drives, performs ADLs                                    No LMP for male patient.    Physical Exam  Visit Vitals   ??? BP 132/71   ??? Pulse 77   ??? Temp 97.6 ??F (36.4 ??C) (Oral)   ??? Resp 18   ??? Ht 5\' 8"  (1.727 m)   ??? Wt 138 lb 12.8 oz (63 kg)   ??? SpO2 97%   ??? BMI 21.1 kg/m2     BP Readings from Last 3 Encounters:   09/28/15 132/71  06/07/15 110/60   06/01/15 138/84     Constitutional:  Appears surprisingly well and vigorous,  No Acute Distress, Vitals noted  Psychiatric:   Affect normal, Alert and cooperative, Oriented to person/place/time    Eyes:   Pupils equally round and reactive, EOMI, conjunctiva clear, eyelids normal  ENT:   External ears and nose normal/lips, teeth=OK/gums normal, TMs and Orophyarynx normal, bilateral hearing aids  Neck:   general inspection and Thyroid normal.  No abnormal cervical or supraclavicular nodes    Lungs:   clear to auscultation, good respiratory effort  Heart:   Ausculation normal.  Regular rhythm.  No cardiac murmurs.  No carotid bruits or palpable thrills   Chest wall normal  Abd:  benign  Extremities:   without edema, good peripheral pulses  Skin:   Warm to palpation, without rashes, bruising, or suspicious lesions     Neuro:  No facial droop, speech fluent, EOMI, Pupils equally round and reactive to light, visual fields seem OK, HOH,smile symmetrical, puffs out cheeks symmetrically    Shoulder shrug symmetrical     moves all extremities, strength/sensationseem intact and symmetrical    Rapid alternating movements of hands normal and symmetrical    balance seems at risk, no pronator drift, gait abnormal. "get up and go" test not great    squats OK, heel standing/toe standing OK given OA    no tenderness of C spine, T spine, LS spine, flexion/extension of spine OK    Affect seems appropriate, no obvious mental processing problems    MSK:  Full passive ROM all joints given OA                Assessment:    Patient Active Problem List   Diagnosis Code   ??? Falls W19.Lorne Skeens   ??? ETOH abuse F10.10   ??? Atrial fibrillation (HCC) I48.91   ??? Elevated bilirubin R17   ??? Unspecified vitamin D deficiency E55.9   ??? Macrocytosis D75.89   ??? NASH (nonalcoholic steatohepatitis) K75.81   ??? Cardiomyopathy (HCC) I42.9   ??? Health care maintenance Z00.00   ??? Advance directive discussed with patient Z71.89   ??? Healthcare maintenance Z00.00       Today's diagnoses are:    ICD-10-CM ICD-9-CM    1. Health care maintenance Z00.00 V70.0    2. ETOH abuse F10.10 305.00 HEPATIC FUNCTION PANEL   3. Macrocytosis D75.89 289.89 CBC W/O DIFF   4. Cardiomyopathy, unspecified type (HCC) I42.9 425.4 METABOLIC PANEL, BASIC      LDL, DIRECT   5. Nocturia R35.1 788.43 CULTURE, URINE      AMB POC URINALYSIS DIP STICK AUTO W/ MICRO   6. Benign prostatic hyperplasia with lower urinary tract symptoms, unspecified morphology N40.1 600.01 tamsulosin (FLOMAX) 0.4 mg capsule   7. At risk for falling Z91.81 V15.88 PR FALLS RISK ASSESSMENT DOCUMENTED   8. Screening for depression Z13.89 V79.0     PHQ9 = 6       Plan:   Orders Placed This Encounter   ??? CULTURE, URINE   ??? METABOLIC PANEL, BASIC   ??? LDL, DIRECT   ??? CBC W/O DIFF   ??? HEPATIC FUNCTION PANEL   ??? AMB POC URINALYSIS DIP STICK AUTO W/ MICRO   ??? PR FALLS RISK ASSESSMENT DOCUMENTED   ??? tamsulosin (FLOMAX) 0.4 mg capsule     Sig: Take 1 Cap by mouth daily. Indications: SYMPTOMATIC BENIGN PROSTATIC HYPERPLASIA     Dispense:  30 Cap  Refill:  11       See patient instructions  Patient Instructions        Preventing Falls: Care Instructions  Your Care Instructions  Getting around your home safely can be a challenge if you have injuries or health problems that make it easy for you to fall. Loose rugs and furniture in walkways are among the dangers for many older people who have problems walking or who have poor eyesight. People who have conditions such as arthritis, osteoporosis, or dementia also have to be careful not to fall.  You can make your home safer with a few simple measures.  Follow-up care is a key part of your treatment and safety. Be sure to make and go to all appointments, and call your doctor if you are having problems. It's also a good idea to know your test results and keep a list of the medicines you take.  How can you care for yourself at home?  Taking care of yourself  ?? You may get dizzy if you do not drink enough water. To prevent dehydration, drink plenty of fluids, enough so that your urine is light yellow or clear like water. Choose water and other caffeine-free clear liquids. If you have kidney, heart, or liver disease and have to limit fluids, talk with your doctor before you increase the amount of fluids you drink.  ?? Exercise regularly to improve your strength, muscle tone, and balance. Walk if you can. Swimming may be a good choice if you cannot walk easily.  ?? Have your vision and hearing checked each year or any time you notice a change. If you have trouble seeing and hearing, you might not be able to avoid objects and could lose your balance.   ?? Know the side effects of the medicines you take. Ask your doctor or pharmacist whether the medicines you take can affect your balance. Sleeping pills or sedatives can affect your balance.  ?? Limit the amount of alcohol you drink. Alcohol can impair your balance and other senses.  ?? Ask your doctor whether calluses or corns on your feet need to be removed. If you wear loose-fitting shoes because of calluses or corns, you can lose your balance and fall.  ?? Talk to your doctor if you have numbness in your feet.  Preventing falls at home  ?? Remove raised doorway thresholds, throw rugs, and clutter. Repair loose carpet or raised areas in the floor.  ?? Move furniture and electrical cords to keep them out of walking paths.  ?? Use nonskid floor wax, and wipe up spills right away, especially on ceramic tile floors.  ?? If you use a walker or cane, put rubber tips on it. If you use crutches, clean the bottoms of them regularly with an abrasive pad, such as steel wool.  ?? Keep your house well lit, especially stairways, porches, and outside walkways. Use night-lights in areas such as hallways and bathrooms. Add extra light switches or use remote switches (such as switches that go on or off when you clap your hands) to make it easier to turn lights on if you have to get up during the night.  ?? Install sturdy handrails on stairways.  ?? Move items in your cabinets so that the things you use a lot are on the lower shelves (about waist level).  ?? Keep a cordless phone and a flashlight with new batteries by your bed. If possible, put a phone in each of the main rooms  of your house, or carry a cell phone in case you fall and cannot reach a phone. Or, you can wear a device around your neck or wrist. You push a button that sends a signal for help.  ?? Wear low-heeled shoes that fit well and give your feet good support. Use footwear with nonskid soles. Check the heels and soles of your shoes for  wear. Repair or replace worn heels or soles.  ?? Do not wear socks without shoes on wood floors.  ?? Walk on the grass when the sidewalks are slippery. If you live in an area that gets snow and ice in the winter, sprinkle salt on slippery steps and sidewalks.  Preventing falls in the bath  ?? Install grab bars and nonskid mats inside and outside your shower or tub and near the toilet and sinks.  ?? Use shower chairs and bath benches.  ?? Use a hand-held shower head that will allow you to sit while showering.  ?? Get into a tub or shower by putting the weaker leg in first. Get out of a tub or shower with your strong side first.  ?? Repair loose toilet seats and consider installing a raised toilet seat to make getting on and off the toilet easier.  ?? Keep your bathroom door unlocked while you are in the shower.  Where can you learn more?  Go to [employer].  Enter G117 in the search box to learn more about "Preventing Falls: Care Instructions."  Current as of: September 02, 2014  Content Version: 11.3  ?? 2006-2017 [employer]. Care instructions adapted under license by [employer] (which disclaims liability or warranty for this information). If you have questions about a medical condition or this instruction, always ask your healthcare professional. [employer] disclaims any warranty or liability for your use of this information.          refresh note:  done    AVS Printed:  done

## 2015-09-28 NOTE — Progress Notes (Signed)
Chief Complaint   Patient presents with   ??? Annual Wellness Visit

## 2015-09-29 LAB — CBC W/O DIFF
HCT: 35.8 % — ABNORMAL LOW (ref 37.5–51.0)
HGB: 11.7 g/dL — ABNORMAL LOW (ref 12.6–17.7)
MCH: 31 pg (ref 26.6–33.0)
MCHC: 32.7 g/dL (ref 31.5–35.7)
MCV: 95 fL (ref 79–97)
PLATELET: 263 10*3/uL (ref 150–379)
RBC: 3.78 x10E6/uL — ABNORMAL LOW (ref 4.14–5.80)
RDW: 16.2 % — ABNORMAL HIGH (ref 12.3–15.4)
WBC: 5.5 10*3/uL (ref 3.4–10.8)

## 2015-09-29 LAB — METABOLIC PANEL, BASIC
BUN/Creatinine ratio: 16 (ref 10–24)
BUN: 20 mg/dL (ref 8–27)
CO2: 24 mmol/L (ref 18–29)
Calcium: 9.4 mg/dL (ref 8.6–10.2)
Chloride: 103 mmol/L (ref 96–106)
Creatinine: 1.27 mg/dL (ref 0.76–1.27)
GFR est AA: 58 mL/min/{1.73_m2} — ABNORMAL LOW (ref >59)
GFR est non-AA: 50 mL/min/{1.73_m2} — ABNORMAL LOW (ref >59)
Glucose: 86 mg/dL (ref 65–99)
Potassium: 4.8 mmol/L (ref 3.5–5.2)
Sodium: 142 mmol/L (ref 134–144)

## 2015-09-29 LAB — HEPATIC FUNCTION PANEL
ALT (SGPT): 7 IU/L (ref 0–44)
AST (SGOT): 19 IU/L (ref 0–40)
Albumin: 4.3 g/dL (ref 3.5–4.7)
Alk. phosphatase: 106 IU/L (ref 39–117)
Bilirubin, direct: 0.3 mg/dL (ref 0.00–0.40)
Bilirubin, total: 0.6 mg/dL (ref 0.0–1.2)
Protein, total: 7.6 g/dL (ref 6.0–8.5)

## 2015-09-29 LAB — CKD REPORT

## 2015-09-29 LAB — CULTURE, URINE: Urine Culture, Routine: NO GROWTH

## 2015-09-29 LAB — LDL, DIRECT: LDL,Direct: 65 mg/dL (ref 0–99)

## 2015-10-02 ENCOUNTER — Encounter

## 2015-10-02 MED ORDER — ERGOCALCIFEROL (VITAMIN D2) 50,000 UNIT CAP
1250 mcg (50,000 unit) | ORAL_CAPSULE | ORAL | 2 refills | Status: AC
Start: 2015-10-02 — End: 2016-08-28

## 2015-10-02 NOTE — Progress Notes (Signed)
Your labs are stable.    Take vitamin D    Avoid alcohol    DO NOT FALL

## 2015-10-04 NOTE — Telephone Encounter (Signed)
I called and spoke with patient letting him know that his vitamin D is ready for pick up at the office. Patient verbalized understanding.

## 2015-10-19 ENCOUNTER — Encounter

## 2015-10-24 MED ORDER — TAMSULOSIN SR 0.4 MG 24 HR CAP
0.4 mg | ORAL_CAPSULE | ORAL | 11 refills | Status: DC
Start: 2015-10-24 — End: 2016-10-20

## 2016-07-24 ENCOUNTER — Encounter: Payer: Self-pay | Admitting: Neurology

## 2016-07-26 ENCOUNTER — Other Ambulatory Visit: Payer: Self-pay | Admitting: Nurse Practitioner

## 2016-07-26 DIAGNOSIS — R569 Unspecified convulsions: Secondary | ICD-10-CM

## 2016-07-26 DIAGNOSIS — R531 Weakness: Secondary | ICD-10-CM

## 2016-08-29 ENCOUNTER — Ambulatory Visit
Admission: RE | Admit: 2016-08-29 | Discharge: 2016-08-29 | Disposition: A | Discharge status: Home / Self Care | Payer: Medicare HMO | Source: Ambulatory Visit | Attending: Nurse Practitioner | Admitting: Nurse Practitioner

## 2016-08-29 DIAGNOSIS — R569 Unspecified convulsions: Secondary | ICD-10-CM

## 2016-08-29 DIAGNOSIS — R531 Weakness: Secondary | ICD-10-CM

## 2016-08-29 MED ORDER — GADOBENATE DIMEGLUMINE 529 MG/ML IV SOLN
13.0000 mL | Freq: Once | INTRAVENOUS | Status: AC | PRN
  Administered 2016-08-29: 13 mL via INTRAVENOUS

## 2016-09-18 ENCOUNTER — Ambulatory Visit (INDEPENDENT_AMBULATORY_CARE_PROVIDER_SITE_OTHER): Payer: Medicare HMO | Admitting: Neurology

## 2016-09-18 ENCOUNTER — Encounter: Payer: Self-pay | Admitting: Neurology

## 2016-09-18 VITALS — BP 90/66 | HR 71 | Ht 68.0 in | Wt 135.3 lb

## 2016-09-18 DIAGNOSIS — F0390 Unspecified dementia without behavioral disturbance: Secondary | ICD-10-CM | POA: Diagnosis not present

## 2016-09-18 DIAGNOSIS — F03A0 Unspecified dementia, mild, without behavioral disturbance, psychotic disturbance, mood disturbance, and anxiety: Secondary | ICD-10-CM

## 2016-09-18 DIAGNOSIS — R404 Transient alteration of awareness: Secondary | ICD-10-CM

## 2016-09-18 NOTE — Patient Instructions (Signed)
1. Schedule 1-hour EEG 2. Proceed with Cardiology tests as scheduled 3. Continue close supervision 4. Physical exercise and brain stimulation exercises are important for brain health 5. Follow-up in 6 months

## 2016-09-18 NOTE — Progress Notes (Signed)
NEUROLOGY CONSULTATION NOTE  Justin Juarez MRN: [id number] DOB: February 13, 1924  Referring provider: Zoe Lan, NP Primary care provider: Zoe Lan, NP  Reason for consult:  ?TIA versus seizures  Thank you for your kind referral of Justin Juarez for consultation of the above symptoms. Although his history is well known to you, please allow me to reiterate it for the purpose of our medical record. The patient was accompanied to the clinic by his son who also provides collateral information. Records and images were personally reviewed where available.  HISTORY OF PRESENT ILLNESS: This is a pleasant 83 year old right-handed man with a history of a "weak heart," not on any medications except daily aspirin, presenting for evaluation of recurrent spells of unclear etiology. He had previously been living alone in [place], then moved in with his son last year. Since the beginning of the year, his son reports 4 episodes that wake him up in the middle of the night between 2-4am where he hears his father making a moaning and groaning sound, finding him sitting on the toilet with a look on his face, very hard time verbalizing words, not quite understanding what his son was saying to him. These would last 10-15 minutes, then he slowly starts coming back to his normal self. His son has witnessed 2 episodes in the daytime, occurring 3-5 weeks apart. Last episode was in May/June 2018. With the daytime episode, they were on the couch and patient said he needed to go to the bathroom. He was still sitting then made a gurgle-type noise, he was making mouth movements like trying to get his dentures out, then his face froze and he started to lift his left arm in a flexed position at the elbow. He stared for around 10 seconds, then the episode ended. His son went out of the room briefly, the patient apparently went to stand to go to the bathroom and fell off the couch, scraping his head. The second episode was similar but  less intense, he again said he needed to use the bathroom, then stared off, no left arm flexion seen. He says he can feel them coming on, he describes it as "something inside of me going wild." He is amnestic of the episodes, his son would later tell him what happens. After each episode, his head would be wet from sweat. He denies any palpitations or shortness of breath. His son reports that with the night time events he would always find him sitting on the toilet. One time he thought he made it to the toilet, but was not aware that he had urinated on the floor. He has wet the bed a couple of times, which had upset him. His son has not witnessed any jerking or shaking. Interesting, his son reports that because the episodes seemed to have been related to urination, he has started wearing adult diapers. Since then, he has not had any more spells. He denies any olfactory/gustatory hallucinations, deja vu, rising epigastric sensation, focal numbness/tingling/weakness, myoclonic jerks. He denies any headaches, dizziness, diplopia, dysarthria/dysphagia, neck/back pain, bowel/bladder dysfunction. He has seen Cardiology with plans for a 48-hour holter monitor. His son also wanted to discuss his memory. He reports that his father was not taking care of himself when living alone in [place]. He has never been in charge of bill payments. He does not drive. He would have the same conversation with him a few minutes later. He had a normal birth and early development.  There is no history  of febrile convulsions, CNS infections such as meningitis/encephalitis, significant traumatic brain injury, neurosurgical procedures, or family history of seizures.  I personally reviewed MRI brain with and without contrast which did not show any acute changes. There was advanced cerebral and cerebellar atrophy, and extensive chronic microvascular disease. No hippocampal asymmetry. No abnormal enhancement.  PAST MEDICAL HISTORY: No past  medical history on file.  PAST SURGICAL HISTORY: History reviewed. No pertinent surgical history.  MEDICATIONS:  Outpatient Encounter Prescriptions as of 09/18/2016  Medication Sig  . aspirin EC 81 MG tablet Take 81 mg by mouth daily.   No facility-administered encounter medications on file as of 09/18/2016.    ALLERGIES: No Known Allergies  FAMILY HISTORY: History reviewed. No pertinent family history.  SOCIAL HISTORY: Social History   Social History  . Marital status: Widowed    Spouse name: N/A  . Number of children: N/A  . Years of education: N/A   Occupational History  . Not on file.   Social History Main Topics  . Smoking status: Not on file  . Smokeless tobacco: Not on file  . Alcohol use Not on file  . Drug use: Unknown  . Sexual activity: Not on file   Other Topics Concern  . Not on file   Social History Narrative  . No narrative on file    REVIEW OF SYSTEMS: Constitutional: No fevers, chills, or sweats, no generalized fatigue, change in appetite Eyes: No visual changes, double vision, eye pain Ear, nose and throat: No hearing loss, ear pain, nasal congestion, sore throat Cardiovascular: No chest pain, palpitations Respiratory:  No shortness of breath at rest or with exertion, wheezes GastrointestinaI: No nausea, vomiting, diarrhea, abdominal pain, fecal incontinence Genitourinary:  No dysuria, urinary retention or frequency Musculoskeletal:  No neck pain, back pain Integumentary: No rash, pruritus, skin lesions Neurological: as above Psychiatric: No depression, insomnia, anxiety Endocrine: No palpitations, fatigue, diaphoresis, mood swings, change in appetite, change in weight, increased thirst Hematologic/Lymphatic:  No anemia, purpura, petechiae. Allergic/Immunologic: no itchy/runny eyes, nasal congestion, recent allergic reactions, rashes  PHYSICAL EXAM: Vitals:   09/18/16 0904  BP: 90/66  Pulse: 71  SpO2: (!) 88%   General: No acute  distress Head:  Normocephalic/atraumatic Eyes: Fundoscopic exam shows bilateral sharp discs, no vessel changes, exudates, or hemorrhages Neck: supple, no paraspinal tenderness, full range of motion Back: No paraspinal tenderness Heart: regular rate and rhythm Lungs: Clear to auscultation bilaterally. Vascular: No carotid bruits. Skin/Extremities: No rash, no edema Neurological Exam: Mental status: alert and oriented to person, place, states it is Tuesday, September 16, 2079 (it is 09/18/16, Tues), no dysarthria or aphasia, Fund of knowledge is appropriate.  Remote memory intact. 0/3 delayed recall.  Attention and concentration are normal.    Able to name objects and repeat phrases.  Cranial nerves: CN I: not tested CN II: pupils equal, round and reactive to light, visual fields intact, fundi unremarkable. CN III, IV, VI:  full range of motion, no nystagmus, no ptosis CN V: facial sensation intact CN VII: upper and lower face symmetric CN VIII: hearing intact to finger rub CN IX, X: gag intact, uvula midline CN XI: sternocleidomastoid and trapezius muscles intact CN XII: tongue midline Bulk & Tone: normal, no fasciculations. Motor: 5/5 throughout with no pronator drift. Sensation: intact to light touch, cold, pin, vibration and joint position sense.  No extinction to double simultaneous stimulation.  Romberg test negative Deep Tendon Reflexes: +1 throughout, no ankle clonus Plantar responses: downgoing bilaterally Cerebellar: no  incoordination on finger to nose, heel to shin. No dysdiadochokinesia Gait: narrow-based and steady, able to tandem walk adequately. Tremor: none  IMPRESSION: This is a pleasant 83 year old right-handed man with a history of "weak heart," not on any medications except aspirin, presenting for recurrent spells of altered awareness of unclear etiology. Majority of them have occurred in the commode. His son has witnessed only 2 other episodes that occurred in the living  room, both of which patient reported the urge to urinate. Vasovagal syncope is a consideration, however with report of possible left arm dystonic posturing(?) during one episode and gurgling sounds, seizure is also in the differentials. Interestingly, his son reports that since starting him on adult diapers, he has not had any further spells since May/June 2018. His neurological exam is non-focal. MRI brain no acute changes. A 1-hour EEG will be ordered to assess for focal abnormalities that increase risk for recurrent seizures. Proceed with tests as scheduled by Cardiology. We discussed memory concerns, symptoms suggestive of mild dementia. We agreed on holding off on cholinesterase inhibitors at this point. His son had a discussion about anticoagulation with his cardiologist, he is very hesitant due to patient's age and fall risk. Continue close supervision. We discussed the importance of physical exercise and brain stimulation exercises for brain health. He does not drive. He will follow-up in 6 months and knows to call for any changes.   Thank you for allowing me to participate in the care of this patient. Please do not hesitate to call for any questions or concerns.   Patrcia Dolly, M.D.  CC: Zoe Lan, NP

## 2016-09-20 ENCOUNTER — Other Ambulatory Visit: Payer: Medicare HMO

## 2016-09-20 ENCOUNTER — Ambulatory Visit (INDEPENDENT_AMBULATORY_CARE_PROVIDER_SITE_OTHER): Payer: Medicare HMO | Admitting: Neurology

## 2016-09-20 DIAGNOSIS — R404 Transient alteration of awareness: Secondary | ICD-10-CM

## 2016-09-20 DIAGNOSIS — F03A0 Unspecified dementia, mild, without behavioral disturbance, psychotic disturbance, mood disturbance, and anxiety: Secondary | ICD-10-CM

## 2016-09-20 DIAGNOSIS — F0390 Unspecified dementia without behavioral disturbance: Secondary | ICD-10-CM

## 2016-09-24 DIAGNOSIS — F03A0 Unspecified dementia, mild, without behavioral disturbance, psychotic disturbance, mood disturbance, and anxiety: Secondary | ICD-10-CM | POA: Insufficient documentation

## 2016-09-24 DIAGNOSIS — R404 Transient alteration of awareness: Secondary | ICD-10-CM | POA: Insufficient documentation

## 2016-09-24 DIAGNOSIS — F0390 Unspecified dementia without behavioral disturbance: Secondary | ICD-10-CM | POA: Insufficient documentation

## 2016-09-28 NOTE — Procedures (Signed)
ELECTROENCEPHALOGRAM REPORT  Date of Study: 09/20/2016  Patient's Name: Justin Juarez MRN: [id number] Date of Birth: 02/09/1924  Referring Provider: Dr. Patrcia DollyKaren Rozlyn Yerby  Clinical History: This is a 83 year old man with recurrent spells of decreased awareness/responsiveness.  Medications: aspirin  Technical Summary: A multichannel digital 1-hour EEG recording measured by the international 10-20 system with electrodes applied with paste and impedances below 5000 ohms performed in our laboratory with EKG monitoring in an awake and asleep patient.  Hyperventilation was not performed. Photic stimulation was performed.  The digital EEG was referentially recorded, reformatted, and digitally filtered in a variety of bipolar and referential montages for optimal display.    Description: The patient is awake and asleep during the recording.  During maximal wakefulness, there is a symmetric, medium voltage 8-9 Hz posterior dominant rhythm that attenuates with eye opening.  The record is symmetric.  During drowsiness and stage I sleep, there is an increase in theta slowing of the background, with shifting asymmetry over the bilateral temporal regions, left greater than right. Poorly formed vertex waves were seen. Photic stimulation did not elicit any abnormalities.  There were no epileptiform discharges or electrographic seizures seen.    EKG lead was unremarkable.  Impression: This 1-hour awake and asleep EEG is within normal limits for age.  Clinical Correlation: A normal EEG does not exclude a clinical diagnosis of epilepsy.  If further clinical questions remain, prolonged EEG may be helpful.  Clinical correlation is advised.   Patrcia DollyKaren Kenderick Kobler, M.D.

## 2016-10-02 ENCOUNTER — Telehealth: Payer: Self-pay

## 2016-10-02 NOTE — Telephone Encounter (Signed)
-----   Message from Van ClinesKaren M Aquino, MD sent at 10/02/2016 10:08 AM EDT ----- Pls let son know the brain wave test was normal. thanks

## 2016-10-02 NOTE — Telephone Encounter (Signed)
LMOM relaying message below.  

## 2016-10-20 ENCOUNTER — Emergency Department: Admit: 2016-10-21 | Payer: MEDICARE | Primary: Family Medicine

## 2016-10-20 ENCOUNTER — Observation Stay: Admit: 2016-10-21 | Payer: MEDICARE | Primary: Family Medicine

## 2016-10-20 DIAGNOSIS — R55 Syncope and collapse: Secondary | ICD-10-CM

## 2016-10-20 NOTE — Progress Notes (Signed)
[facility]: MED RECONCILIATION    Comments/Recommendations:   The patient is a poor historian as to his medications. His son assists in the interview. The patient and son can clearly report the patient is taking aspirin 81 mg daily. They believe he also takes vitamin D 1000 units daily and folic acid 400 mcg daily. The patient used to take Thiamine 100 mg daily but they do not believe he has taken this medication recently.    Medications added:     ?? None    Medications removed:    ?? thiamine    Medications adjusted:    ?? none    Allergies: Review of patient's allergies indicates no known allergies.    Prior to Admission Medications:   Prior to Admission Medications   Prescriptions Last Dose Informant Patient Reported? Taking?   aspirin 81 mg chewable tablet 10/20/2016 at am Self No Yes   Sig: Take 1 Tab by mouth daily.   cholecalciferol (VITAMIN D3) 1,000 unit tablet 10/20/2016 at am Self No Yes   Sig: Take 1 tablet by mouth daily.   folic acid 400 mcg tablet 10/20/2016 at am Self No Yes   Sig: Take 1 tablet by mouth daily.      Facility-Administered Medications: None      Thank you,      Shawnie Pons, PharmD, BCPS

## 2016-10-20 NOTE — ED Provider Notes (Signed)
HPI Comments: 83 y.o. male with past medical history significant for MI, cardiomyopathy, arrhythmia, and heart murmur who presents via EMS from home accompanied by his son with chief complaint of syncope. Patient was out to dinner shortly prior to arrival tonight (10/20/16) when he experienced a syncopal episode after standing and attempting to ambulate. Patient sustained GLF in which he hit his head, but denies pain upon arrival. Patient reports mild dizziness. Per EMS, BP 85 en route. O2 normal. Small skin tear noted on left arm from fall. Per patient's son, patient has Hx of similar episodes approx. every 6-8 weeks. Patient has visited a neurologist for the same with normal results. Patient's son witnessed previous "shaking" episode in which patient exhibited uncontrollable movement of all extremities for approx. 45 minutes. Denies syncope or fall at that time. Patient is not anticoagulated.    There are no other acute medical concerns at this time.    Social hx: Former tobacco smoker (1ppd, quit 07/22/1992); Endorses occasional EtOH use; Denies illicit drug use  PCP: Jennye Boroughs, MD    Note written by Shelly Bombard, Scribe, as dictated by Jorje Guild, MD 8:24 PM    The history is provided by the patient, the EMS personnel and a relative. No language interpreter was used.        Past Medical History:   Diagnosis Date   ??? Alcohol abuse    ??? Arrhythmia     A-Fib   ??? Arthritis     osteoporosis   ??? Cancer (HCC)     skin cancer basal cell   ??? Cardiomyopathy (HCC) 09/11/2012    A.  Echo (09/11/12):  EF 35-40% with mod GHK.  Mildly dil RV.  Mod dil RA/LA.  Mild MR.  Mod-severe TR.  Mod PR.  PASP 60.   ??? Hearing aid worn 08/3015   ??? Heart murmur    ??? Ill-defined condition     seasonal allergies., fatty liver, alcoholism   ??? Ill-defined condition     a-fib   ??? Liver disease     fatty liver   ??? MI (myocardial infarction) (HCC)     states 40 years ago   ??? Reynolds syndrome [facility])        Past Surgical History:    Procedure Laterality Date   ??? HX ORTHOPAEDIC  1960s    index finger laceration repaired         Family History:   Problem Relation Age of Onset   ??? Lung Disease Mother    ??? Cancer Father        Social History     Social History   ??? Marital status: WIDOWED     Spouse name: N/A   ??? Number of children: N/A   ??? Years of education: N/A     Occupational History   ??? Not on file.     Social History Main Topics   ??? Smoking status: Former Smoker     Packs/day: 1.00     Years: 50.00     Quit date: 07/22/1992   ??? Smokeless tobacco: Never Used   ??? Alcohol use 0.0 oz/week      Comment: States he drinks 2 small glasses of Vodka a week   ??? Drug use: No   ??? Sexual activity: Not on file     Other Topics Concern   ??? Not on file     Social History Narrative  ALLERGIES: Review of patient's allergies indicates no known allergies.    Review of Systems   Neurological: Positive for dizziness and syncope.   All other systems reviewed and are negative.      Vitals:    10/20/16 2014   BP: 173/90   Pulse: 92   Resp: 20   Temp: 97.8 ??F (36.6 ??C)   SpO2: 96%   Weight: 61.2 kg (135 lb)   Height: 5\' 8"  (1.727 m)            Physical Exam   Constitutional: He appears well-developed and well-nourished. No distress.   HENT:   Head: Normocephalic and atraumatic.   Mouth/Throat: Oropharynx is clear and moist.   Eyes: Conjunctivae and EOM are normal.   Neck: Normal range of motion and phonation normal.   Cardiovascular: Normal rate and intact distal pulses.  An irregularly irregular rhythm present.   Pulmonary/Chest: Effort normal. No respiratory distress. He has no wheezes. He has no rales.   Abdominal: He exhibits no distension.   Musculoskeletal: Normal range of motion. He exhibits no tenderness.   Neurological: He is alert. He is not disoriented. He exhibits normal muscle tone.   Skin: Skin is warm and dry. He is not diaphoretic.   Nursing note and vitals reviewed.    ED EKG interpretation:   Rhythm: atrial fib and RBBB; and irregular. Rate (approx.): 81; Axis: left axis deviation; P wave: irregular; QRS interval: prolonged; ST/T wave: non-specific changes; in  Lead: aVL ; Other findings: abnormal ekg. This EKG was interpreted by Jorje Guild, MD,ED Provider.    MDM    Syncopal episode.  History of multiple prior, unclear etiology.  Currently [place] cardiologist[place].  Check CBC, CMP, Trop, UA, Cardiac monitor,  CT head  ED Course     9:45pm  Discussed with hospitalist, will admit  Procedures

## 2016-10-20 NOTE — ED Triage Notes (Signed)
Out with family at dinner. Attempted to get into car and fell. No c/o pain or injury at this time. Has skin tear on left arm from fall today. Wears a heart monitor.

## 2016-10-20 NOTE — H&P (Signed)
[facility]  [address] [place], [place] [address]  [phone]    Admission History and Physical      NAME:  Dylan Haney   DOB:   1924-04-07   MRN:  [id number]     PCP:  Sol Blazing, MD  (in [place])    Date/Time:  10/20/2016         Subjective:     CHIEF COMPLAINT: syncope     HISTORY OF PRESENT ILLNESS:     The patient is a 83 yo hx of chronic afib, CM EF 35%, fatty liver, presented w/ acute on chronic syncope.  The patient is a poor historian.  His son stated that the patient has had intermittent syncopal episode for 9 months.  Tonight, the patient had a syncopal episode w/ LOC for several minutes.  His grandson, who's in medical school, thought that the patient was hyperventilating and possibly lost his pulse briefly.  Post syncope, the patient was confused for about 1 hour.  He has had these episodes about every 4-6 weeks.  The patient was seen by a Neurologist and Cardiologist in [place]; and has had a negative head MRI and EEG (per son).  The patient currently wears a cardiac monitor.  In the ED, head CT was neg.  CXR with mild edema.  Trop neg.  Tele showing HR ~140s, but EKG and manual pulse check more consistent with HR ~75, irregular.      No Known Allergies    Prior to Admission medications    Medication Sig Start Date End Date Taking? Authorizing Provider   folic acid 400 mcg tablet Take 1 tablet by mouth daily. 11/05/12  Yes Jennye Boroughs, MD   cholecalciferol (VITAMIN D3) 1,000 unit tablet Take 1 tablet by mouth daily. 11/05/12  Yes Jennye Boroughs, MD   aspirin 81 mg chewable tablet Take 1 Tab by mouth daily. 11/27/11  Yes Jennye Boroughs, MD       Past Medical History:   Diagnosis Date   ??? Alcohol abuse    ??? Arrhythmia     A-Fib   ??? Arthritis     osteoporosis   ??? Cancer (HCC)     skin cancer basal cell   ??? Cardiomyopathy (HCC) 09/11/2012    A.  Echo (09/11/12):  EF 35-40% with mod GHK.  Mildly dil RV.  Mod dil RA/LA.  Mild MR.  Mod-severe TR.  Mod PR.  PASP 60.    ??? Hearing aid worn 08/3015   ??? Heart murmur    ??? Ill-defined condition     seasonal allergies., fatty liver, alcoholism   ??? Ill-defined condition     a-fib   ??? Liver disease     fatty liver   ??? MI (myocardial infarction) (HCC)     states 40 years ago   ??? Reynolds syndrome [facility])         Past Surgical History:   Procedure Laterality Date   ??? HX ORTHOPAEDIC  1960s    index finger laceration repaired       Social History   Substance Use Topics   ??? Smoking status: Former Smoker     Packs/day: 1.00     Years: 50.00     Quit date: 07/22/1992   ??? Smokeless tobacco: Never Used   ??? Alcohol use 0.0 oz/week      Comment: States he drinks 2 small glasses of Vodka a week  Family History   Problem Relation Age of Onset   ??? Lung Disease Mother    ??? Cancer Father         Review of Systems:  (bold if positive, if negative)    Gen:  Eyes:  ENT:  CVS:  , dizziness, syncopePulm:  GI:    GU:    MS:  Skin:  Psych:  Endo:    Hem:  Renal:    Neuro:          Objective:      VITALS:    Vital signs reviewed; most recent are:    Visit Vitals   ??? BP 146/75   ??? Pulse (!) 149   ??? Temp 97.8 ??F (36.6 ??C)   ??? Resp 20   ??? Ht  (1.727 m)   ??? Wt 61.2 kg (135 lb)   ??? SpO2 99%   ??? BMI 20.53 kg/m2     SpO2 Readings from Last 6 Encounters:   10/20/16 99%   09/28/15 97%   06/01/15 96%   05/30/15 97%   05/27/15 96%   05/25/15 96%        No intake or output data in the 24 hours ending 10/20/16 2232     Exam:     Physical Exam:    Gen:  Elderly, thin, frail, NAD  HEENT:  Pink conjunctivae, PERRL, hearing intact to voice, dry mucous membranes  Neck:  Supple, without masses, thyroid non-tender  Resp:  No accessory muscle use, rales at bases  Card:  3/6 murmurs, normal S1, S2 without thrills, bruits or peripheral edema  Abd:  Soft, non-tender, non-distended, normoactive bowel sounds are present  Lymph:  No cervical adenopathy  Musc:  No cyanosis or clubbing  Skin:  No rashes   Neuro:  Cranial nerves 3-12 are grossly intact, grip strength is 5/5  bilaterally, dorsi / plantarflexion strength is 5/5 bilaterally, follows commands appropriately  Psych:  Alert with poor insight.  Oriented to person, place, and time    Labs:    Recent Labs      10/20/16   2022   WBC  7.5   HGB  11.6*   HCT  37.1   PLT  230     Recent Labs      10/20/16   2022   NA  138   K  4.7   CL  103   CO2  26   GLU  102*   BUN  30*   CREA  1.27   CA  9.0   MG  1.9   ALB  3.4*   TBILI  0.6   SGOT  33   ALT  25     Lab Results   Component Value Date/Time    Glucose (POC) 100 09/12/2012 11:49 PM    Glucose (POC) 95 09/12/2012 09:05 PM     No results for input(s): PH, PCO2, PO2, HCO3, FIO2 in the last 72 hours.  Recent Labs      10/20/16   2022   INR  1.1       Radiology and EKG reviewed:   Head CT neg    **Old Records reviewed in [facility]**       Assessment/Plan:       Principal Problem:    83 yo hx of chronic afib, CM EF 35%, fatty liver, presented w/ acute on chronic syncope    1) Syncope and collapse: unclear etiology.  Acute on chronic,  has been occurring intermittently for 9 months.  Concern for undiagnosed seizures vs cardiac arrhythmias.  Less likely vasal-vago or dehydration.  Patient's son reported that he's being managed by a Neurologist and Cardiologist in [place], and has had a negative EEG/head MRI.  Head CT here unremarkable.  Will monitor on Tele.  Recheck cardiac enzymes, TSH, ammonia, lactate, echo, carotid dopplers.  Given worsening episode tonight, will obtain a chest CT to r/o PE.  Consult Cards, Neuro    2) Chronic afib: manual HR ~75, also consistent on EKG, however, monitor showing HR ~140-160.  Not on rate control agents.  Not on anticoagulation due to falls.  Will defer to Cards, repeating echo    3) Mild pulm edema/pleural effusion/hx of CM: last EF 5 years ago was 35%.  No symptoms of heart failure.  Will check echo, pro BNP    4) Fatty liver: LFTs wnl.  Will monitor    5) Frequent falls: due to above.  Will consult PT/OT    Code: Full - discussed with family, patient     Risk of deterioration: high      Total time spent with patient: 49 Minutes                  Care Plan discussed with: Patient, son, nursing    Discussed:  Care Plan    Prophylaxis:  Hep SQ    Probable Disposition:  HH PT, OT, RN           ___________________________________________________    Attending Physician: Dayna Barker, MD

## 2016-10-20 NOTE — ACP (Advance Care Planning) (Signed)
Advance Care Planning (ACP) Provider Note - Comprehensive     Date of ACP Conversation: 10/20/16  Persons included in Conversation:  patient and family  Length of ACP Conversation in minutes:  16 minutes    Authorized Management consultantDecision Maker (if patient is incapable of making informed decisions):   This person is:  Son          General ACP for ALL Patients with Decision Making Capacity:   Exploration of values, goals, and preferences if recovery is not expected, even with continued medical treatment in the event of: Imminent death    Review of Existing Advance Directive:  no document    For Serious or Chronic Illness:  Understanding of CPR, goals and expected outcomes, benefits and burdens discussed.    Interventions Provided:  Recommended completion of Advance Directive form after review of ACP materials and conversation with prospective healthcare agent

## 2016-10-20 NOTE — ED Notes (Signed)
Placed patient on hospital bed for comfort

## 2016-10-20 NOTE — Consults (Signed)
See admission note

## 2016-10-21 ENCOUNTER — Inpatient Hospital Stay
Admit: 2016-10-21 | Discharge: 2016-10-21 | Disposition: A | Discharge status: Home / Self Care | Payer: MEDICARE | Attending: Emergency Medicine

## 2016-10-21 LAB — METABOLIC PANEL, BASIC
Anion gap: 10 mmol/L (ref 5–15)
BUN/Creatinine ratio: 21 — ABNORMAL HIGH (ref 12–20)
BUN: 24 MG/DL — ABNORMAL HIGH (ref 6–20)
CO2: 24 mmol/L (ref 21–32)
Calcium: 8.7 MG/DL (ref 8.5–10.1)
Chloride: 102 mmol/L (ref 97–108)
Creatinine: 1.12 MG/DL (ref 0.70–1.30)
GFR est AA: >60 mL/min/{1.73_m2} (ref >60)
GFR est non-AA: >60 mL/min/{1.73_m2} (ref >60)
Glucose: 84 mg/dL (ref 65–100)
Potassium: 5.6 mmol/L — ABNORMAL HIGH (ref 3.5–5.1)
Sodium: 136 mmol/L (ref 136–145)

## 2016-10-21 LAB — METABOLIC PANEL, COMPREHENSIVE
A-G Ratio: 0.8 — ABNORMAL LOW (ref 1.1–2.2)
ALT (SGPT): 25 U/L (ref 12–78)
AST (SGOT): 33 U/L (ref 15–37)
Albumin: 3.4 g/dL — ABNORMAL LOW (ref 3.5–5.0)
Alk. phosphatase: 142 U/L — ABNORMAL HIGH (ref 45–117)
Anion gap: 9 mmol/L (ref 5–15)
BUN/Creatinine ratio: 24 — ABNORMAL HIGH (ref 12–20)
BUN: 30 MG/DL — ABNORMAL HIGH (ref 6–20)
Bilirubin, total: 0.6 MG/DL (ref 0.2–1.0)
CO2: 26 mmol/L (ref 21–32)
Calcium: 9 MG/DL (ref 8.5–10.1)
Chloride: 103 mmol/L (ref 97–108)
Creatinine: 1.27 MG/DL (ref 0.70–1.30)
GFR est AA: >60 mL/min/{1.73_m2} (ref >60)
GFR est non-AA: 53 mL/min/{1.73_m2} — ABNORMAL LOW (ref >60)
Globulin: 4.5 g/dL — ABNORMAL HIGH (ref 2.0–4.0)
Glucose: 102 mg/dL — ABNORMAL HIGH (ref 65–100)
Potassium: 4.7 mmol/L (ref 3.5–5.1)
Protein, total: 7.9 g/dL (ref 6.4–8.2)
Sodium: 138 mmol/L (ref 136–145)

## 2016-10-21 LAB — URINALYSIS W/MICROSCOPIC
Bacteria: NEGATIVE /hpf
Bilirubin: NEGATIVE
Blood: NEGATIVE
Glucose: NEGATIVE mg/dL
Ketone: NEGATIVE mg/dL
Leukocyte Esterase: NEGATIVE
Nitrites: NEGATIVE
Protein: 30 mg/dL — AB
Specific gravity: 1.018 (ref 1.003–1.030)
Urobilinogen: 2 EU/dL — ABNORMAL HIGH (ref 0.2–1.0)
pH (UA): 5.5 (ref 5.0–8.0)

## 2016-10-21 LAB — HEMOGLOBIN A1C WITH EAG
Est. average glucose: 126 mg/dL
Hemoglobin A1c: 6 % (ref 4.2–6.3)

## 2016-10-21 LAB — CBC WITH AUTOMATED DIFF
ABS. BASOPHILS: 0 10*3/uL (ref 0.0–0.1)
ABS. EOSINOPHILS: 0.1 10*3/uL (ref 0.0–0.4)
ABS. IMM. GRANS.: 0 10*3/uL (ref 0.00–0.04)
ABS. LYMPHOCYTES: 1.8 10*3/uL (ref 0.8–3.5)
ABS. MONOCYTES: 0.6 10*3/uL (ref 0.0–1.0)
ABS. NEUTROPHILS: 5 10*3/uL (ref 1.8–8.0)
ABSOLUTE NRBC: 0 10*3/uL (ref 0.00–0.01)
BASOPHILS: 0 % (ref 0–1)
EOSINOPHILS: 2 % (ref 0–7)
HCT: 37.1 % (ref 36.6–50.3)
HGB: 11.6 g/dL — ABNORMAL LOW (ref 12.1–17.0)
IMMATURE GRANULOCYTES: 0 % (ref 0.0–0.5)
LYMPHOCYTES: 24 % (ref 12–49)
MCH: 30.2 PG (ref 26.0–34.0)
MCHC: 31.3 g/dL (ref 30.0–36.5)
MCV: 96.6 FL (ref 80.0–99.0)
MONOCYTES: 8 % (ref 5–13)
MPV: 9.6 FL (ref 8.9–12.9)
NEUTROPHILS: 67 % (ref 32–75)
NRBC: 0 PER 100 WBC
PLATELET: 230 10*3/uL (ref 150–400)
RBC: 3.84 M/uL — ABNORMAL LOW (ref 4.10–5.70)
RDW: 15 % — ABNORMAL HIGH (ref 11.5–14.5)
WBC: 7.5 10*3/uL (ref 4.1–11.1)

## 2016-10-21 LAB — POTASSIUM: Potassium: 4.6 mmol/L (ref 3.5–5.1)

## 2016-10-21 LAB — AMMONIA: Ammonia, plasma: 17 umol/L (ref <32)

## 2016-10-21 LAB — HEPATIC FUNCTION PANEL
A-G Ratio: 0.7 — ABNORMAL LOW (ref 1.1–2.2)
ALT (SGPT): 24 U/L (ref 12–78)
AST (SGOT): 39 U/L — ABNORMAL HIGH (ref 15–37)
Albumin: 3.1 g/dL — ABNORMAL LOW (ref 3.5–5.0)
Alk. phosphatase: 130 U/L — ABNORMAL HIGH (ref 45–117)
Bilirubin, total: 0.8 MG/DL (ref 0.2–1.0)
Globulin: 4.5 g/dL — ABNORMAL HIGH (ref 2.0–4.0)
Protein, total: 7.6 g/dL (ref 6.4–8.2)

## 2016-10-21 LAB — CBC W/O DIFF
ABSOLUTE NRBC: 0 10*3/uL (ref 0.00–0.01)
HCT: 36.6 % (ref 36.6–50.3)
HGB: 11.6 g/dL — ABNORMAL LOW (ref 12.1–17.0)
MCH: 30.4 PG (ref 26.0–34.0)
MCHC: 31.7 g/dL (ref 30.0–36.5)
MCV: 95.8 FL (ref 80.0–99.0)
MPV: 9.6 FL (ref 8.9–12.9)
NRBC: 0 PER 100 WBC
PLATELET: 226 10*3/uL (ref 150–400)
RBC: 3.82 M/uL — ABNORMAL LOW (ref 4.10–5.70)
RDW: 15 % — ABNORMAL HIGH (ref 11.5–14.5)
WBC: 6.5 10*3/uL (ref 4.1–11.1)

## 2016-10-21 LAB — PROTHROMBIN TIME + INR
INR: 1.1 (ref 0.9–1.1)
Prothrombin time: 11.4 s — ABNORMAL HIGH (ref 9.0–11.1)

## 2016-10-21 LAB — LIPID PANEL
CHOL/HDL Ratio: 2.3 (ref 0–5.0)
Cholesterol, total: 106 MG/DL (ref <200)
HDL Cholesterol: 46 MG/DL
LDL, calculated: 49.6 MG/DL (ref 0–100)
Triglyceride: 52 MG/DL (ref <150)
VLDL, calculated: 10.4 MG/DL

## 2016-10-21 LAB — TSH 3RD GENERATION: TSH: 1.48 u[IU]/mL (ref 0.36–3.74)

## 2016-10-21 LAB — SAMPLES BEING HELD

## 2016-10-21 LAB — LACTIC ACID: Lactic acid: 1.4 MMOL/L (ref 0.4–2.0)

## 2016-10-21 LAB — CK W/ CKMB & INDEX
CK - MB: 1.5 NG/ML (ref <3.6)
CK-MB Index: 1.4 (ref 0–2.5)
CK: 104 U/L (ref 39–308)

## 2016-10-21 LAB — NT-PRO BNP: NT pro-BNP: 3797 PG/ML — ABNORMAL HIGH (ref 0–450)

## 2016-10-21 LAB — TROPONIN I
Troponin-I, Qt.: <0.05 ng/mL (ref <0.05)
Troponin-I, Qt.: <0.05 ng/mL (ref <0.05)

## 2016-10-21 LAB — PTT: aPTT: 29.6 s (ref 22.1–32.0)

## 2016-10-21 LAB — URINE CULTURE HOLD SAMPLE

## 2016-10-21 LAB — MAGNESIUM
Magnesium: 1.9 mg/dL (ref 1.6–2.4)
Magnesium: 1.8 mg/dL (ref 1.6–2.4)

## 2016-10-21 LAB — PHOSPHORUS: Phosphorus: 3.8 MG/DL (ref 2.6–4.7)

## 2016-10-21 MED ORDER — SODIUM CHLORIDE 0.9 % IJ SYRG
INTRAMUSCULAR | Status: DC | PRN
Start: 2016-10-21 — End: 2016-10-21

## 2016-10-21 MED ORDER — ACETAMINOPHEN 325 MG TABLET
325 mg | ORAL | Status: DC | PRN
Start: 2016-10-21 — End: 2016-10-21

## 2016-10-21 MED ORDER — ONDANSETRON (PF) 4 MG/2 ML INJECTION
4 mg/2 mL | Freq: Four times a day (QID) | INTRAMUSCULAR | Status: DC | PRN
Start: 2016-10-21 — End: 2016-10-21

## 2016-10-21 MED ORDER — DOCUSATE SODIUM 100 MG CAP
100 mg | Freq: Two times a day (BID) | ORAL | Status: DC
Start: 2016-10-21 — End: 2016-10-21
  Administered 2016-10-21: 13:00:00 via ORAL

## 2016-10-21 MED ORDER — DIPHENHYDRAMINE HCL 50 MG/ML IJ SOLN
50 mg/mL | INTRAMUSCULAR | Status: DC | PRN
Start: 2016-10-21 — End: 2016-10-21

## 2016-10-21 MED ORDER — NALOXONE 0.4 MG/ML INJECTION
0.4 mg/mL | INTRAMUSCULAR | Status: DC | PRN
Start: 2016-10-21 — End: 2016-10-21

## 2016-10-21 MED ORDER — SODIUM CHLORIDE 0.9 % IV
INTRAVENOUS | Status: DC
Start: 2016-10-21 — End: 2016-10-21
  Administered 2016-10-21 (×3): via INTRAVENOUS

## 2016-10-21 MED ORDER — IOPAMIDOL 76 % IV SOLN
370 mg iodine /mL (76 %) | Freq: Once | INTRAVENOUS | Status: AC
Start: 2016-10-21 — End: 2016-10-20
  Administered 2016-10-21: 03:00:00 via INTRAVENOUS

## 2016-10-21 MED ORDER — ASPIRIN 81 MG CHEWABLE TAB
81 mg | Freq: Every day | ORAL | Status: DC
Start: 2016-10-21 — End: 2016-10-21
  Administered 2016-10-21: 13:00:00 via ORAL

## 2016-10-21 MED ORDER — FOLIC ACID 0.4 MG TAB
400 mcg | Freq: Every day | ORAL | Status: DC
Start: 2016-10-21 — End: 2016-10-21
  Administered 2016-10-21: 15:00:00 via ORAL

## 2016-10-21 MED ORDER — SODIUM CHLORIDE 0.9 % IJ SYRG
Freq: Three times a day (TID) | INTRAMUSCULAR | Status: DC
Start: 2016-10-21 — End: 2016-10-21
  Administered 2016-10-21 (×2): via INTRAVENOUS

## 2016-10-21 MED ORDER — CHOLECALCIFEROL (VITAMIN D3) 1,000 UNIT (25 MCG) TAB
Freq: Every day | ORAL | Status: DC
Start: 2016-10-21 — End: 2016-10-21
  Administered 2016-10-21: 13:00:00 via ORAL

## 2016-10-21 MED ORDER — HEPARIN (PORCINE) 5,000 UNIT/ML IJ SOLN
5000 unit/mL | Freq: Three times a day (TID) | INTRAMUSCULAR | Status: DC
Start: 2016-10-21 — End: 2016-10-21
  Administered 2016-10-21 (×2): via SUBCUTANEOUS

## 2016-10-21 MED FILL — VITAMIN D3 25 MCG (1,000 UNIT) TABLET: 25 mcg (1,000 unit) | ORAL | Qty: 1

## 2016-10-21 MED FILL — ISOVUE-370  76 % INTRAVENOUS SOLUTION: 370 mg iodine /mL (76 %) | INTRAVENOUS | Qty: 100

## 2016-10-21 MED FILL — BD POSIFLUSH NORMAL SALINE 0.9 % INJECTION SYRINGE: INTRAMUSCULAR | Qty: 10

## 2016-10-21 MED FILL — FOLIC ACID 0.4 MG TAB: 400 mcg | ORAL | Qty: 1

## 2016-10-21 MED FILL — SODIUM CHLORIDE 0.9 % IV: INTRAVENOUS | Qty: 250

## 2016-10-21 MED FILL — DOK 100 MG CAPSULE: 100 mg | ORAL | Qty: 1

## 2016-10-21 MED FILL — HEPARIN (PORCINE) 5,000 UNIT/ML IJ SOLN: 5000 unit/mL | INTRAMUSCULAR | Qty: 1

## 2016-10-21 MED FILL — ASPIRIN 81 MG CHEWABLE TAB: 81 mg | ORAL | Qty: 1

## 2016-10-21 NOTE — ED Notes (Signed)
Neurologist at bedside evaluating pt.  Dr. Irving Burton, Cardiologist, in dept to evaluate pt.

## 2016-10-21 NOTE — ED Notes (Signed)
Dr. Verdie MosherLiu at bedside updating pt/pt's family.

## 2016-10-21 NOTE — ED Notes (Addendum)
Neuro provider, Dr. Esmond Plants, consulted and updated on pt.  Will evaluate pt per order.

## 2016-10-21 NOTE — Discharge Summary (Signed)
Physician Discharge Summary     Patient ID:  Dylan Haney  [id number]  83 y.o.  Jun 29, 1924    Admit date: 10/20/2016    Discharge date: 10/21/2016    Admission Diagnoses: Syncope and collapse    Principal Discharge Diagnoses:    syncope    OTHER PROBLEMS ADDRESSEDS  Principal Diagnosis Syncope and collapse                                            Principal Problem:    Syncope and collapse (10/20/2016)    Active Problems:    Atrial fibrillation (HCC) (11/27/2011)      Cardiomyopathy (HCC) (09/11/2012)      Overview: Dr. Elmon Kirschner      A.  Echo (09/11/12):  EF 35-40% with mod GHK.  Mildly dil RV.  Mod dil       RA/LA.  Mild MR.  Mod-severe TR.  Mod PR.  PASP 60.      B.  Lexiscan Cardiolite (09/12/12):  Normal perfusion, EF 43% with mod inf       HK and septal AK vs PSM.      CLynder Parents Cardiolite (04/15/14):  Normal perfusion.  EF 39% with PSM and       GHK.      Frequent falls (10/20/2016)       Patient Active Problem List   Diagnosis Code   ??? Falls [email or web address].Lorne Skeens   ??? ETOH abuse F10.10   ??? Atrial fibrillation (HCC) I48.91   ??? Elevated bilirubin R17   ??? Unspecified vitamin D deficiency E55.9   ??? Macrocytosis D75.89   ??? NASH (nonalcoholic steatohepatitis) K75.81   ??? Cardiomyopathy (HCC) I42.9   ??? Health care maintenance Z00.00   ??? Advance directive discussed with patient Z71.89   ??? Healthcare maintenance Z00.00   ??? Syncope and collapse R55   ??? Frequent falls R29.6         Hospital Course:   Mr. Hreha is a pleasant 83 yo M w/ hx of ETOH abuse, cardiomyopathy presenting with recurrent syncope    Syncope and collapse: unclear etiology. Recurrent over the past 9 months. Ddx cardiogenic (arrhythmias, has a-fib, cardiomyopathy so at risk for VT/VF) vs less likely neurogenic (?undiagnosed seizure). No orthostatic hypotension. CTA negative for PE. Per report/son, has had negative MRI brain, EEG. Currently wearing ?Holter monitor set up by his cardiologist in [place]. Case discussed at length with cardiologist and family, and pt's son  feels strongly about taking pt home to [facility] so his cardiologist there (Dr. Dot Been) can review his Holter in the morning. Reviewed risk and benefit of further observation vs discharge and return to [facility] tonight, pt and family wish to take him home. Discussed with Dr. Irving Burton, and he felt it was okay for pt to be discharged. TTE not reported yet, but per Dr. Irving Burton, showing reduced EF, with AR and TR. Nothing alarming on telemetry per cardiology. Advised that pt needs close follow up with cards in [place]. Will send DC summary to Dr. Rusty Aus office  ??   Chronic afib: Manual HR ~60-70s, also consistent on EKG, however, monitor had shown HR ~140-160. ??Not on rate control agents. ??Not on anticoagulation due to falls. Deferring mgmt to primary cardiology. Echo repeated as above  ????   Mild pulm edema/pleural effusion/hx of CM: CT chest showed evidence of right heart failure,  mild interstitial edema  and small to moderate pleural effusion. Echo as above. No diuresis for now  ????   Fatty liver: LFTs wnl. ??Will monitor  ????   Frequent falls: due to above. ??PT recommends HH[place], [place] it arranged there  ??    Pt discharged in improved and stable condition.     Procedures performed: see above    Imaging studies: see above      PCP: Phys Other, MD    Consults: Cardiology and Neurology    Discharge Exam:  Patient Vitals for the past 12 hrs:   Pulse Resp BP SpO2   10/21/16 1400 63 18 113/47 98 %   10/21/16 1330 63 20 138/55 99 %   10/21/16 1315 65 17 133/70 98 %   10/21/16 1300 65 21 140/58 98 %   10/21/16 1230 73 20 143/64 -   10/21/16 1200 66 21 134/86 100 %   10/21/16 1130 63 23 149/72 100 %   10/21/16 1116 68 - 132/75 98 %   10/21/16 1111 71 - 128/61 -   10/21/16 1100 70 17 142/76 99 %   10/21/16 1045 65 21 152/68 100 %   10/21/16 1015 66 24 137/71 95 %   10/21/16 0945 63 (!) 33 132/66 97 %   10/21/16 0915 64 22 146/69 95 %   10/21/16 0830 77 24 140/72 96 %   10/21/16 0800 61 20 145/59 95 %    10/21/16 0730 61 20 147/74 95 %       See exam findings from today    Disposition: home    Patient Instructions:   Current Discharge Medication List      CONTINUE these medications which have NOT CHANGED    Details   folic acid 400 mcg tablet Take 1 tablet by mouth daily.  Qty: 30 tablet, Refills: 11    Associated Diagnoses: Atrial fibrillation (HCC); Cardiomyopathy (HCC); ETOH abuse      cholecalciferol (VITAMIN D3) 1,000 unit tablet Take 1 tablet by mouth daily.  Qty: 30 tablet, Refills: 11    Associated Diagnoses: ETOH abuse      aspirin 81 mg chewable tablet Take 1 Tab by mouth daily.  Qty: 90 Tab, Refills: 3    Associated Diagnoses: A-fib (HCC); Murmur, cardiac             Activity: See discharge instructions  Diet: See discharge instructions  Wound Care: See discharge instructions    Follow-up Information     Follow up With Details Comments Contact Info    Follow up with Dr. Dot Beenohrbeck tomorrow             I spent 45 minutes on this discharge.    Signed:  Jamie BrookesMichael Etana Beets, MD  10/21/2016  3:44 PM

## 2016-10-21 NOTE — ED Notes (Signed)
Pt discharged by this RN and denies further questions/concerns at this time.  Pt's grandson present during D/C instructions and denies further questions/concerns as well.  Pt will f/u with PCP as directed.  Pt escorted off the unit with a w/c and in NAD.  Home with family who will be driving.

## 2016-10-21 NOTE — ED Notes (Signed)
PT at bedside evaluating pt/speaking with pt's son.

## 2016-10-21 NOTE — Progress Notes (Signed)
Occupational Therapy EVALUATION/discharge  Patient: Dylan Haney (83 y.o. male)  Date: 10/21/2016  Primary Diagnosis: Syncope and collapse        Precautions:  Fall    ASSESSMENT:   Cleared by RN to see pt for therapy session. Based on the objective data described below, the patient presents near baseline level of function with mildly decreased endurance following admission for syncope. Pt lives with his son in [place] (currently in [place] visiting family), I with ADLs and ambulatory with SPC at baseline. Pt experienced syncope and shaking while up to commode at night. CT negative for acute processes. Today pt received supine in bed, agreeable to participate. Pt alert and oriented x4 although needing occasional redirection to task. Bed mobility and sit>stand performed with modified independence, no LOB observed and pt with no c/o dizziness. Difficulty obtaining vitals as BP did not register on monitor with several attempts, but pre-session BP 134/86 and BP during session 128/85, HR and SpO2 stable. Pt able to sit EOB and don/doff socks without assistance. Returned to supine in bed at end of session. Pt and several family members present report that he is near his functional baseline with decreased endurance. Feel that pt would benefit from home health therapy at discharge to improve strength and endurance.  Further skilled acute occupational therapy is not indicated at this time.  Discharge Recommendations: Home Health  Further Equipment Recommendations for Discharge: bedside commode, pt's family looking into using [facility] for toileting at night for safety     SUBJECTIVE:   Patient stated ???Want to go for a run?.???    OBJECTIVE DATA SUMMARY:   HISTORY:   Past Medical History:   Diagnosis Date   ??? Alcohol abuse    ??? Arrhythmia     A-Fib   ??? Arthritis     osteoporosis   ??? Cancer (HCC)     skin cancer basal cell   ??? Cardiomyopathy (HCC) 09/11/2012    A.  Echo (09/11/12):  EF 35-40% with mod GHK.  Mildly dil RV.  Mod dil  RA/LA.  Mild MR.  Mod-severe TR.  Mod PR.  PASP 60.   ??? Hearing aid worn 08/3015   ??? Heart murmur    ??? Ill-defined condition     seasonal allergies., fatty liver, alcoholism   ??? Ill-defined condition     a-fib   ??? Liver disease     fatty liver   ??? MI (myocardial infarction) (HCC)     states 40 years ago   ??? Reynolds syndrome [facility])      Past Surgical History:   Procedure Laterality Date   ??? HX ORTHOPAEDIC  1960s    index finger laceration repaired       Prior Level of Function/Environment/Context: Pt lives with his son in [place] (currently in [place] visiting family), I with ADLs and ambulatory with SPC at baseline.   Home Situation  Home Environment: Private residence  # Steps to Enter: 4  One/Two Story Residence: Two story, live on 1st floor  # of Interior Steps: 12 (states he does not go upstairs)  Height of Each Step (in): 6 inches  Interior Rails: Left  Lift Chair Available: No  Living Alone: No  Support Systems: Child(ren), Family member(s), Friends \\ neighbors  Patient Expects to be Discharged to:: Private residence  Current DME Used/Available at Home: Gilmer Mor, straight, Environmental consultant, rolling    Hand dominance: Right    EXAMINATION OF PERFORMANCE DEFICITS:  Cognitive/Behavioral Status:  Neurologic State: Alert  Orientation Level: Oriented X4  Cognition: Follows commands  Perception: Appears intact  Perseveration: No perseveration noted  Safety/Judgement: Awareness of environment;Fall prevention;Home safety    Hearing:  Auditory  Auditory Impairment: Hard of hearing, bilateral, Hearing aid(s)  Hearing Aids/Status: At home, Bilateral    Vision/Perceptual:           Acuity: Within Defined Limits         Range of Motion:  AROM: Within functional limits  PROM: Within functional limits    Strength:  Strength: Within functional limits        Coordination:  Coordination: Within functional limits  Fine Motor Skills-Upper: Left Intact;Right Intact    Gross Motor Skills-Upper: Left Intact;Right Intact    Tone & Sensation:  Tone: Normal   Sensation: Intact        Balance:  Sitting: Intact  Standing: Intact    Functional Mobility and Transfers for ADLs:  Bed Mobility:  Rolling: Independent  Supine to Sit: Independent  Sit to Supine: Independent    Transfers:  Sit to Stand: Modified independent  Stand to Sit: Modified independent    ADL Assessment:  Feeding: Independent    Oral Facial Hygiene/Grooming: Independent    Bathing: Supervision;Additional time    Upper Body Dressing: Setup    Lower Body Dressing: Stand-by assistance    Toileting: Modified independent          ADL Intervention and task modifications:     Lower Body Dressing Assistance  Socks: Modified independent  Leg Crossed Method Used: Yes  Position Performed: Seated edge of bed         Cognitive Retraining  Safety/Judgement: Awareness of environment;Fall prevention;Home safety    Functional Measure:  Barthel Index:    Bathing: 5  Bladder: 10  Bowels: 10  Grooming: 5  Dressing: 10  Feeding: 10  Mobility: 15  Stairs: 5  Toilet Use: 10  Transfer (Bed to Chair and Back): 10  Total: 90       Barthel and G-code impairment scale:  Percentage of impairment CH  0% CI  1-19% CJ  20-39% CK  40-59% CL  60-79% CM  80-99% CN  100%   Barthel Score 0-100 100 99-80 79-60 59-40 20-39 1-19   0   Barthel Score 0-20 20 17-19 13-16 9-12 5-8 1-4 0      The Barthel ADL Index: Guidelines  1. The index should be used as a record of what a patient does, not as a record of what a patient could do.  2. The main aim is to establish degree of independence from any help, physical or verbal, however minor and for whatever reason.  3. The need for supervision renders the patient not independent.  4. A patient's performance should be established using the best available evidence. Asking the patient, friends/relatives and nurses are the usual sources, but direct observation and common sense are also important. However direct testing is not needed.  5. Usually the patient's performance over the preceding 24-48 hours is  important, but occasionally longer periods will be relevant.  6. Middle categories imply that the patient supplies over 50 per cent of the effort.  7. Use of aids to be independent is allowed.    Clarisa Kindred., Barthel, D.W. [phone]). Functional evaluation: the Barthel Index. Md State Med J (14)2.  Zenaida Niece der Soquel, J.J.M.F, [place], Ian Malkin., Margret Chance., [place], [place]. (1999). Measuring the change indisability after inpatient rehabilitation; comparison of the responsiveness of the Barthel Index and Functional  Independence Measure. Journal of Neurology, Neurosurgery, and Psychiatry, 66(4), [phone].  Dawson Bills, N.J.A, [place],  W.J.M, & [place]chap, M.A. (2004.) Assessment of post-stroke quality of life in cost-effectiveness studies: The usefulness of the Barthel Index and the EuroQoL-5D. Quality of Life Research, 13, 119-14       G codes:  In compliance with CMS???s Claims Based Outcome Reporting, the following G-code set was chosen for this patient based on their primary functional limitation being treated:    The outcome measure chosen to determine the severity of the functional limitation was the Barthel Index with a score of 90/100 which was correlated with the impairment scale.    ? Self Care:    [phone] - CURRENT STATUS: CI - 1%-19% impaired, limited or restricted   A2130 - GOAL STATUS: CI - 1%-19% impaired, limited or restricted   Q6578 - D/C STATUS:  CI - 1%-19% impaired, limited or restricted     Occupational Therapy Evaluation Charge Determination   History Examination Decision-Making   LOW Complexity : Brief history review  LOW Complexity : 1-3 performance deficits relating to physical, cognitive , or psychosocial skils that result in activity limitations and / or participation restrictions  LOW Complexity : No comorbidities that affect functional and no verbal or physical assistance needed to complete eval tasks       Based on the above components, the patient evaluation is determined to be  of the following complexity level: LOW   Pain:  Pain Scale 1: Numeric (0 - 10)  Pain Intensity 1: 0              Activity Tolerance:   Good  Please refer to the flowsheet for vital signs taken during this treatment.  After treatment:     Patient left in no apparent distress sitting up in chair    Patient left in no apparent distress in bed    Call bell left within reach    Nursing notified    Caregiver present    Bed alarm activated    COMMUNICATION/EDUCATION:   Communication/Collaboration:        Home safety education was provided and the patient/caregiver indicated understanding.        Patient/family have participated as able and agree with findings and recommendations.        Patient is unable to participate in plan of care at this time.  Findings and recommendations were discussed with: Physical Therapist and Registered Nurse    Orland Penman, OT  Time Calculation: 15 mins

## 2016-10-21 NOTE — Consults (Signed)
HISTORY OF PRESENTING ILLNESS      Dylan Haney is a 83 y.o. male presenting with recurrent syncope. PMH includes cardiomyopathy, atrial fibrillation, LVEF 35%. He has been evaluated in [place] by neurology/cardiology. He recently underwent brain MRI and a 48 Holter monitor. He was wearing the monitor while syncope occurred. He has experienced several episodes of syncope of the past 9 months. His son has witnessed some of these. He had a history of heavy alcohol use however son reports this has been eliminated. Denies chest pain, SOB, edema.        ACTIVE PROBLEM LIST     Patient Active Problem List    Diagnosis Date Noted   ??? Syncope and collapse 10/20/2016   ??? Frequent falls 10/20/2016   ??? Healthcare maintenance 04/27/2013   ??? Advance directive discussed with patient 02/02/2013   ??? Health care maintenance 12/01/2012   ??? Cardiomyopathy ([place]) 09/11/2012   ??? NASH (nonalcoholic steatohepatitis) 12/19/2011   ??? Macrocytosis 12/11/2011   ??? Elevated bilirubin 11/28/2011   ??? Unspecified vitamin D deficiency 11/28/2011   ??? Falls 11/27/2011   ??? ETOH abuse 11/27/2011   ??? Atrial fibrillation ([place]) 11/27/2011           MEDICATIONS     Current Facility-Administered Medications   Medication Dose Route Frequency   ??? 0.9% sodium chloride infusion  50 mL/hr IntraVENous CONTINUOUS   ??? aspirin chewable tablet 81 mg  81 mg Oral DAILY   ??? folic acid tablet 0.4 mg  400 mcg Oral DAILY   ??? cholecalciferol (VITAMIN D3) tablet 1,000 Units  1,000 Units Oral DAILY   ??? sodium chloride (NS) flush 5-10 mL  5-10 mL IntraVENous Q8H   ??? sodium chloride (NS) flush 5-10 mL  5-10 mL IntraVENous PRN   ??? acetaminophen (TYLENOL) tablet 650 mg  650 mg Oral Q4H PRN   ??? naloxone (NARCAN) injection 0.4 mg  0.4 mg IntraVENous PRN   ??? diphenhydrAMINE (BENADRYL) injection 12.5 mg  12.5 mg IntraVENous Q4H PRN   ??? ondansetron (ZOFRAN) injection 4 mg  4 mg IntraVENous Q6H PRN   ??? docusate sodium (COLACE) capsule 100 mg  100 mg Oral BID    ??? heparin (porcine) injection 5,000 Units  5,000 Units SubCUTAneous Q8H     Current Outpatient Prescriptions   Medication Sig   ??? folic acid 213 mcg tablet Take 1 tablet by mouth daily.   ??? cholecalciferol (VITAMIN D3) 1,000 unit tablet Take 1 tablet by mouth daily.   ??? aspirin 81 mg chewable tablet Take 1 Tab by mouth daily.           PAST MEDICAL HISTORY     Past Medical History:   Diagnosis Date   ??? Alcohol abuse    ??? Arrhythmia     A-Fib   ??? Arthritis     osteoporosis   ??? Cancer ([place])     skin cancer basal cell   ??? Cardiomyopathy ([place]) 09/11/2012    A.  Echo (09/11/12):  EF 35-40% with mod GHK.  Mildly dil RV.  Mod dil RA/LA.  Mild MR.  Mod-severe TR.  Mod PR.  PASP 60.   ??? Hearing aid worn 08/3015   ??? Heart murmur    ??? Ill-defined condition     seasonal allergies., fatty liver, alcoholism   ??? Ill-defined condition     a-fib   ??? Liver disease     fatty liver   ??? MI (myocardial infarction) ([place])  states 40 years ago   ??? Reynolds syndrome ([place])            PAST SURGICAL HISTORY     Past Surgical History:   Procedure Laterality Date   ??? HX ORTHOPAEDIC  1960s    index finger laceration repaired          ALLERGIES     No Known Allergies       FAMILY HISTORY     Family History   Problem Relation Age of Onset   ??? Lung Disease Mother    ??? Cancer Father     negative for cardiac disease       SOCIAL HISTORY     Social History     Social History   ??? Marital status: WIDOWED     Spouse name: N/A   ??? Number of children: N/A   ??? Years of education: N/A     Social History Main Topics   ??? Smoking status: Former Smoker     Packs/day: 1.00     Years: 50.00     Quit date: 07/22/1992   ??? Smokeless tobacco: Never Used   ??? Alcohol use 0.0 oz/week      Comment: States he drinks 2 small glasses of Vodka a week   ??? Drug use: No   ??? Sexual activity: Not on file     Other Topics Concern   ??? Not on file     Social History Narrative         MEDICATIONS     Current Facility-Administered Medications   Medication Dose Route Frequency    ??? 0.9% sodium chloride infusion  50 mL/hr IntraVENous CONTINUOUS   ??? aspirin chewable tablet 81 mg  81 mg Oral DAILY   ??? folic acid tablet 0.4 mg  400 mcg Oral DAILY   ??? cholecalciferol (VITAMIN D3) tablet 1,000 Units  1,000 Units Oral DAILY   ??? sodium chloride (NS) flush 5-10 mL  5-10 mL IntraVENous Q8H   ??? sodium chloride (NS) flush 5-10 mL  5-10 mL IntraVENous PRN   ??? acetaminophen (TYLENOL) tablet 650 mg  650 mg Oral Q4H PRN   ??? naloxone (NARCAN) injection 0.4 mg  0.4 mg IntraVENous PRN   ??? diphenhydrAMINE (BENADRYL) injection 12.5 mg  12.5 mg IntraVENous Q4H PRN   ??? ondansetron (ZOFRAN) injection 4 mg  4 mg IntraVENous Q6H PRN   ??? docusate sodium (COLACE) capsule 100 mg  100 mg Oral BID   ??? heparin (porcine) injection 5,000 Units  5,000 Units SubCUTAneous Q8H     Current Outpatient Prescriptions   Medication Sig   ??? folic acid 295 mcg tablet Take 1 tablet by mouth daily.   ??? cholecalciferol (VITAMIN D3) 1,000 unit tablet Take 1 tablet by mouth daily.   ??? aspirin 81 mg chewable tablet Take 1 Tab by mouth daily.       I have reviewed the nurses notes, vitals, problem list, allergy list, medical history, family, social history and medications.       REVIEW OF SYMPTOMS      General: Pt denies excessive weight gain or loss. Pt is able to conduct ADL's  HEENT: Denies blurred vision, headaches, hearing loss, epistaxis and difficulty swallowing.  Respiratory: Denies cough, congestion, shortness of breath, DOE, wheezing or stridor.  Cardiovascular: Denies precordial pain, palpitations, edema or PND  Gastrointestinal: Denies poor appetite, indigestion, abdominal pain or blood in stool  Genitourinary: Denies hematuria, dysuria, increased urinary frequency  Musculoskeletal: Denies  joint pain or swelling from muscles or joints  Neurologic: Denies tremor, paresthesias, headache, or sensory motor disturbance  Psychiatric: Denies confusion, insomnia, depression  Integumentray: Denies rash, itching or ulcers.   Hematologic: Denies easy bruising, bleeding       PHYSICAL EXAMINATION      Vitals:    10/21/16 1111 10/21/16 1116 10/21/16 1130 10/21/16 1200   BP: 128/61 132/75 149/72 134/86   Pulse: 71 68 63 66   Resp:   23 21   Temp:       SpO2:  98% 100% 100%   Weight:       Height:         General: Well developed, in no acute distress.  HEENT: No jaundice, oral mucosa moist, no oral ulcers  Neck: Supple, no stiffness, no lymphadenopathy, supple  Heart: ??Normal S1/S2 negative S3 or S4. Regular, no murmur, gallop or rub, no jugular venous distention  Respiratory: Clear bilaterally x 4, no wheezing or rales  Abdomen:?? ??Soft, non-tender, bowel sounds are active.??  Extremities:  No edema, normal cap refill, no cyanosis.  Musculoskeletal: No clubbing, no deformities  Neuro: A&Ox3, speech clear, gait stable, cooperative, no focal neurologic deficits  Skin: Skin color is normal. No rashes or lesions. Non diaphoretic, moist.  Vascular: 2+ pulses symmetric in all extremities       DIAGNOSTIC DATA      EKG:       LABORATORY DATA      Lab Results   Component Value Date/Time    WBC 6.5 10/21/2016 03:22 AM    HGB 11.6 (L) 10/21/2016 03:22 AM    HCT 36.6 10/21/2016 03:22 AM    PLATELET 226 10/21/2016 03:22 AM    MCV 95.8 10/21/2016 03:22 AM      Lab Results   Component Value Date/Time    Sodium 136 10/21/2016 03:22 AM    Potassium 4.6 10/21/2016 11:27 AM    Chloride 102 10/21/2016 03:22 AM    CO2 24 10/21/2016 03:22 AM    Anion gap 10 10/21/2016 03:22 AM    Glucose 84 10/21/2016 03:22 AM    BUN 24 (H) 10/21/2016 03:22 AM    Creatinine 1.12 10/21/2016 03:22 AM    BUN/Creatinine ratio 21 (H) 10/21/2016 03:22 AM    GFR est AA >60 10/21/2016 03:22 AM    GFR est non-AA >60 10/21/2016 03:22 AM    Calcium 8.7 10/21/2016 03:22 AM    Bilirubin, total 0.8 10/21/2016 03:22 AM    AST (SGOT) 39 (H) 10/21/2016 03:22 AM    Alk. phosphatase 130 (H) 10/21/2016 03:22 AM    Protein, total 7.6 10/21/2016 03:22 AM    Albumin 3.1 (L) 10/21/2016 03:22 AM     Globulin 4.5 (H) 10/21/2016 03:22 AM    A-G Ratio 0.7 (L) 10/21/2016 03:22 AM    ALT (SGPT) 24 10/21/2016 03:22 AM           ASSESSMENT      1. Syncope   A. Recurrent  2. Cardiomyopathy   A. LVEF 35%  3. Atrial fibrillation   A. Long-standing         PLAN     Plan for follow up with cardiologist in [place] to evaluate monitor findings.         Thank you, Dr. Lazaro Arms for involving me in the care of this extraordinarily pleasant male. Please do not hesitate to contact me for further questions/concerns.         Candis Schatz, MD  Cardiac Electrophysiology / Cardiology    [facility]  [address], [place]    [place], [place]  [phone] / [phone] Fax   [phone] / [phone] Fax

## 2016-10-21 NOTE — ED Notes (Signed)
Dr.Liu in ED to evaluate pt.

## 2016-10-21 NOTE — ED Notes (Signed)
Notified that tube for potassium was hemolyzed.  Will need to redraw/reorder.

## 2016-10-21 NOTE — Consults (Addendum)
NEUROLOGY IN-PATIENT NEW CONSULTATION      10/21/2016    RE: ORLIE CUNDARI         October 12, 1924      REFERRED BY:  Phys Other, MD      CHIEF COMPLAINT:  This is DAMARIS GEERS is a 83 y.o. male right handed who had concerns including Syncope.    HPI:     For the past 8 months, patient had a total of 4 episodes of being lightheaded and weak around 2-3 AM while trying to go to the bathroom.    Recently, patient somewhat choked or gagged due to a phlegm and had similar episode.    Patient seen by a cardiologist and neurologist at [facility] and MRI brain and EEG were both normal as per patient.    Currently has a cardiac monitor.    (-) tongue bite (-) incontinence (-) post-ictal confusion    ROS  All other systems reviewed and are negative    Past Medical Hx  Past Medical History:   Diagnosis Date   ??? Alcohol abuse    ??? Arrhythmia     A-Fib   ??? Arthritis     osteoporosis   ??? Cancer ([place])     skin cancer basal cell   ??? Cardiomyopathy ([place]) 09/11/2012    A.  Echo (09/11/12):  EF 35-40% with mod GHK.  Mildly dil RV.  Mod dil RA/LA.  Mild MR.  Mod-severe TR.  Mod PR.  PASP 60.   ??? Hearing aid worn 08/3015   ??? Heart murmur    ??? Ill-defined condition     seasonal allergies., fatty liver, alcoholism   ??? Ill-defined condition     a-fib   ??? Liver disease     fatty liver   ??? MI (myocardial infarction) ([place])     states 40 years ago   ??? Reynolds syndrome [facility])        Social Hx  Social History     Social History   ??? Marital status: WIDOWED     Spouse name: N/A   ??? Number of children: N/A   ??? Years of education: N/A     Social History Main Topics   ??? Smoking status: Former Smoker     Packs/day: 1.00     Years: 50.00     Quit date: 07/22/1992   ??? Smokeless tobacco: Never Used   ??? Alcohol use 0.0 oz/week      Comment: States he drinks 2 small glasses of Vodka a week   ??? Drug use: No   ??? Sexual activity: Not on file     Other Topics Concern   ??? Not on file     Social History Narrative       Family Hx  Family History    Problem Relation Age of Onset   ??? Lung Disease Mother    ??? Cancer Father        ALLERGIES  No Known Allergies    CURRENT MEDS  Current Facility-Administered Medications   Medication Dose Route Frequency Provider Last Rate Last Dose   ??? 0.9% sodium chloride infusion  50 mL/hr IntraVENous CONTINUOUS Khoi B Do, MD 50 mL/hr at 10/21/16 0923 50 mL/hr at 10/21/16 2725   ??? aspirin chewable tablet 81 mg  81 mg Oral DAILY Khoi B Do, MD   81 mg at 36/64/40 3474   ??? folic acid tablet 0.4 mg  400 mcg Oral DAILY Khoi B Do,  MD   0.4 mg at 10/21/16 1103   ??? cholecalciferol (VITAMIN D3) tablet 1,000 Units  1,000 Units Oral DAILY Khoi B Do, MD   1,000 Units at 10/21/16 0921   ??? sodium chloride (NS) flush 5-10 mL  5-10 mL IntraVENous Q8H Khoi B Do, MD   10 mL at 10/21/16 0516   ??? sodium chloride (NS) flush 5-10 mL  5-10 mL IntraVENous PRN Khoi B Do, MD       ??? acetaminophen (TYLENOL) tablet 650 mg  650 mg Oral Q4H PRN Brynda Greathouse, MD       ??? naloxone (NARCAN) injection 0.4 mg  0.4 mg IntraVENous PRN Khoi B Do, MD       ??? diphenhydrAMINE (BENADRYL) injection 12.5 mg  12.5 mg IntraVENous Q4H PRN Khoi B Do, MD       ??? ondansetron (ZOFRAN) injection 4 mg  4 mg IntraVENous Q6H PRN Brynda Greathouse, MD       ??? docusate sodium (COLACE) capsule 100 mg  100 mg Oral BID Khoi B Do, MD   100 mg at 10/21/16 6283   ??? heparin (porcine) injection 5,000 Units  5,000 Units SubCUTAneous Q8H Khoi B Do, MD   5,000 Units at 10/21/16 0515     Current Outpatient Prescriptions   Medication Sig Dispense Refill   ??? folic acid 151 mcg tablet Take 1 tablet by mouth daily. 30 tablet 11   ??? cholecalciferol (VITAMIN D3) 1,000 unit tablet Take 1 tablet by mouth daily. 30 tablet 11   ??? aspirin 81 mg chewable tablet Take 1 Tab by mouth daily. 90 Tab 3           PREVIOUS WORKUP: (reviewed)  IMAGING:    CT Results (recent):    Results from Hospital Encounter encounter on 10/20/16   CTA CHEST W OR W WO CONT   Narrative INDICATION:  syncope, r/o PE      EXAM:  CTA Chest with contrast for Pulmonary Embolus    COMPARISON:  Chest radiograph earlier today    TECHNIQUE:  Following the uneventful intravenous administration of 100 cc Isovue  761, thin helical axial images were obtained through the chest. 3D image  postprocessing was performed.  CT dose reduction was achieved through use of a  standardized protocol tailored for this examination and automatic exposure  control for dose modulation.    FINDINGS:    THYROID: Unremarkable.  MEDIASTINUM/HILA: No mass or lymphadenopathy.  HEART/PERICARDIUM: Mildly enlarged. Reflux of contrast material into the IVC and  hepatic veins suggests right heart failure.  PULMONARY ARTERIES: Cardiorespiratory motion and suboptimal contrast  opacification/admixture of contrast at the lung bases with no large or central  pulmonary embolus.  AORTA:  No aneurysm.  LUNGS/PLEURA: Bilateral lower lobe atelectasis. Small to moderate pleural  effusions. Calcified pleural plaques. Emphysema and mild interstitial edema.  INCIDENTALLY IMAGED UPPER ABDOMEN: No significant abnormality.  BONES: No destructive bone lesion.  ADDITIONAL COMMENTS:  N/A         Impression IMPRESSION:    1. Technical factors as above with no definite pulmonary embolus.  2. Cardiomegaly with evidence of right heart failure, mild interstitial edema  and small to moderate pleural effusions. Calcified pleural plaques and areas of  rounded atelectasis.            MRI Results (recent):  No results found for this or any previous visit.    IR Results (recent):  No results found for this or any previous  visit.    VAS/US Results (recent):    Results from Hospital Encounter encounter on 10/20/16   [place] (reviewed)  Results for orders placed or performed during the hospital encounter of 10/20/16   URINE CULTURE HOLD SAMPLE   Result Value Ref Range    Urine culture hold        URINE ON HOLD IN MICROBIOLOGY DEPT FOR 3 DAYS. IF UNPRESERVED URINE IS  SUBMITTED, IT CANNOT BE USED FOR ADDITIONAL TESTING AFTER 24 HRS, RECOLLECTION WILL BE REQUIRED.   CBC WITH AUTOMATED DIFF   Result Value Ref Range    WBC 7.5 4.1 - 11.1 K/uL    RBC 3.84 (L) 4.10 - 5.70 M/uL    HGB 11.6 (L) 12.1 - 17.0 g/dL    HCT 37.1 36.6 - 50.3 %    MCV 96.6 80.0 - 99.0 FL    MCH 30.2 26.0 - 34.0 PG    MCHC 31.3 30.0 - 36.5 g/dL    RDW 15.0 (H) 11.5 - 14.5 %    PLATELET 230 150 - 400 K/uL    MPV 9.6 8.9 - 12.9 FL    NRBC 0.0 0 PER 100 WBC    ABSOLUTE NRBC 0.00 0.00 - 0.01 K/uL    NEUTROPHILS 67 32 - 75 %    LYMPHOCYTES 24 12 - 49 %    MONOCYTES 8 5 - 13 %    EOSINOPHILS 2 0 - 7 %    BASOPHILS 0 0 - 1 %    IMMATURE GRANULOCYTES 0 0.0 - 0.5 %    ABS. NEUTROPHILS 5.0 1.8 - 8.0 K/UL    ABS. LYMPHOCYTES 1.8 0.8 - 3.5 K/UL    ABS. MONOCYTES 0.6 0.0 - 1.0 K/UL    ABS. EOSINOPHILS 0.1 0.0 - 0.4 K/UL    ABS. BASOPHILS 0.0 0.0 - 0.1 K/UL    ABS. IMM. GRANS. 0.0 0.00 - 0.04 K/UL    DF AUTOMATED     METABOLIC PANEL, COMPREHENSIVE   Result Value Ref Range    Sodium 138 136 - 145 mmol/L    Potassium 4.7 3.5 - 5.1 mmol/L    Chloride 103 97 - 108 mmol/L    CO2 26 21 - 32 mmol/L    Anion gap 9 5 - 15 mmol/L    Glucose 102 (H) 65 - 100 mg/dL    BUN 30 (H) 6 - 20 MG/DL    Creatinine 1.27 0.70 - 1.30 MG/DL    BUN/Creatinine ratio 24 (H) 12 - 20      GFR est AA >60 >60 ml/min/1.51m    GFR est non-AA 53 (L) >60 ml/min/1.760m   Calcium 9.0 8.5 - 10.1 MG/DL    Bilirubin, total 0.6 0.2 - 1.0 MG/DL    ALT (SGPT) 25 12 - 78 U/L    AST (SGOT) 33 15 - 37 U/L    Alk. phosphatase 142 (H) 45 - 117 U/L    Protein, total 7.9 6.4 - 8.2 g/dL    Albumin 3.4 (L) 3.5 - 5.0 g/dL    Globulin 4.5 (H) 2.0 - 4.0 g/dL    A-G Ratio 0.8 (L) 1.1 - 2.2     TROPONIN I   Result Value Ref Range    Troponin-I, Qt. <0.05 <0.05 ng/mL   MAGNESIUM   Result Value Ref Range    Magnesium 1.9 1.6 - 2.4 mg/dL   URINALYSIS W/MICROSCOPIC   Result Value Ref Range  Color YELLOW/STRAW      Appearance CLEAR CLEAR      Specific gravity 1.018 1.003 - 1.030       pH (UA) 5.5 5.0 - 8.0      Protein 30 (A) NEG mg/dL    Glucose NEGATIVE  NEG mg/dL    Ketone NEGATIVE  NEG mg/dL    Bilirubin NEGATIVE  NEG      Blood NEGATIVE  NEG      Urobilinogen 2.0 (H) 0.2 - 1.0 EU/dL    Nitrites NEGATIVE  NEG      Leukocyte Esterase NEGATIVE  NEG      WBC 0-4 0 - 4 /hpf    RBC 0-5 0 - 5 /hpf    Epithelial cells FEW FEW /lpf    Bacteria NEGATIVE  NEG /hpf    Hyaline cast 0-2 0 - 5 /lpf   PTT   Result Value Ref Range    aPTT 29.6 22.1 - 32.0 sec    aPTT, therapeutic range     58.0 - 77.0 SECS   PROTHROMBIN TIME + INR   Result Value Ref Range    INR 1.1 0.9 - 1.1      Prothrombin time 11.4 (H) 9.0 - 11.1 sec   NT-PRO BNP   Result Value Ref Range    NT pro-BNP 3797 (H) 0 - 347 PG/ML   METABOLIC PANEL, BASIC   Result Value Ref Range    Sodium 136 136 - 145 mmol/L    Potassium 5.6 (H) 3.5 - 5.1 mmol/L    Chloride 102 97 - 108 mmol/L    CO2 24 21 - 32 mmol/L    Anion gap 10 5 - 15 mmol/L    Glucose 84 65 - 100 mg/dL    BUN 24 (H) 6 - 20 MG/DL    Creatinine 1.12 0.70 - 1.30 MG/DL    BUN/Creatinine ratio 21 (H) 12 - 20      GFR est AA >60 >60 ml/min/1.27m    GFR est non-AA >60 >60 ml/min/1.715m   Calcium 8.7 8.5 - 10.1 MG/DL   LIPID PANEL   Result Value Ref Range    LIPID PROFILE          Cholesterol, total 106 <200 MG/DL    Triglyceride 52 <150 MG/DL    HDL Cholesterol 46 MG/DL    LDL, calculated 49.6 0 - 100 MG/DL    VLDL, calculated 10.4 MG/DL    CHOL/HDL Ratio 2.3 0 - 5.0     CBC W/O DIFF   Result Value Ref Range    WBC 6.5 4.1 - 11.1 K/uL    RBC 3.82 (L) 4.10 - 5.70 M/uL    HGB 11.6 (L) 12.1 - 17.0 g/dL    HCT 36.6 36.6 - 50.3 %    MCV 95.8 80.0 - 99.0 FL    MCH 30.4 26.0 - 34.0 PG    MCHC 31.7 30.0 - 36.5 g/dL    RDW 15.0 (H) 11.5 - 14.5 %    PLATELET 226 150 - 400 K/uL    MPV 9.6 8.9 - 12.9 FL    NRBC 0.0 0 PER 100 WBC    ABSOLUTE NRBC 0.00 0.00 - 0.01 K/uL   CK W/ CKMB & INDEX   Result Value Ref Range    CK 104 39 - 308 U/L    CK - MB 1.5 <3.6 NG/ML    CK-MB Index 1.4 0 - 2.5  TSH 3RD GENERATION   Result Value Ref Range    TSH 1.48 0.36 - 3.74 uIU/mL   AMMONIA   Result Value Ref Range    Ammonia 17 <32 UMOL/L   LACTIC ACID   Result Value Ref Range    Lactic acid 1.4 0.4 - 2.0 MMOL/L   MAGNESIUM   Result Value Ref Range    Magnesium 1.8 1.6 - 2.4 mg/dL   PHOSPHORUS   Result Value Ref Range    Phosphorus 3.8 2.6 - 4.7 MG/DL   HEPATIC FUNCTION PANEL   Result Value Ref Range    Protein, total 7.6 6.4 - 8.2 g/dL    Albumin 3.1 (L) 3.5 - 5.0 g/dL    Globulin 4.5 (H) 2.0 - 4.0 g/dL    A-G Ratio 0.7 (L) 1.1 - 2.2      Bilirubin, total 0.8 0.2 - 1.0 MG/DL    Bilirubin, direct HEMOLYZED,RECOLLECT REQUESTED 0.0 - 0.2 MG/DL    Alk. phosphatase 130 (H) 45 - 117 U/L    AST (SGOT) 39 (H) 15 - 37 U/L    ALT (SGPT) 24 12 - 78 U/L   TROPONIN I   Result Value Ref Range    Troponin-I, Qt. <0.05 <0.05 ng/mL   POTASSIUM   Result Value Ref Range    Potassium 4.6 3.5 - 5.1 mmol/L   SAMPLES BEING HELD   Result Value Ref Range    SAMPLES BEING HELD LV     COMMENT        Add-on orders for these samples will be processed based on acceptable specimen integrity and analyte stability, which may vary by analyte.   EKG, 12 LEAD, INITIAL   Result Value Ref Range    Ventricular Rate 70 BPM    Atrial Rate 0 BPM    QRS Duration 132 ms    Q-T Interval 440 ms    QTC Calculation (Bezet) 475 ms    Calculated R Axis -43 degrees    Calculated T Axis 41 degrees    Diagnosis       Atrial fibrillation  Left axis deviation  Left ventricular hypertrophy with QRS widening  Cannot rule out Septal infarct , age undetermined  Abnormal ECG  When compared with ECG of 20-Oct-2016 20:17,  MANUAL COMPARISON REQUIRED, DATA IS UNCONFIRMED         Physical Exam:     Visit Vitals   ??? BP 133/70   ??? Pulse 65   ??? Temp 97.8 ??F (36.6 ??C)   ??? Resp 17   ??? Ht '5\' 8"'$  (1.727 m)   ??? Wt 61.2 kg (135 lb)   ??? SpO2 98%   ??? BMI 20.53 kg/m2     General:  Alert, cooperative, no distress.   Head:  Normocephalic, without obvious abnormality, atraumatic.    Eyes:  Conjunctivae/corneas clear.    Lungs:  Heart:   Non labored breathing  Regular rate and rhythm, no carotid bruits   Abdomen:   Soft, non-distended   Extremities: Extremities normal, atraumatic, no cyanosis or edema.   Pulses: 2+ and symmetric all extremities.   Skin: Skin color, texture, turgor normal. No rashes or lesions.  Neurologic Exam     Gen: Attention normal             Language: naming, repetition, fluency normal             Memory: intact recent and remote memory  Cranial Nerves:  I: smell Not tested  II: visual fields Full to confrontation   II: pupils Equal, round, reactive to light   II: optic disc No papilledema   III,VII: ptosis none   III,IV,VI: extraocular muscles  Full ROM   V: mastication normal   V: facial light touch sensation  normal   VII: facial muscle function   symmetric   VIII: hearing symmetric   IX: soft palate elevation  normal   XI: trapezius strength  5/5   XI: sternocleidomastoid strength 5/5   XI: neck flexion strength  5/5   XII: tongue  midline     Motor: normal bulk and tone, no tremor              Strength: 5/5 all four extremities  Sensory: intact to LT, PP, vibration, and temperature  Reflexes: 2+ throughout; trace ankles, Down going toes  Coordination: Good FTN and HTS  Gait: deferred           Impression:     BILLYJOE GO is a 83 y.o. 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 who for the past 8 months, had a total of 4 episodes of being lightheaded and weak around 2-3 AM while trying to go to the bathroom. Recently, patient somewhat choked or gagged due to a phlegm and had similar episode. Consideration includes near syncope in a 83 yo with compromised cardiac condition (heart failure  with EF 35% and Afib). Patient seen by a cardiologist and neurologist at [facility] and MRI brain and EEG were both normal as per patient.      RECOMMENDATIONS  1) Discussed with patient and son the pathophysiology of syncope and the need to avoid circumstances that can trigger symptoms (quickly standing from sitting position, dehydration)  2) Went over with patient a list of things to do to prevent an event ( perform lower extremity exercises, raise head of bed 6-10 inches, on bad days drink 500 cc water quickly, waist high elastic support stockings, physical counter maneuvers)  3) Regularly monitor BP specially during an event  4) Physical therapy  5) Will defer to cardiology regarding work up for other cardiac causes of these episodes (i.e arrhythmia)    Please call for questions        Thank you for the consultation      Shelbie Hutching, MD  Diplomate, American Board of Psychiatry and Neurology  Diplomate, Neuromuscular Medicine  Diplomate, American Board of Electrodiagnostic Medicine    Greater than 50% of time spent counseling patient        CC: Phys Other, MD  Fax: None

## 2016-10-21 NOTE — ED Notes (Addendum)
[place]  Assumed care of pt. Bed locked and in low position with call bell within reach.   Using AIDET-Introduced self as Primary RN and plan discussed with pt with understanding was verbalized.  Pt denies additional complaints at this time.  White board updated with this nurse's name.  Patient advised that medical information will be discussed and it is their own responsibility to tell nurse if such conversation should not take place in the presence of visitors.  Pt verbalizes understanding. Pt on monitor x 3 with NAD.  afib on monitor.  Pt's son asleep on stretcher at bedside.  Tech at bedside for echo.

## 2016-10-21 NOTE — Progress Notes (Signed)
Echo completed report to follow.

## 2016-10-21 NOTE — ED Notes (Signed)
OT in dept to evaluate pt.

## 2016-10-21 NOTE — ED Notes (Signed)
Tech at bedside for carotid duplex study

## 2016-10-21 NOTE — Progress Notes (Signed)
physical Therapy EVALUATION/DISCHARGE  Patient: Dylan Haney (83 y.o. male)  Date: 10/21/2016  Primary Diagnosis: Syncope and collapse        Precautions:   Fall  ASSESSMENT :  Based on the objective data described below, the patient presents with syncopal episodes that have been occurring at night only for 6-9 months.  Patient lives in [place] with his son, and was visiting [place] for a family gathering.  Patient normally ambulates with a SPC, son reports no falls during the day.  But at night he has had episodes of shaking with unresponsiveness while on the commode.  Current work-up in ED here, CT negative for acute infarct.   Patient has been followed by a neurologist in [place] and just started seeing a cardiologist in [place].  Sons home is 2 story but he resides on the first floor, 4 steps to enter.    Today patient is received supine.  He is alert and oriented, not confused and able to follow all commands.  Patient able to transfer Independently.  He was able to ambulate short distance at edge of bed, but limited distance due to IV line and no IV pole (unit attached to bed).  His vitals were stable throughout- see below.  Not orthostatic.  He is currently at his baseline.  Recommend bedside commode for transfers and limit ambulation distance due to episodes occurring in the night.  He may benefit from home health PT, son states he currently is looking into it via PCP in [place]..    Skilled physical therapy is not indicated at this time.     PLAN :  Discharge Recommendations: Home Health  Further Equipment Recommendations for Discharge: owns [facility], recommend a bedside commode.  Instructed son this is an out of pocket expense, and he will look at DME companies in [place]     SUBJECTIVE:   Patient stated ???hi there!.???    OBJECTIVE DATA SUMMARY:   HISTORY:    Past Medical History:   Diagnosis Date   ??? Alcohol abuse    ??? Arrhythmia     A-Fib   ??? Arthritis     osteoporosis   ??? Cancer (HCC)     skin cancer basal cell    ??? Cardiomyopathy (HCC) 09/11/2012    A.  Echo (09/11/12):  EF 35-40% with mod GHK.  Mildly dil RV.  Mod dil RA/LA.  Mild MR.  Mod-severe TR.  Mod PR.  PASP 60.   ??? Hearing aid worn 08/3015   ??? Heart murmur    ??? Ill-defined condition     seasonal allergies., fatty liver, alcoholism   ??? Ill-defined condition     a-fib   ??? Liver disease     fatty liver   ??? MI (myocardial infarction) (HCC)     states 40 years ago   ??? Reynolds syndrome [facility])      Past Surgical History:   Procedure Laterality Date   ??? HX ORTHOPAEDIC  1960s    index finger laceration repaired     Prior Level of Function/Home Situation: see above  Personal factors and/or comorbidities impacting plan of care:     Home Situation  Home Environment: Private residence  # Steps to Enter: 4  One/Two Story Residence: Two story, live on 1st floor  # of Interior Steps: 12 (states he does not go upstairs)  Height of Each Step (in): 6 inches  Interior Rails: Left  Lift Chair Available: No  Living Alone: No  Support  Systems: Child(ren), Family member(s), Friends \\ neighbors  Patient Expects to be Discharged to:: Private residence  Current DME Used/Available at Home: Gilmer Mor, straight, Environmental consultant, rolling    EXAMINATION/PRESENTATION/DECISION MAKING:   Vitals  Vitals:    10/21/16 1045 10/21/16 1100 10/21/16 1111 10/21/16 1116   BP: 152/68 142/76 128/61 132/75   BP 1 Location:  Left arm Left arm Left arm   BP Patient Position:  Supine Sitting Standing;Post activity   Pulse: 65 65 71 68   Resp: 21      Temp:       SpO2: 100%   98%   Weight:       Height:         Critical Behavior:              Hearing:  Auditory  Auditory Impairment: Hard of hearing, bilateral, Hearing aid(s)  Hearing Aids/Status: At home, Bilateral  Skin:  All exposed intact  Edema: none noted  Range Of Motion:  AROM: Within functional limits           PROM: Within functional limits           Strength:    Strength: Within functional limits                    Tone & Sensation:   Tone: Normal               Sensation: Intact               Coordination:  Coordination: Within functional limits  Vision:      Functional Mobility:  Bed Mobility:  Rolling: Independent  Supine to Sit: Independent  Sit to Supine: Independent     Transfers:  Sit to Stand: Modified independent  Stand to Sit: Modified independent                       Balance:   Sitting: Intact  Standing: Intact  Ambulation/Gait Training:  Distance (ft): 5 Feet (ft)  Assistive Device: Gait belt  Ambulation - Level of Assistance: Supervision     Gait Description (WDL): Exceptions to WDL                                              Stairs:               Therapeutic Exercises:       Functional Measure:  Barthel Index:    Bathing: 5  Bladder: 10  Bowels: 10  Grooming: 5  Dressing: 10  Feeding: 10  Mobility: 15  Stairs: 5  Toilet Use: 10  Transfer (Bed to Chair and Back): 10  Total: 90       Barthel and G-code impairment scale:  Percentage of impairment CH  0% CI  1-19% CJ  20-39% CK  40-59% CL  60-79% CM  80-99% CN  100%   Barthel Score 0-100 100 99-80 79-60 59-40 20-39 1-19   0   Barthel Score 0-20 20 17-19 13-16 9-12 5-8 1-4 0      The Barthel ADL Index: Guidelines  1. The index should be used as a record of what a patient does, not as a record of what a patient could do.  2. The main aim is to establish degree of independence from any help, physical or verbal, however  minor and for whatever reason.  3. The need for supervision renders the patient not independent.  4. A patient's performance should be established using the best available evidence. Asking the patient, friends/relatives and nurses are the usual sources, but direct observation and common sense are also important. However direct testing is not needed.  5. Usually the patient's performance over the preceding 24-48 hours is important, but occasionally longer periods will be relevant.  6. Middle categories imply that the patient supplies over 50 per cent of the effort.   7. Use of aids to be independent is allowed.    Clarisa Kindred., Barthel, D.W. [phone]). Functional evaluation: the Barthel Index. Md State Med J (14)2.  Zenaida Niece [place], J.J.M.F, [place], Ian Malkin., Margret Chance., [place], [place]. (1999). Measuring the change indisability after inpatient rehabilitation; comparison of the responsiveness of the Barthel Index and Functional Independence Measure. Journal of Neurology, Neurosurgery, and Psychiatry, 66(4), [phone].  Dawson Bills, N.J.A, [place],  W.J.M, & Koopmanschap, M.A. (2004.) Assessment of post-stroke quality of life in cost-effectiveness studies: The usefulness of the Barthel Index and the EuroQoL-5D. Quality of Life Research, 13, 098-11       G codes:  In compliance with CMS???s Claims Based Outcome Reporting, the following G-code set was chosen for this patient based on their primary functional limitation being treated:    The outcome measure chosen to determine the severity of the functional limitation was the Barthel Index with a score of 90/100 which was correlated with the impairment scale.    ? Mobility - Walking and Moving Around:    [phone] - CURRENT STATUS: CI - 1%-19% impaired, limited or restricted   G8979 - GOAL STATUS: CI - 1%-19% impaired, limited or restricted   G9562 - D/C STATUS:  CI - 1%-19% impaired, limited or restricted        Physical Therapy Evaluation Charge Determination   History Examination Presentation Decision-Making   HIGH Complexity :3+ comorbidities / personal factors will impact the outcome/ POC  MEDIUM Complexity : 3 Standardized tests and measures addressing body structure, function, activity limitation and / or participation in recreation  LOW Complexity : Stable, uncomplicated  Other outcome measures Barthel Index  LOW       Based on the above components, the patient evaluation is determined to be of the following complexity level: LOW     Pain:  Pain Scale 1: Numeric (0 - 10)  Pain Intensity 1: 0     Activity Tolerance:    No complications with upright activity  Please refer to the flowsheet for vital signs taken during this treatment.  After treatment:      Patient left in no apparent distress sitting up in chair     Patient left in no apparent distress in bed     Call bell left within reach     Nursing notified     Caregiver present     Bed alarm activated    COMMUNICATION/EDUCATION:   Communication/Collaboration:     Fall prevention education was provided and the patient/caregiver indicated understanding.     Patient/family have participated as able and agree with findings and recommendations.     Patient is unable to participate in plan of care at this time.  Findings and recommendations were discussed with: Registered Nurse    Thank you for this referral.  Mick Sell, PT, DPT   Time Calculation: 25 mins

## 2016-10-21 NOTE — Progress Notes (Addendum)
Dylan Haney  D[place]aney, [place] [address]  [phone]      Medical Progress Note      NAME: Dylan Haney   DOB:  07/14/1924  MRM:  [id number]    Date/Time: 10/21/2016          Subjective:     Chief Complaint:  F/u syncope    Chart/notes/labs/studies reviewed, patient examined at bedside.    No events. Denies HA, dizziness, CP, SOB, palpitations.         Objective:       Vitals:          Last 24hrs VS reviewed since prior progress note. Most recent are:    Visit Vitals   ??? BP 134/86   ??? Pulse 66   ??? Temp 97.8 ??F (36.6 ??C)   ??? Resp 21   ??? Ht '5\' 8"'$  (1.727 m)   ??? Wt 61.2 kg (135 lb)   ??? SpO2 100%   ??? BMI 20.53 kg/m2     SpO2 Readings from Last 6 Encounters:   10/21/16 100%   09/28/15 97%   06/01/15 96%   05/30/15 97%   05/27/15 96%   05/25/15 96%          Intake/Output Summary (Last 24 hours) at 10/21/16 1252  Last data filed at 10/21/16 6967   Gross per 24 hour   Intake              600 ml   Output              250 ml   Net              350 ml          Exam:     Physical Exam:    Gen: elderly, frail, in no acute distress. Pleasant   HEENT:  Sclerae nonicteric, hearing intact to voice, mucous membranes moist  Neck:  Supple, without masses.  Resp:  No accessory muscle use, CTAB without wheezes, rales, or rhonchi  Card: irregularly irregular, 2/6 systolic murmur. No rubs or gallops. No LE edema.   Abd:  +bowel sounds, soft, NTTP, nondistended.  Neuro: Face symmetric, tongue midline, speech fluent, follows commands appropriately  Psych:  Alert, oriented to self, place, time, and situation. Limited insight     Medications Reviewed: (see below)    Lab Data Reviewed: (see below)    ______________________________________________________________________    Medications:     Current Facility-Administered Medications   Medication Dose Route Frequency   ??? 0.9% sodium chloride infusion  50 mL/hr IntraVENous CONTINUOUS   ??? aspirin chewable tablet 81 mg  81 mg Oral DAILY    ??? folic acid tablet 0.4 mg  400 mcg Oral DAILY   ??? cholecalciferol (VITAMIN D3) tablet 1,000 Units  1,000 Units Oral DAILY   ??? sodium chloride (NS) flush 5-10 mL  5-10 mL IntraVENous Q8H   ??? sodium chloride (NS) flush 5-10 mL  5-10 mL IntraVENous PRN   ??? acetaminophen (TYLENOL) tablet 650 mg  650 mg Oral Q4H PRN   ??? naloxone (NARCAN) injection 0.4 mg  0.4 mg IntraVENous PRN   ??? diphenhydrAMINE (BENADRYL) injection 12.5 mg  12.5 mg IntraVENous Q4H PRN   ??? ondansetron (ZOFRAN) injection 4 mg  4 mg IntraVENous Q6H PRN   ??? docusate sodium (COLACE) capsule 100 mg  100 mg Oral BID   ??? heparin (porcine) injection 5,000 Units  5,000 Units  SubCUTAneous Q8H     Current Outpatient Prescriptions   Medication Sig   ??? folic acid 242 mcg tablet Take 1 tablet by mouth daily.   ??? cholecalciferol (VITAMIN D3) 1,000 unit tablet Take 1 tablet by mouth daily.   ??? aspirin 81 mg chewable tablet Take 1 Tab by mouth daily.            Lab Review:     Recent Labs      10/21/16   0322  10/20/16   2022   WBC  6.5  7.5   HGB  11.6*  11.6*   HCT  36.6  37.1   PLT  226  230     Recent Labs      10/21/16   1127  10/21/16   0322  10/20/16   2022   NA   --   136  138   K  4.6  5.6*  4.7   CL   --   102  103   CO2   --   24  26   GLU   --   84  102*   BUN   --   24*  30*   CREA   --   1.12  1.27   CA   --   8.7  9.0   MG   --   1.8  1.9   PHOS   --   3.8   --    ALB   --   3.1*  3.4*   SGOT   --   39*  33   ALT   --   24  25   INR   --    --   1.1     No components found for: GLPOC  No results for input(s): PH, PCO2, PO2, HCO3, FIO2 in the last 72 hours.  Recent Labs      10/20/16   2022   INR  1.1     No results found for: SDES  No results found for: CULT         Assessment / Plan:      Syncope and collapse: unclear etiology. Recurrent over the past 9 months. Ddx cardiogenic (arrhythmias, has a-fib, cardiomyopathy so at risk for VT/VF) vs less likely neurogenic (?undiagnosed seizure). No orthostatic  hypotension. CTA negative for PE. Per report/son, has had negative MRI brain, EEG. Currently wearing ?Holter monitor set up by his cardiologist in [place]. Will discuss with cards. TTE repeated here, pending. Cardiology and neurology consulted, awaiting their input   ADDENDUM: discussed with Dr. Kathyrn Haney, will plan to keep patient and monitor overnight. His office will attempt to download the data from pt's Holter monitor tomorrow. His syncopal event occurred around 7:30pm last night   ADDENDUM: 3:35 PM   Met with family again to discuss plans for monitoring another night in hospital. Pt's son feels strongly about taking pt home to [facility] so his cardiologist there (Dr. Marijo Haney) can review his Holter in the morning. Reviewed risk and benefit of further observation vs discharge and return to [facility] tonight, pt and family wish to take him home. Discussed with Dr. Kathyrn Haney again, and he felt it was okay for pt to be discharged. TTE not reported yet, but per Dr. Kathyrn Haney, showing reduced EF, with AR and TR. Nothing alarming on telemetry per cardiology. Advised that pt needs close follow up with cards in [place]. Will send DC summary to Dr. Charlann Haney office  Chronic afib: Manual HR ~75, also consistent on EKG, however, monitor had shown HR ~140-160.  Not on rate control agents.  Not on anticoagulation due to falls. Deferring mgmt to cardiology. Echo repeated as above  ??   Mild pulm edema/pleural effusion/hx of CM: CT chest showed evidence of right heart failure, mild interstitial edema  and small to moderate pleural effusion. Echo pending. Cardiology to see. Hold diuresis for now  ??   Fatty liver: LFTs wnl.  Will monitor  ??   Frequent falls: due to above.  PT recommends HH. Lives in [place], so will need to have it arranged there      Total time spent:  35 minutes + add'l 15 minutes  Time spent in the care of this patient including reviewing records, discussing with nursing and/or other providers on the treatment team,  obtaining history and examining the patient, and discussing treatment plans.                  Care Plan discussed with: Patient, Nursing Staff and >50% of time spent in counseling and coordination of care    Discussed:  Care Plan and D/C Planning    Prophylaxis:  Hep SQ    Disposition:  Home w/Family           ___________________________________________________    Attending Physician: Lurlean Horns, MD

## 2016-10-21 NOTE — Procedures (Signed)
[facility]  *** FINAL REPORT ***    Name: Dylan Haney, Dylan Haney  MRN: [id number]    Outpatient  DOB: 13 Sep 1924  HIS Order #: [id number]  TRAKnet Visit #: [id number]  Date: 21 Oct 2016    TYPE OF TEST: Cerebrovascular Duplex    REASON FOR TEST  Syncope    Right Carotid:-             Proximal               Mid                 Distal  cm/s  Systolic  Diastolic  Systolic  Diastolic  Systolic  Diastolic  CCA:     40.156.0       8.0                            45.0       8.0  Bulb:  ECA:     67.0       0.0  ICA:     57.0      15.0       83.0      19.0       52.0      13.0  ICA/CCA:  1.3       1.9    ICA Stenosis: <50%    Right Vertebral:-  Finding: Antegrade  Sys:       27.0  Dia:        9.0    Right Subclavian: Normal    Left Carotid:-            Proximal                Mid                 Distal  cm/s  Systolic  Diastolic  Systolic  Diastolic  Systolic  Diastolic  CCA:     02.781.0      12.0                            52.0       9.0  Bulb:  ECA:     42.0       0.0  ICA:     51.0      10.0       63.0      17.0       51.0      10.0  ICA/CCA:  1.0       1.1    ICA Stenosis: <50%    Left Vertebral:-  Finding: Antegrade  Sys:       28.0  Dia:        8.0    Left Subclavian: Normal    INTERPRETATION/FINDINGS  PROCEDURE:  Evaluation of the extracranial cerebrovascular arteries  with ultrasound (B-mode imaging, pulsed Doppler, color Doppler).  Includes the common carotid, internal carotid, external carotid, and  vertebral arteries.    FINDINGS:    RIGHT- Internal carotid velocity is within normal limits.  There is  narrowing of the internal carotid flow channel on color Doppler  imaging and non-calcific density plaque on B-mode imaging, consistent  with less than 50 percent stenosis.  The common and external carotid  arteries are patent and without evidence of hemodynamically  significant stenosis.    LEFT- Internal carotid velocity is  within normal limits.  There is   narrowing of the internal carotid flow channel on color Doppler  imaging and non-calcific density plaque on B-mode imaging, consistent  with less than 50 percent stenosis.  The common and external carotid  arteries are patent and without evidence of hemodynamically  significant stenosis.    IMPRESSION: Findings are consistent with  less than 50% stenosis of  the right internal carotid and less than 50% stenosis of the left  internal carotid.  Vertebrals are patent with antegrade flow.    ADDITIONAL COMMENTS    I have personally reviewed the data relevant to the interpretation of  this  study.    TECHNOLOGIST: Virgina Organ, RVS, RCS  Signed: 10/21/2016 10:36 AM    PHYSICIAN: Roylene Reason., MD  Signed: 10/22/2016 06:37 AM

## 2016-10-22 LAB — EKG, 12 LEAD, INITIAL
Atrial Rate: 0 {beats}/min
Atrial Rate: 62 {beats}/min
Calculated R Axis: -39 degrees
Calculated R Axis: -43 degrees
Calculated T Axis: 133 degrees
Calculated T Axis: 41 degrees
Q-T Interval: 410 ms
Q-T Interval: 440 ms
QRS Duration: 132 ms
QRS Duration: 132 ms
QTC Calculation (Bezet): 475 ms
QTC Calculation (Bezet): 476 ms
Ventricular Rate: 70 {beats}/min
Ventricular Rate: 81 {beats}/min

## 2017-01-08 ENCOUNTER — Institutional Professional Consult (permissible substitution): Payer: Medicare HMO | Admitting: Pulmonary Disease

## 2017-03-11 ENCOUNTER — Encounter: Payer: Self-pay | Admitting: Pulmonary Disease

## 2017-03-11 ENCOUNTER — Ambulatory Visit (INDEPENDENT_AMBULATORY_CARE_PROVIDER_SITE_OTHER): Payer: Medicare HMO | Admitting: Pulmonary Disease

## 2017-03-11 VITALS — BP 98/58 | HR 89 | Ht 67.0 in | Wt 137.6 lb

## 2017-03-11 DIAGNOSIS — R0602 Shortness of breath: Secondary | ICD-10-CM | POA: Diagnosis not present

## 2017-03-11 DIAGNOSIS — R059 Cough, unspecified: Secondary | ICD-10-CM

## 2017-03-11 DIAGNOSIS — R05 Cough: Secondary | ICD-10-CM | POA: Diagnosis not present

## 2017-03-11 MED ORDER — IPRATROPIUM-ALBUTEROL 0.5-2.5 (3) MG/3ML IN SOLN: 3.0000 mL | Freq: Four times a day (QID) | RESPIRATORY_TRACT | 2 refills | Status: AC | PRN

## 2017-03-11 NOTE — Progress Notes (Addendum)
Justin Juarez    [id number]    March 15, 1924  Primary Care Physician:Jones, Letha CapePenny L, NP  Referring Physician: Iona HansenJones, Penny L, NP [address] [place], [place] [address]  Chief complaint: Consult for abnormal CT scan  HPI: 84 year old with history of atrial fibrillation, sick sinus syndrome status post pacemaker in September 2018, altered mental status.  He is being evaluated by neurology for recurrent spells of unresponsiveness.  This described as episodes occurring in the early morning where he is found sitting up, hard time verbalizing responding to commands.  This lasted 10-15 minutes and resolved spontaneously.  He got EEG and MRI brain that was unremarkable.  Has complaints of daily cough with chest congestion, white, yellow mucus.  Dyspnea with occasional wheezing.  He also reports choking on fluid and coughing every time he eats.  He has not been evaluated for aspiration.  Pets: No pets, birds, farm animals. 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 Exposures: No known exposures, no asbestos, mold, hot tub. Smoking history: 35-pack-year smoking history.  Quit in 1968. Travel History: Used to live in [place].  [place]oved to [place] to be closer to family.  Outpatient Encounter Medications as of 03/11/2017  Medication Sig  . albuterol (PROAIR HFA) 108 (90 Base) MCG/ACT inhaler TAKE 2 PUFFS BY MOUTH EVERY 6 HOURS AS NEEDED FOR WHEEZE  . aspirin EC 81 MG tablet Take 81 mg by mouth daily.  . Cholecalciferol (VITAMIN D3) 1000 units CAPS Take by mouth.  . folic acid (FOLVITE) 400 MCG tablet Take by mouth.  . metoprolol tartrate (LOPRESSOR) 25 MG tablet Take by mouth.   No facility-administered encounter medications on file as of 03/11/2017.     Allergies as of 03/11/2017  . (No Known Allergies)    Past Medical History:  Diagnosis Date  . Atrial fibrillation (HCC)   . Pacemaker    sept. 2018 placed    Past Surgical  History:  Procedure Laterality Date  . PACEMAKER INSERTION      Family History  Problem Relation Age of Onset  . Asthma Mother     Social History   Socioeconomic History  . Marital status: Widowed    Spouse name: Not on file  . Number of children: Not on file  . Years of education: Not on file  . Highest education level: Not on file  Social Needs  . Financial resource strain: Not on file  . Food insecurity - worry: Not on file  . Food insecurity - inability: Not on file  . Transportation needs - medical: Not on file  . Transportation needs - non-medical: Not on file  Occupational History  . Occupation: retired  Tobacco Use  . Smoking status: Former Smoker    Packs/day: 1.00    Years: 34.00    Pack years: 34.00    Types: Cigarettes    Last attempt to quit: 1968    Years since quitting: 51.1  . Smokeless tobacco: Never Used  Substance and Sexual Activity  . Alcohol use: No  . Drug use: No  . Sexual activity: Not on file  Other Topics Concern  . Not on file  Social History Narrative   Lives with son   bdrm on main floor, 3x wk goes upstairs to take a shower    Review of systems: Review of Systems  Constitutional: Negative for fever and chills.  HENT: Negative.   Eyes: Negative for  blurred vision.  Respiratory: as per HPI  Cardiovascular: Negative for chest pain and palpitations.  Gastrointestinal: Negative for vomiting, diarrhea, blood per rectum. Genitourinary: Negative for dysuria, urgency, frequency and hematuria.  Musculoskeletal: Negative for myalgias, back pain and joint pain.  Skin: Negative for itching and rash.  Neurological: Negative for dizziness, tremors, focal weakness, seizures and loss of consciousness.  Endo/Heme/Allergies: Negative for environmental allergies.  Psychiatric/Behavioral: Negative for depression, suicidal ideas and hallucinations.  All other systems reviewed and are negative.  Physical Exam: Blood pressure (!) 98/58, pulse 89,  height 5\' 7"  (1.702 m), weight 137 lb 9.6 oz (62.4 kg), SpO2 98 %. Gen:      No acute distress HEENT:  EOMI, sclera anicteric Neck:     No masses; no thyromegaly Lungs:    Clear to auscultation bilaterally; normal respiratory effort CV:         Regular rate and rhythm; no murmurs Abd:      + bowel sounds; soft, non-tender; no palpable masses, no distension Ext:    No edema; adequate peripheral perfusion Skin:      Warm and dry; no rash Neuro: alert and oriented x 3 Psych: normal mood and affect  Data Reviewed: CT chest 12/10/16-moderate bilateral pleural effusion, bilateral lower lobe consolidation/rounded atelectasis right greater than left with patchy atelectasis in the right middle lobe right upper lobe and lingula.  Calcified pleural plaques Mild enlargement of mediastinal lymph nodes.  Borderline prominence of pulmonary artery.  I have reviewed the images personally  EEG 09/20/16-normal EEG, no evidence of seizures MRI brain 08/29/16-atrophy, small vessel disease.  No acute intracranial findings.  2D echocardiogram 10/21/16 Mild LV dilatation, EF 35-40% with no regional wall motion abnormality Left atrium is severely dilated, right atrium severely dilated, moderate MR Right ventricle is normal in size and systolic function.  No evidence of pulmonary hypertension.  Assessment:  Evaluation for abnormal CT scan Scan shows bilateral effusions and heart failure.  In addition there was lower lobe consolidation and rounded atelectasis at the bases with calcified pleural plaques.  This may be secondary to asbestos exposure although I could not elicit a history of exposure.  We will get a follow-up CT without contrast to reevaluate  Will need to evaluate for COPD given his smoking history.  Schedule PFTs Start duo nebs 4 times a day and Mucinex for secretion.  I do not think he will be able to take inhalers reliably due to issues with coordination and dementia.  Suspected aspiration May have  chronic aspiration given his symptoms of choking while eating.  Schedule modified barium swallow for evaluation.  Dilated pulmonary artery No evidence of pulmonary hypertension on echocardiogram in 2018.  We will continue to monitor.  Plan/Recommendations: -CT Chest without contrast, pulmonary function test -Start duo nebs 4 times a day as needed, Mucinex -Speech therapy eval, modified barium swallow.  Chilton Greathouse MD [facility] Pager [phone] 03/11/2017, 4:00 PM  CC: Iona Hansen, NP

## 2017-03-11 NOTE — Patient Instructions (Signed)
We will start you on nebulizers, duo nebs 4 times a day as needed We will schedule you for CT chest without contrast and pulmonary function tests Continue using the Mucinex twice daily for clearance of secretion We will send you to speech therapy for evaluation of aspiration Follow-up in 1 month.

## 2017-03-12 ENCOUNTER — Telehealth: Payer: Self-pay | Admitting: Pulmonary Disease

## 2017-03-13 NOTE — Telephone Encounter (Signed)
dont know how this ended up in triage I am taking care of it I was waiting on PM to fish ov notes and they have now been faxed Tobe SosSally E Ottinger

## 2017-03-13 NOTE — Telephone Encounter (Signed)
Spoke with [name], they state the pt is not eligible for the procedure. I am not sure who handles this.

## 2017-03-14 ENCOUNTER — Inpatient Hospital Stay: Admission: RE | Admit: 2017-03-14 | Payer: Medicare HMO | Source: Ambulatory Visit

## 2017-03-14 ENCOUNTER — Ambulatory Visit (INDEPENDENT_AMBULATORY_CARE_PROVIDER_SITE_OTHER): Payer: Medicare HMO | Admitting: Pulmonary Disease

## 2017-03-14 DIAGNOSIS — R0602 Shortness of breath: Secondary | ICD-10-CM

## 2017-03-14 NOTE — Progress Notes (Signed)
PFT was attempted today but could not be completed due to the pt's inability to perform the proper technique of the tests.

## 2017-03-18 ENCOUNTER — Telehealth: Payer: Self-pay | Admitting: Pulmonary Disease

## 2017-03-18 NOTE — Telephone Encounter (Signed)
I have called [employer] and given them more clinical info on this pt to see if we can get it authorized instead of dr Isaiah Sergemannam having to call them Justin Juarez

## 2017-03-19 NOTE — Telephone Encounter (Signed)
Received auth from [employer] [id number] lm for son Glenford BayleyDale Skowron to call back and reschedule the ct Tobe SosSally E Ottinger

## 2017-03-20 ENCOUNTER — Ambulatory Visit: Payer: Medicare HMO | Admitting: Neurology

## 2017-03-20 NOTE — Telephone Encounter (Signed)
lmtcb x2 for pt's son, Amada JupiterDale

## 2017-03-21 ENCOUNTER — Inpatient Hospital Stay: Admission: RE | Admit: 2017-03-21 | Payer: Medicare HMO | Source: Ambulatory Visit

## 2017-03-26 NOTE — Telephone Encounter (Signed)
ATC pt, no answer. Left message for pt to call back.  

## 2017-03-27 NOTE — Telephone Encounter (Signed)
Neither myself or Stacy@CT  have been able to reach this pt's sone who is our contact person I guess we need to close it and hope he calls someon back at some point to set this appt back up Tobe SosSally E Ottinger

## 2017-03-27 NOTE — Telephone Encounter (Signed)
Libby, please advise if you were able to reach pt's son to reschedule CT scan. Thanks!

## 2017-03-28 ENCOUNTER — Encounter: Payer: Self-pay | Admitting: Emergency Medicine

## 2017-03-28 NOTE — Telephone Encounter (Signed)
lmtcb for Justin JupiterDale, pts son, to call back to reschedule CT chest. Due to several unsuccessful attempts to reach pt a letter has been sent.   Will forward to PM and PCC's as FYI.

## 2017-03-29 NOTE — Telephone Encounter (Signed)
Noted. Thanks.

## 2017-04-08 ENCOUNTER — Encounter: Payer: Self-pay | Admitting: Pulmonary Disease

## 2017-04-08 ENCOUNTER — Ambulatory Visit (INDEPENDENT_AMBULATORY_CARE_PROVIDER_SITE_OTHER)
Admission: RE | Admit: 2017-04-08 | Discharge: 2017-04-08 | Disposition: A | Discharge status: Home / Self Care | Payer: Medicare HMO | Source: Ambulatory Visit | Attending: Pulmonary Disease | Admitting: Pulmonary Disease

## 2017-04-08 ENCOUNTER — Ambulatory Visit (INDEPENDENT_AMBULATORY_CARE_PROVIDER_SITE_OTHER): Payer: Medicare HMO | Admitting: Pulmonary Disease

## 2017-04-08 DIAGNOSIS — R0602 Shortness of breath: Secondary | ICD-10-CM

## 2017-04-08 DIAGNOSIS — R918 Other nonspecific abnormal finding of lung field: Secondary | ICD-10-CM | POA: Diagnosis not present

## 2017-04-08 NOTE — Patient Instructions (Signed)
I am glad that your breathing is improved Your CT scan is stable compared to prior.  We will keep a watch on that We will order a follow-up CT without contrast in about 8-10 months Return to clinic after CT scan.

## 2017-04-08 NOTE — Progress Notes (Signed)
Justin Juarez    [id number]    Aug 29, 1924  Primary Care Physician:Jones, Letha CapePenny L, NP  Referring Physician: Iona HansenJones, Penny L, NP [address] [place], [place] [address]  Chief complaint: Follow up for abnormal CT scan  HPI: 84 year old with history of atrial fibrillation, sick sinus syndrome status post pacemaker in September 2018, altered mental status.  He is being evaluated by neurology for recurrent spells of unresponsiveness.  This described as episodes occurring in the early morning where he is found sitting up, hard time verbalizing responding to commands.  This lasted 10-15 minutes and resolved spontaneously.  He got EEG and MRI brain that was unremarkable.  Has complaints of daily cough with chest congestion, white, yellow mucus.  Dyspnea with occasional wheezing.  He also reports choking on fluid and coughing every time he eats.  He has not been evaluated for aspiration.  Pets: No pets, birds, farm animals. 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 Exposures: No known exposures, no asbestos, mold, hot tub. Smoking history: 35-pack-year smoking history.  Quit in 1968. Travel History: Used to live in [place].  [place]oved to [place] to be closer to family.  Interim history: He has been started on duo nebs at last visit and feels that it is helping.  Continues to have dyspnea while walking.  Episodes of cough with chest congestion is at baseline. Reports that choking on food this improving and he has not had any more episodes of unresponsiveness.  Outpatient Encounter Medications as of 04/08/2017  Medication Sig  . albuterol (PROAIR HFA) 108 (90 Base) MCG/ACT inhaler TAKE 2 PUFFS BY MOUTH EVERY 6 HOURS AS NEEDED FOR WHEEZE  . aspirin EC 81 MG tablet Take 81 mg by mouth daily.  . Cholecalciferol (VITAMIN D3) 1000 units CAPS Take by mouth.  . folic acid (FOLVITE) 400 MCG tablet Take by mouth.  Marland Kitchen. ipratropium-albuterol (DUONEB)  0.5-2.5 (3) MG/3ML SOLN Take 3 mLs by nebulization every 6 (six) hours as needed. Dx: J44.9  . metoprolol tartrate (LOPRESSOR) 25 MG tablet Take by mouth.   No facility-administered encounter medications on file as of 04/08/2017.     Allergies as of 04/08/2017  . (No Known Allergies)    Past Medical History:  Diagnosis Date  . Atrial fibrillation (HCC)   . Pacemaker    sept. 2018 placed    Past Surgical History:  Procedure Laterality Date  . PACEMAKER INSERTION      Family History  Problem Relation Age of Onset  . Asthma Mother     Social History   Socioeconomic History  . Marital status: Widowed    Spouse name: Not on file  . Number of children: Not on file  . Years of education: Not on file  . Highest education level: Not on file  Social Needs  . Financial resource strain: Not on file  . Food insecurity - worry: Not on file  . Food insecurity - inability: Not on file  . Transportation needs - medical: Not on file  . Transportation needs - non-medical: Not on file  Occupational History  . Occupation: retired  Tobacco Use  . Smoking status: Former Smoker    Packs/day: 1.00    Years: 34.00    Pack years: 34.00    Types: Cigarettes    Last attempt to quit: 1968    Years since quitting: 51.2  . Smokeless tobacco: Never Used  Substance and  Sexual Activity  . Alcohol use: No  . Drug use: No  . Sexual activity: Not on file  Other Topics Concern  . Not on file  Social History Narrative   Lives with son   bdrm on main floor, 3x wk goes upstairs to take a shower    Review of systems: Review of Systems  Constitutional: Negative for fever and chills.  HENT: Negative.   Eyes: Negative for blurred vision.  Respiratory: as per HPI  Cardiovascular: Negative for chest pain and palpitations.  Gastrointestinal: Negative for vomiting, diarrhea, blood per rectum. Genitourinary: Negative for dysuria, urgency, frequency and hematuria.  Musculoskeletal: Negative for  myalgias, back pain and joint pain.  Skin: Negative for itching and rash.  Neurological: Negative for dizziness, tremors, focal weakness, seizures and loss of consciousness.  Endo/Heme/Allergies: Negative for environmental allergies.  Psychiatric/Behavioral: Negative for depression, suicidal ideas and hallucinations.  All other systems reviewed and are negative.  Physical Exam: Blood pressure 98/62, pulse 87, height 5\' 8"  (1.727 m), weight 136 lb (61.7 kg), SpO2 93 %. Gen:      No acute distress HEENT:  EOMI, sclera anicteric Neck:     No masses; no thyromegaly Lungs:    Clear to auscultation bilaterally; normal respiratory effort CV:         Regular rate and rhythm; no murmurs Abd:      + bowel sounds; soft, non-tender; no palpable masses, no distension Ext:    No edema; adequate peripheral perfusion Skin:      Warm and dry; no rash Neuro: alert and oriented x 3 Psych: normal mood and affect  Data Reviewed: CT chest 12/10/16-moderate bilateral pleural effusion, bilateral lower lobe consolidation/rounded atelectasis right greater than left with patchy atelectasis in the right middle lobe right upper lobe and lingula.  Calcified pleural plaques Mild enlargement of mediastinal lymph nodes.  Borderline prominence of pulmonary artery.   CT chest 04/08/17-bilateral effusion with loculation, rounded atelectasis, stable bilateral calcified pleural plaques.  Mild mediastinal lymphadenopathy. I have reviewed the images personally.  EEG 09/20/16-normal EEG, no evidence of seizures MRI brain 08/29/16-atrophy, small vessel disease.  No acute intracranial findings.  2D echocardiogram 10/21/16 Mild LV dilatation, EF 35-40% with no regional wall motion abnormality Left atrium is severely dilated, right atrium severely dilated, moderate MR Right ventricle is normal in size and systolic function.  No evidence of pulmonary hypertension.  PFTs-unable to complete.  Assessment:  Evaluation for abnormal  CT scan, likely asbestos lung disease. Follow-up CT scan reviewed which shows stable  lower lobe consolidation and rounded atelectasis at the bases with calcified pleural plaques.  This may be secondary to asbestos exposure although I could not elicit a history of exposure.  Continue to monitor this with a follow-up CT scan later this year.  Emphysema Unable to complete PFTs.  Symptoms have improved with duo nebs and will continue the same. I do not think he will be able to take inhalers reliably due to issues with coordination and dementia.  Suspected aspiration May have chronic aspiration given his symptoms of choking while eating.  Symptoms appear to have improved.  We are unable to get a modified barium swallow due to issues with transportation.  We will continue to monitor symptoms.  Dilated pulmonary artery No evidence of pulmonary hypertension on echocardiogram in 2018.  We will continue to monitor.  Plan/Recommendations: - Follow-up CT chest - Continue duo nebs 4 times a day as needed, Mucinex  Chilton Greathouse MD [facility] Pager [phone] 04/08/2017, 4:42 PM  CC: Justin Hansen, NP

## 2017-05-25 ENCOUNTER — Emergency Department: Admit: 2017-05-25 | Payer: MEDICARE | Primary: Family Medicine

## 2017-05-25 ENCOUNTER — Inpatient Hospital Stay: Admit: 2017-05-25 | Payer: MEDICARE | Primary: Family Medicine

## 2017-05-25 ENCOUNTER — Inpatient Hospital Stay
Admit: 2017-05-25 | Discharge: 2017-05-25 | Disposition: A | Discharge status: Other Acute Inpatient Hospital | Payer: MEDICARE | Attending: Emergency Medicine

## 2017-05-25 ENCOUNTER — Inpatient Hospital Stay
Admit: 2017-05-25 | Discharge: 2017-05-29 | Disposition: E | Payer: MEDICARE | Attending: Internal Medicine | Admitting: Internal Medicine

## 2017-05-25 DIAGNOSIS — I469 Cardiac arrest, cause unspecified: Principal | ICD-10-CM

## 2017-05-25 LAB — METABOLIC PANEL, COMPREHENSIVE
A-G Ratio: 0.8 — ABNORMAL LOW (ref 1.1–2.2)
ALT (SGPT): 18 U/L (ref 12–78)
AST (SGOT): 39 U/L — ABNORMAL HIGH (ref 15–37)
Albumin: 3.3 g/dL — ABNORMAL LOW (ref 3.5–5.0)
Alk. phosphatase: 128 U/L — ABNORMAL HIGH (ref 45–117)
Anion gap: 10 mmol/L (ref 5–15)
BUN/Creatinine ratio: 23 — ABNORMAL HIGH (ref 12–20)
BUN: 37 MG/DL — ABNORMAL HIGH (ref 6–20)
Bilirubin, total: 1.1 MG/DL — ABNORMAL HIGH (ref 0.2–1.0)
CO2: 22 mmol/L (ref 21–32)
Calcium: 8.7 MG/DL (ref 8.5–10.1)
Chloride: 109 mmol/L — ABNORMAL HIGH (ref 97–108)
Creatinine: 1.58 MG/DL — ABNORMAL HIGH (ref 0.70–1.30)
GFR est AA: 50 mL/min/{1.73_m2} — ABNORMAL LOW (ref >60)
GFR est non-AA: 41 mL/min/{1.73_m2} — ABNORMAL LOW (ref >60)
Globulin: 4 g/dL (ref 2.0–4.0)
Glucose: 124 mg/dL — ABNORMAL HIGH (ref 65–100)
Potassium: 5.3 mmol/L — ABNORMAL HIGH (ref 3.5–5.1)
Protein, total: 7.3 g/dL (ref 6.4–8.2)
Sodium: 141 mmol/L (ref 136–145)

## 2017-05-25 LAB — URINALYSIS W/ REFLEX CULTURE
Bacteria: NEGATIVE /hpf
Bilirubin: NEGATIVE
Blood: NEGATIVE
Glucose: NEGATIVE mg/dL
Ketone: NEGATIVE mg/dL
Leukocyte Esterase: NEGATIVE
Nitrites: NEGATIVE
Specific gravity: 1.02 (ref 1.003–1.030)
Urobilinogen: 1 EU/dL (ref 0.2–1.0)
pH (UA): 5.5 (ref 5.0–8.0)

## 2017-05-25 LAB — PTT: aPTT: 30.5 s (ref 22.1–32.0)

## 2017-05-25 LAB — POC G3 - PUL
Base deficit (POC): 13 mmol/L
FIO2 (POC): 100 %
HCO3 (POC): 15.9 MMOL/L — ABNORMAL LOW (ref 22–26)
PEEP/CPAP (POC): 7 cmH2O
Set Rate: 31 {beats}/min
Tidal volume: 512 ml
Total resp. rate: 31
pCO2 (POC): 43.1 MMHG (ref 35.0–45.0)
pH (POC): 7.173 — CL (ref 7.35–7.45)
pO2 (POC): 477 MMHG — ABNORMAL HIGH (ref 80–100)
sO2 (POC): 100 % — ABNORMAL HIGH (ref 92–97)

## 2017-05-25 LAB — LACTIC ACID: Lactic acid: 2.7 MMOL/L — CR (ref 0.4–2.0)

## 2017-05-25 LAB — CBC WITH AUTOMATED DIFF
ABS. BASOPHILS: 0 10*3/uL (ref 0.0–0.1)
ABS. EOSINOPHILS: 0 10*3/uL (ref 0.0–0.4)
ABS. IMM. GRANS.: 0.1 10*3/uL — ABNORMAL HIGH (ref 0.00–0.04)
ABS. LYMPHOCYTES: 1.1 10*3/uL (ref 0.8–3.5)
ABS. MONOCYTES: 0.4 10*3/uL (ref 0.0–1.0)
ABS. NEUTROPHILS: 7.9 10*3/uL (ref 1.8–8.0)
ABSOLUTE NRBC: 0 10*3/uL (ref 0.00–0.01)
BASOPHILS: 0 % (ref 0–1)
EOSINOPHILS: 0 % (ref 0–7)
HCT: 42.6 % (ref 36.6–50.3)
HGB: 13.1 g/dL (ref 12.1–17.0)
IMMATURE GRANULOCYTES: 1 % — ABNORMAL HIGH (ref 0.0–0.5)
LYMPHOCYTES: 11 % — ABNORMAL LOW (ref 12–49)
MCH: 32.4 PG (ref 26.0–34.0)
MCHC: 30.8 g/dL (ref 30.0–36.5)
MCV: 105.4 FL — ABNORMAL HIGH (ref 80.0–99.0)
MONOCYTES: 4 % — ABNORMAL LOW (ref 5–13)
MPV: 9.7 FL (ref 8.9–12.9)
NEUTROPHILS: 84 % — ABNORMAL HIGH (ref 32–75)
NRBC: 0 PER 100 WBC
PLATELET: 205 10*3/uL (ref 150–400)
RBC: 4.04 M/uL — ABNORMAL LOW (ref 4.10–5.70)
RDW: 17.4 % — ABNORMAL HIGH (ref 11.5–14.5)
WBC: 9.5 10*3/uL (ref 4.1–11.1)

## 2017-05-25 LAB — POC CHEM8
Anion gap (POC): 16 mmol/L (ref 10–20)
BUN (POC): 50 mg/dL — ABNORMAL HIGH (ref 9–20)
CO2 (POC): 22 mmol/L (ref 21–32)
Calcium, ionized (POC): 1.08 mmol/L — ABNORMAL LOW (ref 1.12–1.32)
Chloride (POC): 108 mmol/L — ABNORMAL HIGH (ref 98–107)
Creatinine (POC): 1.3 mg/dL (ref 0.6–1.3)
GFRAA, POC: >60 mL/min/{1.73_m2} (ref >60)
GFRNA, POC: 52 mL/min/{1.73_m2} — ABNORMAL LOW (ref >60)
Glucose (POC): 156 mg/dL — ABNORMAL HIGH (ref 65–100)
Hematocrit (POC): 41 % (ref 36.6–50.3)
Potassium (POC): 5 mmol/L (ref 3.5–5.1)
Sodium (POC): 140 mmol/L (ref 136–145)

## 2017-05-25 LAB — NT-PRO BNP: NT pro-BNP: 27091 PG/ML — ABNORMAL HIGH (ref <450)

## 2017-05-25 LAB — TROPONIN I: Troponin-I, Qt.: 0.22 ng/mL — ABNORMAL HIGH (ref <0.05)

## 2017-05-25 LAB — SAMPLES BEING HELD

## 2017-05-25 LAB — POC TROPONIN-I: Troponin-I (POC): <0.04 ng/mL (ref 0.00–0.08)

## 2017-05-25 LAB — PROTHROMBIN TIME + INR
INR: 1.3 — ABNORMAL HIGH (ref 0.9–1.1)
Prothrombin time: 13.3 s — ABNORMAL HIGH (ref 9.0–11.1)

## 2017-05-25 MED ORDER — LORAZEPAM 2 MG/ML IJ SOLN
2 mg/mL | INTRAMUSCULAR | Status: AC
Start: 2017-05-25 — End: 2017-05-25

## 2017-05-25 MED ORDER — PROPOFOL INFUSION
10 mg/mL | INTRAVENOUS | Status: DC
Start: 2017-05-25 — End: 2017-05-26
  Administered 2017-05-25 – 2017-05-26 (×3): via INTRAVENOUS

## 2017-05-25 MED ORDER — PHARMACY VANCOMYCIN NOTE
Status: DC
Start: 2017-05-25 — End: 2017-05-26

## 2017-05-25 MED ORDER — EPINEPHRINE 0.1 MG/ML SYRINGE
0.1 mg/mL | INTRAMUSCULAR | Status: AC
Start: 2017-05-25 — End: 2017-05-25
  Administered 2017-05-25: 15:00:00 via INTRAVENOUS

## 2017-05-25 MED ORDER — FUROSEMIDE 10 MG/ML IJ SOLN
10 mg/mL | Freq: Two times a day (BID) | INTRAMUSCULAR | Status: DC
Start: 2017-05-25 — End: 2017-05-26

## 2017-05-25 MED ORDER — PIPERACILLIN-TAZOBACTAM 3.375 GRAM IV SOLR
3.375 gram | Freq: Three times a day (TID) | INTRAVENOUS | Status: DC
Start: 2017-05-25 — End: 2017-05-26
  Administered 2017-05-26 (×3): via INTRAVENOUS

## 2017-05-25 MED ORDER — FUROSEMIDE 10 MG/ML IJ SOLN
10 mg/mL | INTRAMUSCULAR | Status: AC
Start: 2017-05-25 — End: 2017-05-25
  Administered 2017-05-25: 18:00:00 via INTRAVENOUS

## 2017-05-25 MED ORDER — PROPOFOL INFUSION
10 mg/mL | INTRAVENOUS | Status: AC
Start: 2017-05-25 — End: 2017-05-25
  Administered 2017-05-25: 18:00:00 via INTRAVENOUS

## 2017-05-25 MED ORDER — PHENYLEPHRINE 10 MG/ML INJECTION
10 mg/mL | INTRAMUSCULAR | Status: DC
Start: 2017-05-25 — End: 2017-05-25

## 2017-05-25 MED ORDER — IPRATROPIUM-ALBUTEROL 2.5 MG-0.5 MG/3 ML NEB SOLUTION
2.5 mg-0.5 mg/3 ml | RESPIRATORY_TRACT | Status: DC
Start: 2017-05-25 — End: 2017-05-26
  Administered 2017-05-26 (×3): via RESPIRATORY_TRACT

## 2017-05-25 MED ORDER — VANCOMYCIN IN 0.9% SODIUM CHLORIDE 1.5 G/500 ML IV
1.5 g/500 mL | Freq: Once | INTRAVENOUS | Status: AC
Start: 2017-05-25 — End: 2017-05-25
  Administered 2017-05-25: 22:00:00 via INTRAVENOUS

## 2017-05-25 MED ORDER — SODIUM CHLORIDE 0.9 % IJ SYRG
Freq: Three times a day (TID) | INTRAMUSCULAR | Status: DC
Start: 2017-05-25 — End: 2017-05-27
  Administered 2017-05-25 – 2017-05-27 (×6): via INTRAVENOUS

## 2017-05-25 MED ORDER — SODIUM CHLORIDE 0.9 % IJ SYRG
INTRAMUSCULAR | Status: DC | PRN
Start: 2017-05-25 — End: 2017-05-27
  Administered 2017-05-26: 23:00:00 via INTRAVENOUS

## 2017-05-25 MED ORDER — SODIUM CHLORIDE 0.9% BOLUS IV
0.9 % | INTRAVENOUS | Status: AC
Start: 2017-05-25 — End: 2017-05-25
  Administered 2017-05-25: 15:00:00 via INTRAVENOUS

## 2017-05-25 MED ORDER — PIPERACILLIN-TAZOBACTAM 3.375 GRAM IV SOLR
3.375 gram | INTRAVENOUS | Status: AC
Start: 2017-05-25 — End: 2017-05-25
  Administered 2017-05-25: 21:00:00 via INTRAVENOUS

## 2017-05-25 MED ORDER — LORAZEPAM 2 MG/ML IJ SOLN
2 mg/mL | INTRAMUSCULAR | Status: DC | PRN
Start: 2017-05-25 — End: 2017-05-27
  Administered 2017-05-26: 03:00:00 via INTRAVENOUS

## 2017-05-25 MED ORDER — ASPIRIN 81 MG CHEWABLE TAB
81 mg | Freq: Every day | ORAL | Status: DC
Start: 2017-05-25 — End: 2017-05-26
  Administered 2017-05-26: 13:00:00 via ORAL

## 2017-05-25 MED ORDER — PANTOPRAZOLE 40 MG IV SOLR
40 mg | Freq: Every day | INTRAVENOUS | Status: DC
Start: 2017-05-25 — End: 2017-05-26
  Administered 2017-05-26: 13:00:00 via INTRAVENOUS

## 2017-05-25 MED ORDER — ADV ADDAPTOR
750 mg | Status: DC
Start: 2017-05-25 — End: 2017-05-26

## 2017-05-25 MED FILL — PHENYLEPHRINE 10 MG/ML INJECTION: 10 mg/mL | INTRAMUSCULAR | Qty: 3

## 2017-05-25 MED FILL — IPRATROPIUM-ALBUTEROL 2.5 MG-0.5 MG/3 ML NEB SOLUTION: 2.5 mg-0.5 mg/3 ml | RESPIRATORY_TRACT | Qty: 3

## 2017-05-25 MED FILL — MONOJECT PREFILL ADVANCED 0.9 % SODIUM CHLORIDE INJECTION SYRINGE: INTRAMUSCULAR | Qty: 40

## 2017-05-25 MED FILL — PIPERACILLIN-TAZOBACTAM 3.375 GRAM IV SOLR: 3.375 gram | INTRAVENOUS | Qty: 3.38

## 2017-05-25 MED FILL — EPINEPHRINE 0.1 MG/ML SYRINGE: 0.1 mg/mL | INTRAMUSCULAR | Qty: 10

## 2017-05-25 MED FILL — VANCOMYCIN IN 0.9% SODIUM CHLORIDE 1.5 G/500 ML IV: 1.5 g/500 mL | INTRAVENOUS | Qty: 500

## 2017-05-25 MED FILL — FUROSEMIDE 10 MG/ML IJ SOLN: 10 mg/mL | INTRAMUSCULAR | Qty: 8

## 2017-05-25 MED FILL — PHARMACY VANCOMYCIN NOTE: Qty: 1

## 2017-05-25 MED FILL — SODIUM CHLORIDE 0.9 % IV: INTRAVENOUS | Qty: 1000

## 2017-05-25 MED FILL — DIPRIVAN 10 MG/ML INTRAVENOUS EMULSION: 10 mg/mL | INTRAVENOUS | Qty: 100

## 2017-05-25 NOTE — Other (Signed)
TRANSFER - OUT REPORT:    Verbal report given to Faith, RN(name) on Dylan Haney  being transferred to CCU 23(unit) for routine progression of care       Report consisted of patient???s Situation, Background, Assessment and   Recommendations(SBAR).     Information from the following report(s) SBAR and Kardex was reviewed with the receiving nurse.    Lines:   Peripheral IV 05/15/2017 Left Antecubital (Active)   Site Assessment Clean, dry, & intact 05/08/2017 12:34 PM   Phlebitis Assessment 0 05/26/2017 12:34 PM   Infiltration Assessment 0 05/07/2017 12:34 PM   Dressing Type Transparent 05/14/2017 12:34 PM   Hub Color/Line Status Flushed 05/02/2017 12:34 PM        Opportunity for questions and clarification was provided.      Patient transported with:   Registered Nurse

## 2017-05-25 NOTE — Progress Notes (Signed)
Clinical Pharmacy Note - Extended Infusion Piperacillin/Tazobactam Dosing    This patient has been ordered piperacillin/tazobactam at 3.375 g IV every 6 hours. P&T protocol allows automatic substitution to extended infusion piperacillin/tazobactam and dose adjustment based on indication and renal function (adjustment required if creatinine clearance < 20 mL/min).    Indication: sepsis    CrCl: 26 mL/min    Per P&T protocol, the piperacillin/tazobactam dosing will be adjusted to 3.375 g IV every 8 hours (each dose will be infused over 4 hours).    Please call pharmacy with any questions.

## 2017-05-25 NOTE — Progress Notes (Signed)
1600 Primary Nurse Alfredo Martinez and Kyla Balzarine RN, RN performed a dual skin assessment on this patient Impairment noted- see wound doc flow sheet  Braden score is 12    Patient resting on Total Care Sport bed (burgundy mattress).    Pt arrived to unit with non blanchable area on R heel, tear and erythema to R elbow. Pt has ecchymosis bilaterally to both arms, and varicosities to both legs. Lesion to posterior R lower leg.

## 2017-05-25 NOTE — Progress Notes (Addendum)
[employer] rep Jarold Motto was called by me to check his pacemaker  He sent me report from [employer] today  His pacemaker was implanted 10/24/2016  Last in clinic check Dec 03 2016  VVI CLS mode at 70 bpm    No VT noted  98% V pacing  Battery 95% left    Addendum:  Thayer Ohm told me this check came in at 3 am so this is before his PEA arrest  I asked him to come check it in CCU    Addendum:  [employer] rep informed that there was no VT or VF after he checked device in CCU tonight

## 2017-05-25 NOTE — ED Triage Notes (Addendum)
Pt was out driving, pulled over to vomit Family drove him to [place], he arrested in the car, CPR started by [employer] when they got a pulse back. They gave hiim  3 rounds of epinephrine and then pt was intubated and transferred to ER. Pt arrives intubated

## 2017-05-25 NOTE — Progress Notes (Addendum)
1600 TRANSFER - IN REPORT:    Verbal report received from Cristal, RN(name) on Dylan Haney  being received from ED(unit) for routine progression of care      Report consisted of patient???s Situation, Background, Assessment and   Recommendations(SBAR).     Information from the following report(s) SBAR, Kardex, ED Summary, Procedure Summary, Intake/Output, MAR, Recent Results and Cardiac Rhythm paced was reviewed with the receiving nurse.    Opportunity for questions and clarification was provided.      Assessment completed upon patient???s arrival to unit and care assumed.     OGT at 40cm upon assessment - advanced to 65cm & KUB ordered to check for placement    1630 Pt grimacing, BP stable - spoke w/ Dr. Noel Gerold - orders received to restart propofol but to start very slowly  1830 Called x-ray to make sure they were still coming for stat KUB that was ordered - states they are on their way  1840 Spoke w/ pts daughter Dylan Haney - plans to arrive from [facility] around 0200 - sent request for [employer] room sent for her  1930 Bedside and Verbal shift change report given to Waynetta Sandy, Charity fundraiser (Cabin crew) by Rushie Goltz, RN (offgoing nurse). Report included the following information SBAR, Kardex, Intake/Output, MAR, Recent Results and Cardiac Rhythm paced.

## 2017-05-25 NOTE — Progress Notes (Signed)
Pharmacist Note - Vancomycin Dosing  Consult provided for this 84 y.o. male for indication of possible aspiration s/p cardiac arrest.  Antibiotic regimen(s): Vancomycin, zosyn    Recent Labs     06/07/2017  1416   WBC 9.5   CREA 1.58*   BUN 37*     Frequency of BMP: every day through 4/30  Height: 170.2 cm  Weight: 60 kg  Est CrCl: 26 ml/min; UO: n/a ml/kg/hr  Temp (24hrs), Avg:97.7 ??F (36.5 ??C), Min:97.5 ??F (36.4 ??C), Max:97.9 ??F (36.6 ??C)    Cultures:  4/27 blood: in process    Goal trough = 15 - 20 mcg/mL    Therapy will be initiated with a loading dose of 1500 mg IV x 1 to be followed by a maintenance dose of 750 mg IV every 24 hours.  Pharmacy to follow patient daily and order levels / make dose adjustments as appropriate.

## 2017-05-25 NOTE — Progress Notes (Addendum)
Bedside and Verbal shift change report given to Beth (Cabin crew) by Rushie Goltz (offgoing nurse). Report included the following information SBAR, Kardex, Intake/Output, MAR, Accordion, Recent Results, Cardiac Rhythm -Paced and Alarm Parameters .     2000 - Assumed care. Family at bedside. Plan of care reviewed.  2100 - [employer] rep at bedside to interrogate PPM.  2300 - Pt restless, Ativan given.  0200 - Daughter, Eunice Blase, arrived from out of town to see pt. Discussed plan of care. Daughter wants to discuss w/ MD in AM, continue no escalation of care at this time.  Daughter to stay at [facility].   0400 - AM labs drawn.    Bedside and Verbal shift change report given to Faith (Cabin crew) by Waynetta Sandy (offgoing nurse). Report included the following information SBAR, Kardex, Intake/Output, MAR, Accordion, Recent Results, Cardiac Rhythm -Paced and Alarm Parameters .

## 2017-05-25 NOTE — ED Provider Notes (Signed)
84 y.o. male with past medical history significant for Alcohol abuse, A.fib, Cardiomyopathy EF 35-40%, Osteoporosis, MI, and Skin CA who presents via EMS from [facility] ED with chief complaint of s/p cardiac arrest. Per son, the patient was in the car going to [facility] ED this morning for nausea and an episode of vomiting when he became short of breath and unresponsive. Upon arrival at [facility] ED patient was pulseless, pupils dilated and fixed, and no respiratory effort, CPR was initiated, given 4 rounds epi, and patient was resuscitated after 15 minutes. Patient was intubated and transferred to [facility] ED as possible code ICE. Patient presents to [facility] ED today intubated, and cardiologist Dr. Loma Newton made aware of patient's arrival.     Note written by Regis Bill, Scribe, as dictated by Christella Noa, MD 1:25 PM      The history is provided by the EMS personnel and medical records (Dr. Ladona Ridgel).        Past Medical History:   Diagnosis Date   ??? Alcohol abuse    ??? Arrhythmia     A-Fib   ??? Arthritis     osteoporosis   ??? Cancer (HCC)     skin cancer basal cell   ??? Cardiomyopathy (HCC) 09/11/2012    A.  Echo (09/11/12):  EF 35-40% with mod GHK.  Mildly dil RV.  Mod dil RA/LA.  Mild MR.  Mod-severe TR.  Mod PR.  PASP 60.   ??? Hearing aid worn 08/3015   ??? Heart murmur    ??? Ill-defined condition     seasonal allergies., fatty liver, alcoholism   ??? Ill-defined condition     a-fib   ??? Liver disease     fatty liver   ??? MI (myocardial infarction) (HCC)     states 40 years ago   ??? Reynolds syndrome [facility])        Past Surgical History:   Procedure Laterality Date   ??? HX ORTHOPAEDIC  1960s    index finger laceration repaired   ??? HX PACEMAKER           Family History:   Problem Relation Age of Onset   ??? Lung Disease Mother    ??? Cancer Father        Social History     Socioeconomic History   ??? Marital status: WIDOWED     Spouse name: Not on file   ??? Number of children: Not on file   ??? Years of education: Not on file    ??? Highest education level: Not on file   Occupational History   ??? Not on file   Social Needs   ??? Financial resource strain: Not on file   ??? Food insecurity:     Worry: Not on file     Inability: Not on file   ??? Transportation needs:     Medical: Not on file     Non-medical: Not on file   Tobacco Use   ??? Smoking status: Former Smoker     Packs/day: 1.00     Years: 50.00     Pack years: 50.00     Last attempt to quit: 07/22/1992     Years since quitting: 24.8   ??? Smokeless tobacco: Never Used   Substance and Sexual Activity   ??? Alcohol use: Yes     Alcohol/week: 0.0 oz     Comment: States he drinks 2 small glasses of Vodka a week   ??? Drug use: No   ???  Sexual activity: Not on file   Lifestyle   ??? Physical activity:     Days per week: Not on file     Minutes per session: Not on file   ??? Stress: Not on file   Relationships   ??? Social connections:     Talks on phone: Not on file     Gets together: Not on file     Attends religious service: Not on file     Active member of club or organization: Not on file     Attends meetings of clubs or organizations: Not on file     Relationship status: Not on file   ??? Intimate partner violence:     Fear of current or ex partner: Not on file     Emotionally abused: Not on file     Physically abused: Not on file     Forced sexual activity: Not on file   Other Topics Concern   ??? Not on file   Social History Narrative   ??? Not on file         ALLERGIES: Patient has no known allergies.    Review of Systems   Unable to perform ROS: Intubated             Physical Exam   Constitutional: He is intubated.   HENT:   Head: Normocephalic and atraumatic.   Eyes: No scleral icterus.   Neck: No tracheal deviation present.   Cardiovascular: Normal rate, regular rhythm and intact distal pulses. Exam reveals no gallop and no friction rub.   No murmur heard.  Pulmonary/Chest: He is intubated. He has no wheezes.   Bibasilar rales   Abdominal: Soft.   Musculoskeletal:   Posturing    Neurological: He is unresponsive.   Skin: Skin is dry. No rash noted. There is pallor.   Nursing note and vitals reviewed.  Note written by Regis Bill, Scribe, as dictated by Christella Noa, MD 1:25 PM       MDM  Number of Diagnoses or Management Options  Acute pulmonary edema [facility]):   Cardiac arrest [facility]):   Syncope and collapse:      Amount and/or Complexity of Data Reviewed  Clinical lab tests: ordered and reviewed  Tests in the radiology section of CPT??: ordered and reviewed  Tests in the medicine section of CPT??: ordered and reviewed  Discussion of test results with the performing providers: yes  Obtain history from someone other than the patient: yes  Discuss the patient with other providers: yes           Procedures  PROGRESS NOTE:  2:08 PM Spoke with Dr. Loma Newton, patient is not code ICE candidate. Will admit to hospital service, and give lasix IV. Freida Busman will see patient in consultation.      Hospitalist TigerText for Admission  2:19 PM    ED Room Number: ER24/24  Patient Name and age:  Dylan Haney 84 y.o.  male  Working Diagnosis:   1. Syncope and collapse    2. Cardiac arrest (HCC)    3. Acute pulmonary edema (HCC)      Readmission: no  Isolation Requirements:  no  Recommended Level of Care:  ICU  Code Status:  full  Other:      ED EKG interpretation:  Rhythm: Ventricular paced; and regular . Rate (approx.): 85 bpm; No acute changes.  Note written by Regis Bill, Scribe, as dictated by Christella Noa, MD 3:23 PM

## 2017-05-25 NOTE — ED Notes (Signed)
ET tube is 25@the  lip.  Secured with device.

## 2017-05-25 NOTE — H&P (Signed)
History and Physical  Primary Care Provider: Other, Phys, MD    Subjective:     Dylan Haney is a 84 y.o. male with pmh of Alcohol abuse, systolic CHF (EF 11%), Afib, prior MI, who presented to [facility] from free standing ER [facility] after cardiac arrest. Reported to have been with his brother in the car, and he was not feeling well. No reports of chest pain per grandson who is at bedside. Pt is a DNR as confirmed by mPOA and duaghter, but was unfortunately resuscitated given there was no documentation with them. He underwent multiple rounds of CPR, and had downtime of about 15 min with x 4 doses of epinephrine reported before ROSC.   He was then admitted to [facility] for further management.   Review of Systems:    Review of systems not obtained due to patient factors.     Past Medical History:   Diagnosis Date   ??? Alcohol abuse    ??? Arrhythmia     A-Fib   ??? Arthritis     osteoporosis   ??? Cancer (HCC)     skin cancer basal cell   ??? Cardiomyopathy (HCC) 09/11/2012    A.  Echo (09/11/12):  EF 35-40% with mod GHK.  Mildly dil RV.  Mod dil RA/LA.  Mild MR.  Mod-severe TR.  Mod PR.  PASP 60.   ??? Hearing aid worn 08/3015   ??? Heart murmur    ??? Ill-defined condition     seasonal allergies., fatty liver, alcoholism   ??? Ill-defined condition     a-fib   ??? Liver disease     fatty liver   ??? MI (myocardial infarction) (HCC)     states 40 years ago   ??? Reynolds syndrome [facility])       Past Surgical History:   Procedure Laterality Date   ??? HX ORTHOPAEDIC  1960s    index finger laceration repaired   ??? HX PACEMAKER       Prior to Admission medications    Medication Sig Start Date End Date Taking? Authorizing Provider   metoprolol tartrate (LOPRESSOR) 25 mg tablet Take 25 mg by mouth two (2) times a day.   Yes Provider, Historical   raNITIdine (ZANTAC) 150 mg tablet Take 150 mg by mouth nightly.   Yes Provider, Historical   albuterol-ipratropium (DUO-NEB) 2.5 mg-0.5 mg/3 ml nebu 3 mL by Nebulization route every six (6) hours as needed.   Yes Provider,  Historical   albuterol (PROAIR HFA) 90 mcg/actuation inhaler Take 2 Puffs by inhalation every six (6) hours as needed for Wheezing.   Yes Provider, Historical   folic acid 400 mcg tablet Take 1 tablet by mouth daily. 11/05/12   Jennye Boroughs, MD   cholecalciferol (VITAMIN D3) 1,000 unit tablet Take 1 tablet by mouth daily. 11/05/12   Jennye Boroughs, MD   aspirin 81 mg chewable tablet Take 1 Tab by mouth daily. 11/27/11   Jennye Boroughs, MD     No Known Allergies   Family History   Problem Relation Age of Onset   ??? Lung Disease Mother    ??? Cancer Father         SOCIAL HISTORY: Unable to obtain secondary to patient facotrs. Intubated unresponsive. Reported to be heavy drinker in the past.       Objective:       Physical Exam:   Visit Vitals  BP 166/76 (BP 1 Location: Left arm, BP Patient Position:  At rest)   Pulse 99   Temp 97.5 ??F (36.4 ??C)   Resp 23   Ht  (1.702 m)   Wt 60 kg (132 lb 4.4 oz)   SpO2 96%   BMI 20.72 kg/m??     General appearance: Unresponsive, sedated, intubated.   Eyes: pupils slightly reactive, but pinpoint and sluggish  Throat: Dry mucosa  Lungs: Crackles bilaterally, no increased work of breathing, + ETT in place  Heart: Tachycardic, normal rhythm, S1, S2 normal, no murmur, click, rub or gallop  Abdomen: soft, non-tender. Bowel sounds normal. No masses,  no organomegaly  Extremities: edema 1-2+  Pulses: 1+ and symmetric  Skin: Skin color, texture, turgor normal. No rashes or lesions  Neurologic: Unresponsive to painful stimuli, sedated, pupils reactive, no breathing noted over the vent.     ECG:  V paced rhythm      Data Review:   All diagnostic labs and studies have been reviewed.    Chest x-ray  IMPRESSION:   1. Unchanged small bilateral pleural effusions and bibasilar atelectasis.  Unchanged pulmonary edema.    Assessment:     Active Problems:    Cardiac arrest [facility]) (05/22/2017)        Plan:   Cardiac arrest - unclear initial cause ? Pulmonary edema?  - Intubated and sedated   - Consult PCCM   - Add Zosyn and vancomycin   - Blood cultures  - Trend lactate  - Cardiology consulted by the ED on admission, declined ICE    Hypotension - likely secondary to sedation bolus  - Hold sedation for now  - Family DOES NOT want pressors at this time.     EtoH abuse  - CIWA, PRN lorazepam    Systolic CHF (last EF 35% in 2018  - Cardiology consulted  - holding BB in setting of hypotension    Goals of care: discussed with mPOA Dylan Haney Dylan Haney) Dylan Haney (pt's daughter) over the phone and she confirmed that patient was in fact a DNR prior to CPR, she has the paperwork. Unfortunately she is out of state right now visiting her daughter. She will try and get here first thing tomorrow. She would like to focus most on keeping him comfortable, does not want CPR if he were to code overnight, and does not want pressors to keep BP high. Likely will transition to compassionate extubation, but for now keeping as comfortable as possible until she can get here.     FUNCTIONAL STATUS PRIOR TO HOSPITALIZATION (including history of recent falls):Walked independent.     Signed By: Doreen Salvage, MD     May 25, 2017

## 2017-05-25 NOTE — Consults (Signed)
Cardiac Electrophysiology Hospital Consultation Note     Subjective:      Dylan Haney is a 84 y.o. patient who is seen for evaluation of PEA arrest  He has hx of pacemaker and AFIB (permanent)  Dr Dylan Haney at [facility] ER called me today and I had agreed to see him at [facility] ER  Dr Dylan Haney called me to see him in ER  He is on ventilator and not arousable, decerebrate posturing noted  I called his daughter Dylan Haney.  She told me that she is currently living in [facility]  The patient lives in [place] and her brother who is in [place] is mPOA  She knew he had pacemaker placed in [place]  Last year September 2018 he was at [facility] and had syncope, LVEF was 35-40%, moderate MR and severe BAE  Dr Dylan Haney saw him then and recommended follow up with cardiologist in [place] as he and family wanted  He had seen Dr Dylan Haney in 2014 with normal stress test perfusion 03/2014 but LVEF was 35%  He had hx of alcohol but his daughter said he gave up 2 years ago    He was visiting her brother in [place] and was taken to a baseball game because his son needs to pick up his grand daughter from [facility] hospital after discharge  Per ER report the patient vomited and became unconscious in parking lot and was resuscitated in [place] ER  He got 15 min resuscitation for PEA (pacing rhythm but no pulse) and 4 rounds of epinephrine and then had BP and HR  ECG AFIB with artifacts and ventricular fusion  CXR I reviewed showed probably Medtronic pacer with atrial lead dislodged to innominate vein   RV lead at apex    He is not able to give me information-intubated and not responsive    Patient Active Problem List   Diagnosis Code   ??? Falls W19.Lorne Skeens   ??? ETOH abuse F10.10   ??? Atrial fibrillation (HCC) I48.91   ??? Elevated bilirubin R17   ??? Unspecified vitamin D deficiency E55.9   ??? Macrocytosis D75.89   ??? NASH (nonalcoholic steatohepatitis) K75.81   ??? Cardiomyopathy (HCC) I42.9   ??? Health care maintenance Z00.00   ??? Advance directive discussed with patient Z71.89    ??? Healthcare maintenance Z00.00   ??? Syncope and collapse R55   ??? Frequent falls R29.6   ??? Cardiac arrest (HCC) I46.9     Current Facility-Administered Medications   Medication Dose Route Frequency Provider Last Rate Last Dose   ??? sodium chloride (NS) flush 5-40 mL  5-40 mL IntraVENous Q8H Tamsen Meek I, MD       ??? sodium chloride (NS) flush 5-40 mL  5-40 mL IntraVENous PRN Doreen Salvage, MD         Current Outpatient Medications   Medication Sig Dispense Refill   ??? metoprolol tartrate (LOPRESSOR) 25 mg tablet Take 25 mg by mouth two (2) times a day.     ??? raNITIdine (ZANTAC) 150 mg tablet Take 150 mg by mouth nightly.     ??? albuterol-ipratropium (DUO-NEB) 2.5 mg-0.5 mg/3 ml nebu 3 mL by Nebulization route every six (6) hours as needed.     ??? albuterol (PROAIR HFA) 90 mcg/actuation inhaler Take 2 Puffs by inhalation every six (6) hours as needed for Wheezing.     ??? folic acid 400 mcg tablet Take 1 tablet by mouth daily. 30 tablet 11   ??? cholecalciferol (VITAMIN  D3) 1,000 unit tablet Take 1 tablet by mouth daily. 30 tablet 11   ??? aspirin 81 mg chewable tablet Take 1 Tab by mouth daily. 90 Tab 3     No Known Allergies  Past Medical History:   Diagnosis Date   ??? Alcohol abuse    ??? Arrhythmia     A-Fib   ??? Arthritis     osteoporosis   ??? Cancer (HCC)     skin cancer basal cell   ??? Cardiomyopathy (HCC) 09/11/2012    A.  Echo (09/11/12):  EF 35-40% with mod GHK.  Mildly dil RV.  Mod dil RA/LA.  Mild MR.  Mod-severe TR.  Mod PR.  PASP 60.   ??? Hearing aid worn 08/3015   ??? Heart murmur    ??? Ill-defined condition     seasonal allergies., fatty liver, alcoholism   ??? Ill-defined condition     a-fib   ??? Liver disease     fatty liver   ??? MI (myocardial infarction) (HCC)     states 40 years ago   ??? Reynolds syndrome [facility])      Past Surgical History:   Procedure Laterality Date   ??? HX ORTHOPAEDIC  1960s    index finger laceration repaired   ??? HX PACEMAKER       Family History   Problem Relation Age of Onset    ??? Lung Disease Mother    ??? Cancer Father      Social History     Tobacco Use   ??? Smoking status: Former Smoker     Packs/day: 1.00     Years: 50.00     Pack years: 50.00     Last attempt to quit: 07/22/1992     Years since quitting: 24.8   ??? Smokeless tobacco: Never Used   Substance Use Topics   ??? Alcohol use: Yes     Alcohol/week: 0.0 oz     Comment: States he drinks 2 small glasses of Vodka a week        Review of Systems:    He is not able to give me information-intubated and not responsive        Objective:     Visit Vitals  BP 166/76 (BP 1 Location: Left arm, BP Patient Position: At rest)   Pulse 99   Temp 97.5 ??F (36.4 ??C)   Resp 23   Ht  (1.702 m)   Wt 132 lb 4.4 oz (60 kg)   SpO2 96%   BMI 20.72 kg/m??      Physical Exam:   Constitutional: cachectic, intubated  Head: temporal wasting  Eyes: Pupils are equal   ENT: ET tube in  Neck: supple   Cardiovascular: Normal rate, irregular rhythm. No murmur heard.  Pulmonary/Chest: no wheezing  Abdominal: Soft, flat  Musculoskeletal: trace edema.   Neurological: decerebrate posture  Skin: left sided pacer site prominent because he is thin      Assessment/Plan:   PEA arrest, s/p ACLS in ER, back to AFIB with ventricular sensing and fusion complexes  Pacemaker probably Medtronic with atrial lead dislodged on chest xray.  Will call to check for any storage of VT or VF before PEA when daughter or son calls back with information  Hx of cardiomyopathy-non ischemic based on 2016 nuclear stress test  Will continue with ventilator care and get 2 D echo for LVEF, wall motion abnormality  I discussed with his daughter on the phone about prognosis and  goal of care.  She said family is aware and considered DNR but will continue with ventilator for now until reaching decision based on his progress  I discussed with Dr Dylan Haney, ER attending, hospital attending and his ER nurse     Thank you for involving me in this patient's care and please call with  further concerns or questions.      Juliet Rude, M.D.  Electrophysiology/Cardiology  [facility]  [address]                      [address] [place], [place] [address]                             [place] [address]  [phone]                                        [phone]

## 2017-05-25 NOTE — ACP (Advance Care Planning) (Signed)
Advance Care Planning Note    Name: Dylan Haney  Date of birth: Jan 20, 1925  MRN: [id number]  Admission Date: 06-02-2017  1:22 PM    Date of discussion: 05/26/2017    Active Diagnoses:    Hospital Problems  Date Reviewed: 10/28/2016          Codes Class Noted POA    Cardiac arrest [facility]) ICD-10-CM: I46.9  ICD-9-CM: 427.5  06/02/17 Unknown               These active diagnoses are of sufficient risk that focused discussion on advance care planning is indicated in order to allow the patient to thoughtfully consider personal goals of care, and if situations arise that prevent the ability to personally give input, to ensure appropriate representation of their personal desires for different levels and aggressiveness of care.     Discussion:     Persons present and participating in discussion: Dylan Haney, Doreen Salvage, MD, mPOA duaghter (over the phone), and grandson at bedside.     Discussion:   Reviewed current medical condition, s/p cardiac arrest and 15 min of total downtime. Daughter confirmed that patient was DNR prior to arrest. Explained that this does unfortunately happen, given there was no information to contradict full resuscitation at the time of the arrest. Discussed going forward, patient is intubated, and currently with BP that are low (though likely secondary to sedation). Pt is very sick and has a poor prognosis overall given his age, and co morbidities. Dylan Haney was very clear in that she does not want to attempt CPR again if he were to pass away, and also no escalation of care, no pressors. She is working on getting to the bedside by tomorrow. Until then we will focus on keeping him comfortable. Likely would benefit from palliative care consult on Monday if he remains intubated.     Time Spent:     Total time spent face-to-face in education and discussion: 16 minutes.     Doreen Salvage, MD  05/26/2017  9:05 AM

## 2017-05-25 NOTE — Progress Notes (Addendum)
Admission Medication Reconciliation:    Information obtained from: Rx Query, Care Everywhere - Med list from [facility]    Significant PMH/Disease States:   Past Medical History:   Diagnosis Date   ??? Alcohol abuse    ??? Arrhythmia     A-Fib   ??? Arthritis     osteoporosis   ??? Cancer (HCC)     skin cancer basal cell   ??? Cardiomyopathy (HCC) 09/11/2012    A.  Echo (09/11/12):  EF 35-40% with mod GHK.  Mildly dil RV.  Mod dil RA/LA.  Mild MR.  Mod-severe TR.  Mod PR.  PASP 60.   ??? Hearing aid worn 08/3015   ??? Heart murmur    ??? Ill-defined condition     seasonal allergies., fatty liver, alcoholism   ??? Ill-defined condition     a-fib   ??? Liver disease     fatty liver   ??? MI (myocardial infarction) (HCC)     states 40 years ago   ??? Reynolds syndrome [facility])        Chief Complaint for this Admission:    Chief Complaint   Patient presents with   ??? Cardiac arrest       Allergies:  Patient has no known allergies.    Prior to Admission Medications:   Prior to Admission Medications   Prescriptions Last Dose Informant Patient Reported? Taking?   albuterol (PROAIR HFA) 90 mcg/actuation inhaler   Yes Yes   Sig: Take 2 Puffs by inhalation every six (6) hours as needed for Wheezing.   albuterol-ipratropium (DUO-NEB) 2.5 mg-0.5 mg/3 ml nebu   Yes Yes   Sig: 3 mL by Nebulization route every six (6) hours as needed.   aspirin 81 mg chewable tablet  Self No Yes   Sig: Take 1 Tab by mouth daily.   cholecalciferol (VITAMIN D3) 1,000 unit tablet  Self No Yes   Sig: Take 1 tablet by mouth daily.   folic acid 400 mcg tablet  Self No Yes   Sig: Take 1 tablet by mouth daily.   metoprolol tartrate (LOPRESSOR) 25 mg tablet   Yes Yes   Sig: Take 25 mg by mouth two (2) times a day.   raNITIdine (ZANTAC) 150 mg tablet   Yes Yes   Sig: Take 150 mg by mouth nightly.      Facility-Administered Medications: None         Comments/Recommendations: Patient is currently intubated and family members are not present yet.   1) Added - ranitidine, metoprolol, Duo-nebs, and Proair - these were listed in Rx Query or records from [facility]    Will attempt to confirm meds if family members arrive later.    Addendum - was able to confirm medications over phone with patient's daughter Ellison Carwin, PharmD [phone]

## 2017-05-25 NOTE — ED Notes (Signed)
Pt arrived unresponsive in personal car around 1040. Pt gray, pulseless. Pt transported to treatment room, CPR begun, Dr. Ladona Ridgel at bedside. Regained pulse at 1103.

## 2017-05-25 NOTE — ED Notes (Signed)
TRANSFER - OUT REPORT:    Verbal report given to [facility] RN(name) on Dylan Haney  being transferred to [facility] ED(unit) for routine progression of care       Report consisted of patient???s Situation, Background, Assessment and   Recommendations(SBAR).     Information from the following report(s) ED Summary, MAR and Recent Results was reviewed with the receiving nurse.    Lines:   Peripheral IV 05/03/2017 Left Antecubital (Active)   Site Assessment Clean, dry, & intact 05/13/2017 12:34 PM   Phlebitis Assessment 0 05/20/2017 12:34 PM   Infiltration Assessment 0 05/07/2017 12:34 PM   Dressing Type Transparent 05/23/2017 12:34 PM   Hub Color/Line Status Flushed 05/06/2017 12:34 PM        Opportunity for questions and clarification was provided.      Patient transported with:   Monitor  O2 @ intubated at 100 % liters, paced

## 2017-05-25 NOTE — ED Provider Notes (Signed)
HPI The patient arrived in cardiac arrest. According to his son, they were in the car and the patient became nauseated, had an episode of vomiting, and then became short of breath, at which point the son came to our emergency department, and he says the patient became unresponsive when he pulled into our parking lot. The patient was brought immediately to a treatment room was noted to be pulseless with no respiratory effort and with fixed dilated pupils. CPR was started. An IV was started and the patient was given 4 rounds of epinephrine. The patient after 15 minutes developed a pulse and blood pressure. The patient was also intubated orally. I have spoken to the patient's daughter and she is in agreement with the treatment plan so far. She apparently is his power of attorney. I have spoken with Dr.ngo and Dr. Gregor Hams at [facility] and they accept the patient in transfer.      Past Medical History:   Diagnosis Date   ??? Alcohol abuse    ??? Arrhythmia     A-Fib   ??? Arthritis     osteoporosis   ??? Cancer (HCC)     skin cancer basal cell   ??? Cardiomyopathy (HCC) 09/11/2012    A.  Echo (09/11/12):  EF 35-40% with mod GHK.  Mildly dil RV.  Mod dil RA/LA.  Mild MR.  Mod-severe TR.  Mod PR.  PASP 60.   ??? Hearing aid worn 08/3015   ??? Heart murmur    ??? Ill-defined condition     seasonal allergies., fatty liver, alcoholism   ??? Ill-defined condition     a-fib   ??? Liver disease     fatty liver   ??? MI (myocardial infarction) (HCC)     states 40 years ago   ??? Reynolds syndrome [facility])        Past Surgical History:   Procedure Laterality Date   ??? HX ORTHOPAEDIC  1960s    index finger laceration repaired         Family History:   Problem Relation Age of Onset   ??? Lung Disease Mother    ??? Cancer Father        Social History     Socioeconomic History   ??? Marital status: WIDOWED     Spouse name: Not on file   ??? Number of children: Not on file   ??? Years of education: Not on file   ??? Highest education level: Not on file    Occupational History   ??? Not on file   Social Needs   ??? Financial resource strain: Not on file   ??? Food insecurity:     Worry: Not on file     Inability: Not on file   ??? Transportation needs:     Medical: Not on file     Non-medical: Not on file   Tobacco Use   ??? Smoking status: Former Smoker     Packs/day: 1.00     Years: 50.00     Pack years: 50.00     Last attempt to quit: 07/22/1992     Years since quitting: 24.8   ??? Smokeless tobacco: Never Used   Substance and Sexual Activity   ??? Alcohol use: Yes     Alcohol/week: 0.0 oz     Comment: States he drinks 2 small glasses of Vodka a week   ??? Drug use: No   ??? Sexual activity: Not on file   Lifestyle   ???  Physical activity:     Days per week: Not on file     Minutes per session: Not on file   ??? Stress: Not on file   Relationships   ??? Social connections:     Talks on phone: Not on file     Gets together: Not on file     Attends religious service: Not on file     Active member of club or organization: Not on file     Attends meetings of clubs or organizations: Not on file     Relationship status: Not on file   ??? Intimate partner violence:     Fear of current or ex partner: Not on file     Emotionally abused: Not on file     Physically abused: Not on file     Forced sexual activity: Not on file   Other Topics Concern   ??? Not on file   Social History Narrative   ??? Not on file         ALLERGIES: Patient has no known allergies.    Review of Systems   Unable to perform ROS: Patient unresponsive       Vitals:    06-22-17 1140 06/22/17 1145 2017-06-22 1150 06-22-17 1155   BP: (!) 80/66 112/64 (!) 89/67 108/65   Pulse: 84 87 84 83   Resp: SpO2: (!) 80% (!) 79% (!) 76% (!) 77%            Physical Exam   Constitutional:   unresponsive   HENT:   Head: Atraumatic.   Eyes:   Pupils fixed and dilated.   Neck: Normal range of motion.   Pulmonary/Chest:   After intubation b.s. nl   Abdominal: Soft.   Neurological:   unresponsive        MDM   Number of Diagnoses or Management Options     Amount and/or Complexity of Data Reviewed  Clinical lab tests: ordered and reviewed  Discuss the patient with other providers: yes  Independent visualization of images, tracings, or specimens: yes    Risk of Complications, Morbidity, and/or Mortality  Presenting problems: high  Diagnostic procedures: high  Management options: high    Critical Care  Total time providing critical care: 75-105 minutes         Intubation  Date/Time: 2017-06-22 12:29 PM  Performed by: Cindi Carbon, MD  Authorized by: Cindi Carbon, MD     Consent:     Consent obtained:  Emergent situation  Pre-procedure details:     Patient status:  Unresponsive  Procedure details:     Laryngoscope blade:  Miller 3    Tube size (mm):  7.5    Number of attempts:  1    Cricoid pressure: no      Tube visualized through cords: yes    Placement assessment:     Tube secured with:  ETT holder    Placement verification: chest rise and CXR verification    Post-procedure details:     Patient tolerance of procedure:  Tolerated well, no immediate complications     ED EKG interpretation:  Rhythm:a fib. New t inversions in lat. leadsThis EKG was interpreted by Cindi Carbon, MD,ED Provider.

## 2017-05-26 ENCOUNTER — Inpatient Hospital Stay: Admit: 2017-05-26 | Payer: MEDICARE | Primary: Family Medicine

## 2017-05-26 LAB — CBC WITH AUTOMATED DIFF
ABS. BASOPHILS: 0 10*3/uL (ref 0.0–0.1)
ABS. EOSINOPHILS: 0 10*3/uL (ref 0.0–0.4)
ABS. IMM. GRANS.: 0.1 10*3/uL — ABNORMAL HIGH (ref 0.00–0.04)
ABS. LYMPHOCYTES: 0.9 10*3/uL (ref 0.8–3.5)
ABS. MONOCYTES: 0.7 10*3/uL (ref 0.0–1.0)
ABS. NEUTROPHILS: 12.4 10*3/uL — ABNORMAL HIGH (ref 1.8–8.0)
ABSOLUTE NRBC: 0 10*3/uL (ref 0.00–0.01)
BASOPHILS: 0 % (ref 0–1)
EOSINOPHILS: 0 % (ref 0–7)
HCT: 38.5 % (ref 36.6–50.3)
HGB: 12.2 g/dL (ref 12.1–17.0)
IMMATURE GRANULOCYTES: 1 % — ABNORMAL HIGH (ref 0.0–0.5)
LYMPHOCYTES: 7 % — ABNORMAL LOW (ref 12–49)
MCH: 32.6 PG (ref 26.0–34.0)
MCHC: 31.7 g/dL (ref 30.0–36.5)
MCV: 102.9 FL — ABNORMAL HIGH (ref 80.0–99.0)
MONOCYTES: 5 % (ref 5–13)
MPV: 9.4 FL (ref 8.9–12.9)
NEUTROPHILS: 87 % — ABNORMAL HIGH (ref 32–75)
NRBC: 0 PER 100 WBC
PLATELET: 152 10*3/uL (ref 150–400)
RBC: 3.74 M/uL — ABNORMAL LOW (ref 4.10–5.70)
RDW: 16.7 % — ABNORMAL HIGH (ref 11.5–14.5)
WBC: 14.2 10*3/uL — ABNORMAL HIGH (ref 4.1–11.1)

## 2017-05-26 LAB — EKG, 12 LEAD, INITIAL
Atrial Rate: 89 {beats}/min
Calculated R Axis: -74 degrees
Calculated T Axis: 110 degrees
Q-T Interval: 480 ms
QRS Duration: 198 ms
QTC Calculation (Bezet): 571 ms
Ventricular Rate: 85 {beats}/min

## 2017-05-26 LAB — METABOLIC PANEL, BASIC
Anion gap: 11 mmol/L (ref 5–15)
BUN/Creatinine ratio: 24 — ABNORMAL HIGH (ref 12–20)
BUN: 44 MG/DL — ABNORMAL HIGH (ref 6–20)
CO2: 17 mmol/L — ABNORMAL LOW (ref 21–32)
Calcium: 7.9 MG/DL — ABNORMAL LOW (ref 8.5–10.1)
Chloride: 112 mmol/L — ABNORMAL HIGH (ref 97–108)
Creatinine: 1.84 MG/DL — ABNORMAL HIGH (ref 0.70–1.30)
GFR est AA: 42 mL/min/{1.73_m2} — ABNORMAL LOW (ref >60)
GFR est non-AA: 35 mL/min/{1.73_m2} — ABNORMAL LOW (ref >60)
Glucose: 87 mg/dL (ref 65–100)
Potassium: 4.5 mmol/L (ref 3.5–5.1)
Sodium: 140 mmol/L (ref 136–145)

## 2017-05-26 LAB — PHOSPHORUS: Phosphorus: 3.5 MG/DL (ref 2.6–4.7)

## 2017-05-26 LAB — MAGNESIUM: Magnesium: 1.7 mg/dL (ref 1.6–2.4)

## 2017-05-26 MED ORDER — MIDAZOLAM 1 MG/ML IJ SOLN
1 mg/mL | INTRAMUSCULAR | Status: DC | PRN
Start: 2017-05-26 — End: 2017-05-27

## 2017-05-26 MED ORDER — MORPHINE 10 MG/ML INJ SOLUTION
10 mg/ml | INTRAMUSCULAR | Status: DC | PRN
Start: 2017-05-26 — End: 2017-05-27

## 2017-05-26 MED ORDER — DOBUTAMINE IN D5W 2,000 MCG/ML IV
500 mg/250 mL (2,000 mcg/mL) | INTRAVENOUS | Status: DC
Start: 2017-05-26 — End: 2017-05-26
  Administered 2017-05-26: 13:00:00 via INTRAVENOUS

## 2017-05-26 MED ORDER — CHLORHEXIDINE GLUCONATE 0.12 % MOUTHWASH
0.12 % | Freq: Two times a day (BID) | Status: DC
Start: 2017-05-26 — End: 2017-05-26
  Administered 2017-05-26 (×2): via ORAL

## 2017-05-26 MED ORDER — LORAZEPAM 2 MG/ML ORAL CONCENTRATE
2 mg/mL | ORAL | Status: DC | PRN
Start: 2017-05-26 — End: 2017-05-27

## 2017-05-26 MED ORDER — GLYCOPYRROLATE 0.2 MG/ML IJ SOLN
0.2 mg/mL | INTRAMUSCULAR | Status: DC | PRN
Start: 2017-05-26 — End: 2017-05-27
  Administered 2017-05-26 – 2017-05-27 (×6): via INTRAVENOUS

## 2017-05-26 MED ORDER — ONDANSETRON 4 MG TAB, RAPID DISSOLVE
4 mg | Freq: Four times a day (QID) | ORAL | Status: DC | PRN
Start: 2017-05-26 — End: 2017-05-27

## 2017-05-26 MED ORDER — PHENYLEPHRINE 30 MG/250 ML INFUSION
30 mg/ 250 ml | INTRAVENOUS | Status: DC
Start: 2017-05-26 — End: 2017-05-26
  Administered 2017-05-26: 13:00:00 via INTRAVENOUS

## 2017-05-26 MED ORDER — MORPHINE 2 MG/ML INJECTION
2 mg/mL | INTRAMUSCULAR | Status: DC | PRN
Start: 2017-05-26 — End: 2017-05-27
  Administered 2017-05-26 – 2017-05-27 (×9): via INTRAVENOUS

## 2017-05-26 MED FILL — GLYCOPYRROLATE 0.2 MG/ML IJ SOLN: 0.2 mg/mL | INTRAMUSCULAR | Qty: 1

## 2017-05-26 MED FILL — PIPERACILLIN-TAZOBACTAM 3.375 GRAM IV SOLR: 3.375 gram | INTRAVENOUS | Qty: 3.38

## 2017-05-26 MED FILL — LORAZEPAM 2 MG/ML IJ SOLN: 2 mg/mL | INTRAMUSCULAR | Qty: 1

## 2017-05-26 MED FILL — NORMAL SALINE FLUSH 0.9 % INJECTION SYRINGE: INTRAMUSCULAR | Qty: 10

## 2017-05-26 MED FILL — MORPHINE 2 MG/ML INJECTION: 2 mg/mL | INTRAMUSCULAR | Qty: 1

## 2017-05-26 MED FILL — IPRATROPIUM-ALBUTEROL 2.5 MG-0.5 MG/3 ML NEB SOLUTION: 2.5 mg-0.5 mg/3 ml | RESPIRATORY_TRACT | Qty: 3

## 2017-05-26 MED FILL — PHENYLEPHRINE 30 MG/250 ML INFUSION: 30 mg/ 250 ml | INTRAVENOUS | Qty: 250

## 2017-05-26 MED FILL — LORAZEPAM 2 MG/ML ORAL CONCENTRATE: 2 mg/mL | ORAL | Qty: 0.5

## 2017-05-26 MED FILL — DIPRIVAN 10 MG/ML INTRAVENOUS EMULSION: 10 mg/mL | INTRAVENOUS | Qty: 100

## 2017-05-26 MED FILL — MIDAZOLAM (PF) 5 MG/ML INJECTION SOLUTION: 5 mg/mL | INTRAMUSCULAR | Qty: 1

## 2017-05-26 MED FILL — CHLORHEXIDINE GLUCONATE 0.12 % MOUTHWASH: 0.12 % | Qty: 473

## 2017-05-26 MED FILL — VANCOMYCIN 750 MG IV SOLUTION: 750 mg | INTRAVENOUS | Qty: 750

## 2017-05-26 NOTE — Progress Notes (Addendum)
0730 Bedside and Verbal shift change report given to Faith, RN (Cabin crew) by Waynetta Sandy, RN (offgoing nurse). Report included the following information SBAR, Kardex, Procedure Summary, Intake/Output, MAR, Recent Results and Cardiac Rhythm paced.   0800 Turned patient - pt desat to 80's -suctioned inline & bagged patient. While bagging pt opened eyes but was unable to track or follow commands.  RT at bedside - pts sats returned to 100   0810 Dr. Shon Hale at bedside - updated on pts status - will notify when family is at bedside to discuss pts plan of care  Orders received to hold lasix for now due to low BP & pt being a partial code (no escalation of care)  0930 Family at bedside - Dr. Shon Hale notified family ready to discuss plan of care when able  1040 Dr. Shon Hale at bedside to talk to family about plan of care  1100 Family has decided to withdrawal care - [facility] notified & requested call back with time of death, but due to age will not be a candidate for donation  1130 Pt treated with comfort meds - RT at bedside, pt extubated - family at bedside   Pastoral care notified  1545 TRANSFER - OUT REPORT:    Verbal report given to 6E RN(name) on Dylan Haney  being transferred to 6E(unit) for routine progression of care       Report consisted of patient???s Situation, Background, Assessment and   Recommendations(SBAR).     Information from the following report(s) SBAR, Kardex, Intake/Output, MAR, Recent Results and Cardiac Rhythm paced was reviewed with the receiving nurse.    Lines:   Peripheral IV 06-23-17 Left Antecubital (Active)   Site Assessment Clean, dry, & intact 05/26/2017  8:00 AM   Phlebitis Assessment 0 05/26/2017  8:00 AM   Infiltration Assessment 0 05/26/2017  8:00 AM   Dressing Status Clean, dry, & intact 05/26/2017  8:00 AM   Dressing Type Transparent 05/26/2017  8:00 AM   Hub Color/Line Status Pink;Flushed 05/26/2017  8:00 AM   Action Taken Open ports on tubing capped 05/26/2017  8:00 AM    Alcohol Cap Used Yes 05/26/2017  8:00 AM       Peripheral IV 06/23/2017 Left Wrist (Active)   Site Assessment Clean, dry, & intact 05/26/2017  8:00 AM   Phlebitis Assessment 0 05/26/2017  8:00 AM   Infiltration Assessment 0 05/26/2017  8:00 AM   Dressing Status Clean, dry, & intact 05/26/2017  8:00 AM   Dressing Type Transparent 05/26/2017  8:00 AM   Hub Color/Line Status Pink;Flushed 05/26/2017  8:00 AM   Action Taken Open ports on tubing capped 05/26/2017  8:00 AM   Alcohol Cap Used Yes 05/26/2017  8:00 AM        Opportunity for questions and clarification was provided.      Patient transported with:   O2 @ 2 liters  Patient-specific medications from Pharmacy  Registered Nurse  Tech

## 2017-05-26 NOTE — Progress Notes (Signed)
Spiritual Care Assessment/Progress Note  [facility]      NAME: Dylan Haney      MRN: [id number]  AGE: 84 y.o. SEX: male  Religious Affiliation: Non denominational   Language: English     05/26/2017           Spiritual Assessment begun in [facility] CORONARY CARE through conversation with:         Patient         Family     Friend(s)        Reason for Consult: End-of-life support     Spiritual beliefs: (Please include comment if needed)      Identifies with a faith tradition:          Supported by a faith community:             Claims no spiritual orientation:            Seeking spiritual identity:                 Adheres to an individual form of spirituality:            Not able to assess:                           Identified resources for coping:       Prayer                                Music                   Guided Imagery      Family/friends                  Pet visits      Devotional reading                          Unknown      Other:                                              Interventions offered during this visit: (See comments for more details)    Patient Interventions: Initial/Spiritual assessment, Critical care     Family/Friend(s): Affirmation of emotions/emotional suffering, Catharsis/review of pertinent events in supportive environment, Initial Assessment, Prayer (assurance of)     Plan of Care:      Support spiritual and/or cultural needs     Support AMD and/or advance care planning process       Support grieving process    Coordinate Rites and/or Rituals     Coordination with community clergy    No spiritual needs identified at this time    Detailed Plan of Care below (See Comments)   Make referral to Music Therapy   Make referral to Pet Therapy      Make referral to Addiction services   Make referral to [facility] Passages   Make referral to Spiritual Care Partner   No future visits requested          Follow up visits as needed     Comments: Notified by nurse of Dylan Haney in CCU-23 that patient was being transitioned to comfort care and that family members were at his  bedside. Multiple family members were with patient when chaplain arrived; patient was resting quietly with eyes closed. Family denied any needs/concerns and said they were coping alright. Assured them of prayers on their behalf and of ongoing chaplain availability for support. Chaplain: Rev. Cyndia Diver. Cobb, MDiv; BCC, to contact Spiritual Care Services call: [phone]

## 2017-05-26 NOTE — Progress Notes (Signed)
Cardiac Electrophysiology Progress Note     Subjective:      Dylan Haney is a 84 y.o. patient who is seen for evaluation of PEA arrest  He has hx of pacemaker and AFIB (permanent) placed in [place]  Last year September 2018 he was at [facility] and had syncope, LVEF was 35-40%, moderate MR and severe BAE  Dr Irving Burton saw him then and recommended follow up with cardiologist in [place] as he and family wanted  He had seen Dr Elmon Kirschner in 2014 with normal stress test perfusion 03/2014 but LVEF was 35%  He had hx of alcohol but his daughter said he gave up 2 years ago  He was visiting his son in [place] and was taken to a baseball game because his son needs to pick up his grand daughter from [facility] hospital after discharge  Per ER report the patient vomited and became unconscious in parking lot and was resuscitated in [place] ER  He got 15 min resuscitation for PEA (pacing rhythm but no pulse) and 4 rounds of epinephrine and then had BP and HR  ECG AFIB with artifacts and ventricular fusion  CXR I reviewed showed probably Medtronic pacer with atrial lead dislodged to innominate vein   RV lead at apex    B[employer]ron[employer] checked his pacemaker and it is VVI pacing 95%, with no VT or VF above rate 180 bpm    He is not able to give me information-intubated and not responsive    Patient Active Problem List   Diagnosis Code   ??? Falls W19.Lorne Skeens   ??? ETOH abuse F10.10   ??? Atrial fibrillation (HCC) I48.91   ??? Elevated bilirubin R17   ??? Unspecified vitamin D deficiency E55.9   ??? Macrocytosis D75.89   ??? NASH (nonalcoholic steatohepatitis) K75.81   ??? Cardiomyopathy (HCC) I42.9   ??? Health care maintenance Z00.00   ??? Advance directive discussed with patient Z71.89   ??? Healthcare maintenance Z00.00   ??? Syncope and collapse R55   ??? Frequent falls R29.6   ??? Cardiac arrest (HCC) I46.9     Current Facility-Administered Medications   Medication Dose Route Frequency Provider Last Rate Last Dose    ??? sodium chloride (NS) flush 5-40 mL  5-40 mL IntraVENous Q8H Tamsen Meek I, MD   10 mL at 05/26/17 1610   ??? sodium chloride (NS) flush 5-40 mL  5-40 mL IntraVENous PRN Doreen Salvage, MD       ??? furosemide (LASIX) injection 40 mg  40 mg IntraVENous BID Thurston Pounds, MD       ??? piperacillin-tazobactam (ZOSYN) 3.375 g in 0.9% sodium chloride (MBP/ADV) 100 mL  3.375 g IntraVENous Q8H Tamsen Meek I, MD 25 mL/hr at 05/26/17 0609 3.375 g at 05/26/17 0609   ??? LORazepam (ATIVAN) injection 2 mg  2 mg IntraVENous Q1H PRN Tamsen Meek I, MD   2 mg at 05/26/2017 2329   ??? albuterol-ipratropium (DUO-NEB) 2.5 MG-0.5 MG/3 ML  3 mL Nebulization Q4H RT Tamsen Meek I, MD   3 mL at 05/26/17 0509   ??? aspirin chewable tablet 81 mg  81 mg Oral DAILY Tamsen Meek I, MD       ??? pantoprazole (PROTONIX) 40 mg in sodium chloride 0.9% 10 mL injection  40 mg IntraVENous DAILY Ellard Artis, Jodelle Gross, MD       ??? Vancomycin - Pharmacy Dosing   Other Rx Dosing/Monitoring Doreen Salvage, MD       ???  vancomycin (VANCOCIN) 750 mg in 0.9% sodium chloride (MBP/ADV) 250 mL  750 mg IntraVENous Q24H Tamsen Meek I, MD       ??? propofol (DIPRIVAN) infusion  0-50 mcg/kg/min IntraVENous TITRATE Tamsen Meek I, MD 3.6 mL/hr at 05/26/17 0416 10 mcg/kg/min at 05/26/17 0416   ??? chlorhexidine (PERIDEX) 0.12 % mouthwash 15 mL  15 mL Oral BID Tamsen Meek I, MD   15 mL at 05/24/2017 2253     No Known Allergies  Past Medical History:   Diagnosis Date   ??? Alcohol abuse    ??? Arrhythmia     A-Fib   ??? Arthritis     osteoporosis   ??? Cancer (HCC)     skin cancer basal cell   ??? Cardiomyopathy (HCC) 09/11/2012    A.  Echo (09/11/12):  EF 35-40% with mod GHK.  Mildly dil RV.  Mod dil RA/LA.  Mild MR.  Mod-severe TR.  Mod PR.  PASP 60.   ??? Hearing aid worn 08/3015   ??? Heart murmur    ??? Ill-defined condition     seasonal allergies., fatty liver, alcoholism    ??? Ill-defined condition     a-fib   ??? Liver disease     fatty liver   ??? MI (myocardial infarction) (HCC)     states 40 years ago   ??? Reynolds syndrome [facility])      Past Surgical History:   Procedure Laterality Date   ??? HX ORTHOPAEDIC  1960s    index finger laceration repaired   ??? HX PACEMAKER       Family History   Problem Relation Age of Onset   ??? Lung Disease Mother    ??? Cancer Father      Social History     Tobacco Use   ??? Smoking status: Former Smoker     Packs/day: 1.00     Years: 50.00     Pack years: 50.00     Last attempt to quit: 07/22/1992     Years since quitting: 24.8   ??? Smokeless tobacco: Never Used   Substance Use Topics   ??? Alcohol use: Yes     Alcohol/week: 0.0 oz     Comment: States he drinks 2 small glasses of Vodka a week        Review of Systems:    He is not able to give me information-intubated and not responsive        Objective:     Visit Vitals  BP (!) 75/47   Pulse 73   Temp 99.1 ??F (37.3 ??C)   Resp 16   Ht  (1.702 m)   Wt 142 lb 13.7 oz (64.8 kg)   SpO2 100%   BMI 22.37 kg/m??      Physical Exam:   Constitutional: cachectic, intubated  Head: temporal wasting  Eyes: Pupils are equal   ENT: ET tube in  Neck: supple   Cardiovascular: Normal rate, irregular rhythm. No murmur heard.  Pulmonary/Chest: no wheezing + crackles  Abdominal: Soft, flat  Musculoskeletal: trace edema.   Neurological: decerebrate posture  Skin: left sided pacer site prominent because he is thin      Assessment/Plan:   PEA arrest, s/p ACLS in ER, back to AFIB with ventricular pacing this am  Pacemaker is Biotronik and it has been checked  It functions properly without noted VT or VF  Hx of cardiomyopathy-non ischemic based on 2016 nuclear stress test  Will continue  with ventilator care and get 2 D echo for LVEF, wall motion abnormality  I discussed with his daughter on the phone about prognosis and goal of care.  She said family is aware and considered DNR but will continue with  ventilator for now until reaching decision based on his progress  I discussed with his CCU nurse   Start neosynephrine for low bp and continue with diuresis  Add dobutamine 3 mcg/kg/min for acute on chronic systolic CHF    Thank you for involving me in this patient's care and please call with further concerns or questions.      Juliet Rude, M.D.  Electrophysiology/Cardiology  [facility]  [address]                      [address] [place], [place] [address]                             [place] [address]  [phone]                                        [phone]

## 2017-05-26 NOTE — Progress Notes (Signed)
Hospitalist Progress Note  Purvis Sheffield, MD  Answering service: [phone] OR [phone] from in house phone      Date of Service:  05/26/2017  NAME:  Dylan Haney  DOB:  20-May-1924  MRN:  [id number]    Admission Summary:   44M hx of Afib and PPM brought in after arrest in community. Intubated, admitted to CCU. Likely PEA  Interval history / Subjective:   Patient seen and examined at bedside, intubated, ventilated, restless when sedation off. No purposful movements     Assessment & Plan:     Cardiac arrest - PEA unclear initial cause   - Intubated and sedated- Consult PCCM   - CT head no acute findings, CXR pulm edema/some effusions.  - Add Zosyn and vancomycin - Blood cultures- Trend lactate  - Cardiology following, declined ICE  - family don't want escalation of care- no pressors/CPR  ??  Hypotension -  - Hold sedation for now  - Family DOES NOT want pressors at this time.     Afib: s/p PPM- monitor  EtoH abuse: - CIWA, PRN lorazepam  Systolic CHF (last EF 35% in 2018  - holding BB in setting of hypotension  - TTE pending    Code status: partila  DVT prophylaxis: heparin  Care Plan discussed with: Patient/Family and Nurse  Disposition: TBD     Hospital Problems  Date Reviewed: 04-Nov-2016          Codes Class Noted POA    Cardiac arrest [facility]) ICD-10-CM: I46.9  ICD-9-CM: 427.5  2017/06/09 Unknown            Review of Systems:   Pertinent items are mentioned in interval history.    Vital Signs:    Last 24hrs VS reviewed since prior progress note. Most recent are:  Visit Vitals  BP (!) 75/47   Pulse 73   Temp 99.1 ??F (37.3 ??C)   Resp 16   Ht  (1.702 m)   Wt 64.8 kg (142 lb 13.7 oz)   SpO2 100%   BMI 22.37 kg/m??         Intake/Output Summary (Last 24 hours) at 05/26/2017 0820  Last data filed at 05/26/2017 0700  Gross per 24 hour   Intake 858.8 ml   Output 255 ml   Net 603.8 ml        Physical Examination:   General:  intubated sedated   Card:  S1, S2 without murmurs, warm peripheries  Resp:  course with some creps. No wheezes, secretions ++  Abd:  Soft, mild-distended,  Extremities:   no significant edema  Neuro:  Pupils sluggishly reactive, restless, not purposeful    Data Review:    Review and/or order of clinical lab test  Review and/or order of tests in the radiology section of CPT  Review and/or order of tests in the medicine section of CPT  Labs:     Recent Labs     05/26/17  0353 06/09/17  1416   WBC 14.2* 9.5   HGB 12.2 13.1   HCT 38.5 42.6   PLT 152 205     Recent Labs     05/26/17  0353 06-09-2017  1416   NA 140 141   K 4.5 5.3*   CL 112* 109*   CO2 17* 22   BUN 44* 37*   CREA 1.84* 1.58*   GLU 87 124*   CA 7.9* 8.7   MG 1.7  --    PHOS 3.5  --  Recent Labs     12-Jun-2017  1416   SGOT 39*   ALT 18   AP 128*   TBILI 1.1*   TP 7.3   ALB 3.3*   GLOB 4.0     Recent Labs     06-12-17  1416   INR 1.3*   PTP 13.3*   APTT 30.5      No results for input(s): FE, TIBC, PSAT, FERR in the last 72 hours.   Lab Results   Component Value Date/Time    Folate 23.7 (H) 09/10/2012 09:55 PM      No results for input(s): PH, PCO2, PO2 in the last 72 hours.  Recent Labs     06-12-17  1416   TROIQ 0.22*     Lab Results   Component Value Date/Time    Cholesterol, total 106 10/21/2016 03:22 AM    HDL Cholesterol 46 10/21/2016 03:22 AM    LDL,Direct 65 09/28/2015 04:54 PM    LDL, calculated 49.6 10/21/2016 03:22 AM    Triglyceride 52 10/21/2016 03:22 AM    CHOL/HDL Ratio 2.3 10/21/2016 03:22 AM     Lab Results   Component Value Date/Time    Glucose (POC) 156 (H) 2017/06/12 11:40 AM    Glucose (POC) 100 09/12/2012 11:49 PM    Glucose (POC) 95 09/12/2012 09:05 PM    Glucose (POC) 93 09/12/2012 06:22 PM    Glucose (POC) 109 09/12/2012 12:17 PM    Glucose (POC) 74 (L) 09/12/2012 12:03 PM     Lab Results   Component Value Date/Time    Color YELLOW/STRAW 2017-06-12 12:15 PM    Appearance CLEAR 06-12-2017 12:15 PM    Specific gravity 1.020 2017/06/12 12:15 PM     Specific gravity 1.018 10/20/2016 09:19 PM    pH (UA) 5.5 2017-06-12 12:15 PM    Protein TRACE (A) 2017/06/12 12:15 PM    Glucose NEGATIVE  12-Jun-2017 12:15 PM    Ketone NEGATIVE  2017/06/12 12:15 PM    Bilirubin NEGATIVE  06-12-17 12:15 PM    Urobilinogen 1.0 06-12-2017 12:15 PM    Nitrites NEGATIVE  12-Jun-2017 12:15 PM    Leukocyte Esterase NEGATIVE  06-12-17 12:15 PM    Epithelial cells FEW 2017/06/12 12:15 PM    Bacteria NEGATIVE  2017/06/12 12:15 PM    WBC 0-4 Jun 12, 2017 12:15 PM    RBC 0-5 2017-06-12 12:15 PM     Medications Reviewed:     Current Facility-Administered Medications   Medication Dose Route Frequency   ??? PHENYLephrine (NEO-SYNEPHRINE) 30 mg in 0.9% sodium chloride 250 mL infusion  10-100 mcg/min IntraVENous TITRATE   ??? DOBUTamine (DOBUTREX) 500 mg/250 mL (2,000 mcg/mL) infusion  2.5 mcg/kg/min IntraVENous CONTINUOUS   ??? sodium chloride (NS) flush 5-40 mL  5-40 mL IntraVENous Q8H   ??? sodium chloride (NS) flush 5-40 mL  5-40 mL IntraVENous PRN   ??? furosemide (LASIX) injection 40 mg  40 mg IntraVENous BID   ??? piperacillin-tazobactam (ZOSYN) 3.375 g in 0.9% sodium chloride (MBP/ADV) 100 mL  3.375 g IntraVENous Q8H   ??? LORazepam (ATIVAN) injection 2 mg  2 mg IntraVENous Q1H PRN   ??? albuterol-ipratropium (DUO-NEB) 2.5 MG-0.5 MG/3 ML  3 mL Nebulization Q4H RT   ??? aspirin chewable tablet 81 mg  81 mg Oral DAILY   ??? pantoprazole (PROTONIX) 40 mg in sodium chloride 0.9% 10 mL injection  40 mg IntraVENous DAILY   ??? Vancomycin - Pharmacy Dosing   Other Rx Dosing/Monitoring   ???  vancomycin (VANCOCIN) 750 mg in 0.9% sodium chloride (MBP/ADV) 250 mL  750 mg IntraVENous Q24H   ??? propofol (DIPRIVAN) infusion  0-50 mcg/kg/min IntraVENous TITRATE   ??? chlorhexidine (PERIDEX) 0.12 % mouthwash 15 mL  15 mL Oral BID   ______________________________________________________________________  EXPECTED LENGTH OF STAY: - - -  ACTUAL LENGTH OF STAY:          1               Kaiden Pech, MD

## 2017-05-26 NOTE — ACP (Advance Care Planning) (Signed)
Advance Care Planning Note    Name: Dylan Haney  Date of birth: [birth year]  MRN: [id number]  Admission Date: Jun 22, 2017  1:22 PM    Date of discussion: 05/26/2017    Active Diagnoses:    Hospital Problems  Date Reviewed: 17-Nov-2016          Codes Class Noted POA    Cardiac arrest [facility]) ICD-10-CM: I46.9  ICD-9-CM: 427.5  06-22-17 Unknown               These active diagnoses are of sufficient risk that focused discussion on advance care planning is indicated in order to allow the patient to thoughtfully consider personal goals of care, and if situations arise that prevent the ability to personally give input, to ensure appropriate representation of their personal desires for different levels and aggressiveness of care.     Discussion:     Persons present and participating in discussion: Venetia Maxon, MD, Daughter, Daughter in [place], GrandChildren    Discussion: discussed current condition, short and long term prognosis,  Family decided on palliative extubation.    Time Spent:     Total time spent face-to-face in education and discussion: 19 minutes.     Purvis Sheffield, MD  05/26/2017  10:59 AM

## 2017-05-26 NOTE — Other (Signed)
IDR/SLIDR Summary          Patient: Dylan Haney MRN: [id number]    Age: 84 y.o.     Birthdate: 10-07-1924 Room/Bed: 4223/01   Admit Diagnosis: Cardiac arrest (HCC) [I46.9]  Principal Diagnosis: <principal problem not specified>   Goals: comfort, palliative  Readmission: NO  Quality Measure: CHF  VTE Prophylaxis: Mechanical  Influenza Vaccine screening completed? YES  Pneumococcal Vaccine screening completed? NO  Mobility needs: Yes   Nutrition plan:Yes  Consults:Case Management    Financial concerns:Yes  Escalated to CM? YES  RRAT Score: 14   Interventions:Palliative Care   Testing due for pt today? NO  LOS: 1 days Expected length of stay ? days  Discharge plan: palliative   PCP: Other, Phys, MD  Transportation needs: Yes    Days before discharge:ready for discharge  Discharge disposition: palliative    Signed:     Arnetha Massy, RN  05/26/2017  5:47 AM

## 2017-05-26 NOTE — Progress Notes (Signed)
Problem: Ventilator Management  Goal: *Adequate oxygenation and ventilation  Outcome: Progressing Towards Goal  Goal: *Patient maintains clear airway/free of aspiration  Outcome: Progressing Towards Goal  Goal: *Absence of infection signs and symptoms  Outcome: Progressing Towards Goal  Goal: *Normal spontaneous ventilation  Outcome: Progressing Towards Goal     Problem: Patient Education: Go to Patient Education Activity  Goal: Patient/Family Education  Outcome: Progressing Towards Goal     Problem: Falls - Risk of  Goal: *Absence of Falls  Description  Document Bridgette Habermann Fall Risk and appropriate interventions in the flowsheet.  Outcome: Progressing Towards Goal     Problem: Patient Education: Go to Patient Education Activity  Goal: Patient/Family Education  Outcome: Progressing Towards Goal     Problem: Pressure Injury - Risk of  Goal: *Prevention of pressure injury  Description  Document Braden Scale and appropriate interventions in the flowsheet.  Outcome: Progressing Towards Goal     Problem: Patient Education: Go to Patient Education Activity  Goal: Patient/Family Education  Outcome: Progressing Towards Goal     Problem: Infection - Risk of, Urinary Catheter-Associated Urinary Tract Infection  Goal: *Absence of infection signs and symptoms  Outcome: Progressing Towards Goal     Problem: Patient Education: Go to Patient Education Activity  Goal: Patient/Family Education  Outcome: Progressing Towards Goal     Problem: Heart Failure: Discharge Outcomes  Goal: *Demonstrates ability to perform prescribed activity without shortness of breath or discomfort  Outcome: Progressing Towards Goal  Goal: *Left ventricular function assessment completed prior to or during stay, or planned for post-discharge  Outcome: Progressing Towards Goal  Goal: *ACEI prescribed if LVEF less than 40% and no contraindications or ARB prescribed  Outcome: Progressing Towards Goal  Goal: *Verbalizes understanding and describes prescribed diet   Outcome: Progressing Towards Goal  Goal: *Verbalizes understanding/describes prescribed medications  Outcome: Progressing Towards Goal  Goal: *Describes available resources and support systems  Description  (eg: Home Health, Palliative Care, Advanced Medical Directive)  Outcome: Progressing Towards Goal  Goal: *Describes smoking cessation resources  Outcome: Progressing Towards Goal  Goal: *Understands and describes signs and symptoms to report to providers(Stroke Metric)  Outcome: Progressing Towards Goal  Goal: *Describes/verbalizes understanding of follow-up/return appt  Description  (eg: to physicians, diabetes treatment coordinator, and other resources  Outcome: Progressing Towards Goal  Goal: *Describes importance of continuing daily weights and changes to report to physician  Outcome: Progressing Towards Goal     Problem: Infection - Risk of, Ventilator-Associated Pneumonia  Goal: *Absence of infection signs and symptoms  Outcome: Progressing Towards Goal     Problem: Patient Education: Go to Patient Education Activity  Goal: Patient/Family Education  Outcome: Progressing Towards Goal     Problem: Pain  Goal: *Control of Pain  Outcome: Progressing Towards Goal  Goal: *PALLIATIVE CARE:  Alleviation of Pain  Outcome: Progressing Towards Goal     Problem: Patient Education: Go to Patient Education Activity  Goal: Patient/Family Education  Outcome: Progressing Towards Goal     Problem: Arrhythmia Pathway (Adult)  Goal: *Absence of arrhythmia  Outcome: Progressing Towards Goal     Problem: Patient Education: Go to Patient Education Activity  Goal: Patient/Family Education  Outcome: Progressing Towards Goal

## 2017-05-26 NOTE — Progress Notes (Signed)
Day #2 of Vancomycin  Indication:  empiric abx post arrest  Current regimen:  vanc  q24h  Abx regimen:  vanc/pip-tazo  ID Following ?: NO  Concomitant nephrotoxic drugs (requires more frequent monitoring): Vasopressors  Frequency of BMP?: daily    Recent Labs     05/26/17  0353 04/30/2017  1416   WBC 14.2* 9.5   CREA 1.84* 1.58*   BUN 44* 37*     Est CrCl: 20-25 ml/min; UO: <0.5 ml/kg/hr  Temp (24hrs), Avg:98.7 ??F (37.1 ??C), Min:96.9 ??F (36.1 ??C), Max:100.8 ??F (38.2 ??C)    Cultures:   4/28 blood: NG x1 day    Goal trough = 15 - 20 mcg/mL    Recent trough history (date/time/level/dose/action taken):  N/a - new start    Plan: Continue current regimen. Will assess dosing with a level once at steady state or sooner if clinically indicated.    Valentina Lucks, [place]  [id number]

## 2017-05-27 LAB — EKG, 12 LEAD, INITIAL
Atrial Rate: 38 {beats}/min
Calculated R Axis: -46 degrees
Calculated T Axis: -166 degrees
Q-T Interval: 434 ms
QRS Duration: 118 ms
QTC Calculation (Bezet): 516 ms
Ventricular Rate: 85 {beats}/min

## 2017-05-27 LAB — POC G3 - PUL
Base deficit (POC): 7 mmol/L
FIO2 (POC): 0.5 %
HCO3 (POC): 20 MMOL/L — ABNORMAL LOW (ref 22–26)
PEEP/CPAP (POC): 7 cmH2O
Set Rate: 14 {beats}/min
Tidal volume: 520 ml
pCO2 (POC): 43.9 MMHG (ref 35.0–45.0)
pH (POC): 7.267 — ABNORMAL LOW (ref 7.35–7.45)
pO2 (POC): 98 MMHG (ref 80–100)
sO2 (POC): 96 % (ref 92–97)

## 2017-05-27 MED ORDER — SCOPOLAMINE (1.3-1.5) MG 72 HR TRANSDERM PATCH
1 mg over 3 days | TRANSDERMAL | Status: DC
Start: 2017-05-27 — End: 2017-05-27

## 2017-05-27 MED FILL — GLYCOPYRROLATE 0.2 MG/ML IJ SOLN: 0.2 mg/mL | INTRAMUSCULAR | Qty: 1

## 2017-05-27 MED FILL — MORPHINE 2 MG/ML INJECTION: 2 mg/mL | INTRAMUSCULAR | Qty: 1

## 2017-05-27 MED FILL — NORMAL SALINE FLUSH 0.9 % INJECTION SYRINGE: INTRAMUSCULAR | Qty: 20

## 2017-05-27 MED FILL — NORMAL SALINE FLUSH 0.9 % INJECTION SYRINGE: INTRAMUSCULAR | Qty: 10

## 2017-05-27 NOTE — Hospice (Signed)
Referral was not received in que, hospice liaison is aware, thank you.

## 2017-05-27 NOTE — Progress Notes (Signed)
Visited in response to notification of pt's death. Pt's daughter and granddaughter present. One of the pt's sons is expected to arrive within the hour. Listened to the daughter and granddaughter speak of his life and their respective relationships with him. Chaplains will continue to offer support as needed.  Clancy Gourd, Chaplain, MDiv, MS, [facility]  [phone] [phone])

## 2017-05-27 NOTE — Discharge Summary (Signed)
Discharge Summary       PATIENT ID: Dylan Haney  MRN: [id number]   DATE OF BIRTH: 20-Aug-1924    DATE OF ADMISSION: 06-Jun-2017  1:22 PM    DATE OF DISCHARGE: 05/28/2017   PRIMARY CARE PROVIDER: Sol Blazing, MD     ATTENDING PHYSICIAN: Doreen Salvage, MD  DISCHARGING PROVIDER: Doreen Salvage, MD    To contact this individual call [phone] and ask the operator to page.  If unavailable ask to be transferred the Adult Hospitalist Department.    CONSULTATIONS: IP CONSULT TO CARDIOLOGY    PROCEDURES/SURGERIES: * No surgery found *    ADMITTING DIAGNOSES & HOSPITAL COURSE:   Dylan Haney is a 84 y.o. male with pmh of Alcohol abuse, systolic CHF (EF 08%), Afib, prior MI, who presented to [facility] from free standing ER [facility] after cardiac arrest. Reported to have been with his brother in the car, and he was not feeling well. No reports of chest pain per grandson who is at bedside. Pt is a DNR as confirmed by mPOA and duaghter, but was unfortunately resuscitated given there was no documentation with them. He underwent multiple rounds of CPR, and had downtime of about 15 min with x 4 doses of epinephrine reported before ROSC.   On arrival had discussion with family, who given patient's prior wishes and prolonged down time requested that there would be no escalation of care, and DNR (no CPR or shocks) in the event that he passed away. The following day his daughter was able to arrive from out of town, and after an additional discussion with attending decided on compassionate extubation.       DISCHARGE DIAGNOSES / PLAN:      Cardiac arrest - PEA unclear initial cause ? Flash pulmonary edemea? Family declined autopsy  - Intubated and sedated- Consult PCCM   - CT head no acute findings, CXR pulm edema/some effusions.  - Add Zosyn and vancomycin - Blood cultures- Trend lactate  - Cardiology following, declined ICE  - family don't want escalation of care- no pressors/CPR     Acute hypoxic respiratory failure - s/p intubation in the field, PCCM initially followed for vent management     Sepsis with shock - likely as a result of aspiration, likely post cardiac arrest.   - Abx initially, now transitioned to comfort measures.   - now off sedation  ??  Afib: s/p PPM- monitor  EtoH abuse: - CIWA, PRN lorazepam  Systolic CHF (last EF 35% in 2018), TTE initially ordered, now off. Cardiology consulted on admission, no signed off.    PHYSICAL EXAMINATION AT DISCHARGE:  See death discharge note:  Visit Vitals  BP (!) 77/52   Pulse 80   Temp 97.5 ??F (36.4 ??C)   Resp 19   Ht  (1.702 m)   Wt 64.8 kg (142 lb 13.7 oz)   SpO2 91%   BMI 22.37 kg/m??       Gen: unresponsive  HEENT: pupils fixed and dilated  CV: Absent heart sounds  Resp: absent respirations  MSK: no spont movement      CHRONIC MEDICAL DIAGNOSES:  Problem List as of 05/26/2017 Date Reviewed: 11/01/16          Codes Class Noted - Resolved    Cardiac arrest [facility]) ICD-10-CM: I46.9  ICD-9-CM: 427.5  06/06/2017 - Present        Syncope and collapse ICD-10-CM: R55  ICD-9-CM: 780.2  11/01/16 - Present  Frequent falls ICD-10-CM: R29.6  ICD-9-CM: V15.88  10/20/2016 - Present        Healthcare maintenance ICD-10-CM: Z00.00  ICD-9-CM: V70.0  04/27/2013 - Present    Overview Signed 04/27/2013 10:02 AM by Zebedee Iba V     PSA:  2.2. 2014    Colonoscopy:  never  FOBT:  declined  TDAP:  2011  Pneumovax:  2013  PCV-13:  2015  Flu shot:  2014  Zostavax:  eclined 2015  Eye exam:  2014  EKG:  2014    FTF for DME:  done:  [facility] but he no longer uses it  Advanced directive:  Full code  Specialists:      Data reviewed or ordered today:  Driving eval  Cards [facility]  Driving evaluation 1308             Advance directive discussed with patient ICD-10-CM: Z71.89  ICD-9-CM: V65.49  02/02/2013 - Present    Overview Signed 02/02/2013  9:54 AM by Jennye Boroughs     Full code as of 2014             Health care maintenance ICD-10-CM: Z00.00   ICD-9-CM: V70.0  12/01/2012 - Present    Overview Signed 12/01/2012  5:14 PM by Jennye Boroughs     Wellness:  Health Maintenance    Male:  PSA:  2.2 2014    Colonoscopy:  Never had and not interested  FOBT:  Not indicated  Glaucoma screen:  2014  Tdap:  2011  Pneumovax:  2013  Flu shot:  2014  Zostavax:  done  EKG documentation: on file  Advanced Directives:  discussed fll code  Face to face meeting to document need for DME, home health, therapy:  done: none, needs PT for strength  Updated specialist:  done, see problem list    Update PMH:  done    Review med list:  done             Cardiomyopathy [facility]) ICD-10-CM: I42.9  ICD-9-CM: 425.4  09/11/2012 - Present    Overview Addendum 06/01/2014  2:08 PM by Shelda Altes, MD     Dr. Elmon Kirschner  A.  Echo (09/11/12):  EF 35-40% with mod GHK.  Mildly dil RV.  Mod dil RA/LA.  Mild MR.  Mod-severe TR.  Mod PR.  PASP 60.  B.  Lexiscan Cardiolite (09/12/12):  Normal perfusion, EF 43% with mod inf HK and septal AK vs PSM.  CLynder Parents Cardiolite (04/15/14):  Normal perfusion.  EF 39% with PSM and GHK.             NASH (nonalcoholic steatohepatitis) ICD-10-CM: K75.81  ICD-9-CM: 571.8  12/19/2011 - Present    Overview Signed 12/19/2011  4:30 PM by Jennye Boroughs     [place] 2013             Macrocytosis ICD-10-CM: D75.89  ICD-9-CM: 289.89  12/11/2011 - Present    Overview Signed 12/11/2011  2:23 PM by Zebedee Iba V     B12 and folate normal  Uses ETOH             Elevated bilirubin ICD-10-CM: R17  ICD-9-CM: 277.4  11/28/2011 - Present    Overview Addendum 12/01/2012  4:53 PM by Jennye Boroughs     He could have Gilbert's syndrome although he uses ETOH daily               Unspecified vitamin D deficiency ICD-10-CM: E55.9  ICD-9-CM: 268.9  11/28/2011 - Present    Overview Signed 11/28/2011  2:54 PM by Jennye Boroughs     Needs Rx             Falls ICD-10-CM: [phone].Lorne Skeens  ICD-9-CM: E888.9  11/27/2011 - Present    Overview Signed 11/27/2011  1:41 PM by Jennye Boroughs      Head CT:  No Intracranial Disease Evident on Head CT.               ETOH abuse ICD-10-CM: F10.10  ICD-9-CM: 305.00  11/27/2011 - Present        Atrial fibrillation (HCC) ICD-10-CM: I48.91  ICD-9-CM: 427.31  11/27/2011 - Present        RESOLVED: Syncope ICD-10-CM: R55  ICD-9-CM: 780.2  09/10/2012 - 10/13/2012              Greater than 30 minutes were spent with the patient on counseling and coordination of care    Signed:   Doreen Salvage, MD  05-30-17  12:52 PM

## 2017-05-27 NOTE — Progress Notes (Signed)
Death Note      Events prior to patient???s death:  Called to bedside at 12:43 PM for unresponsive and pulseless patient.     On exam, no heart sounds or breath sounds were noted after 1 minute of auscultation.   Pupils were fixed and dilated without pupillary light reflex.     Patient was pronounced dead on 15-Jun-2017  at 12:20.  Date/Time of death: 06-15-2017 at 12:20.    Cause:  Immediate: Aspiration pneumonia     Underlying cause:   Hospital Problems  Date Reviewed: 2016-11-08          Codes Class Noted POA    Cardiac arrest [facility]) ICD-10-CM: I46.9  ICD-9-CM: 427.5  06-13-2017 Unknown        Syncope and collapse ICD-10-CM: R55  ICD-9-CM: 780.2  2016/11/08 Yes        Cardiomyopathy (HCC) ICD-10-CM: I42.9  ICD-9-CM: 425.4  09/11/2012 Yes    Overview Addendum 06/01/2014  2:08 PM by Shelda Altes, MD     Dr. Elmon Kirschner  A.  Echo (09/11/12):  EF 35-40% with mod GHK.  Mildly dil RV.  Mod dil RA/LA.  Mild MR.  Mod-severe TR.  Mod PR.  PASP 60.  B.  Lexiscan Cardiolite (09/12/12):  Normal perfusion, EF 43% with mod inf HK and septal AK vs PSM.  CLynder Parents Cardiolite (04/15/14):  Normal perfusion.  EF 39% with PSM and GHK.             NASH (nonalcoholic steatohepatitis) ICD-10-CM: K75.81  ICD-9-CM: 571.8  12/19/2011 Yes    Overview Signed 12/19/2011  4:30 PM by Jennye Boroughs     [place] 2013             ETOH abuse ICD-10-CM: F10.10  ICD-9-CM: 305.00  11/27/2011 Yes        Atrial fibrillation (HCC) ICD-10-CM: I48.91  ICD-9-CM: 427.31  11/27/2011 Yes                Physician Pronouncing Death: Doreen Salvage, MD      Attending Physician of Record:   Tamsen Meek I*     Events related to death:  Was code called: NO  Coroner notified: YES  Family notified: YES  Autopsy requested: NO  Death certificate completed: NO  Code Status Prior to Death: DNR      Discharge summary and death certificate will be completed by: Tamsen Meek I*

## 2017-05-29 DEATH — deceased

## 2017-05-30 LAB — CULTURE, BLOOD, PAIRED: Culture result:: NO GROWTH

## 2017-12-10 ENCOUNTER — Inpatient Hospital Stay: Admission: RE | Admit: 2017-12-10 | Payer: Medicare HMO | Source: Ambulatory Visit

## 2018-06-29 IMAGING — MR MR HEAD WO/W CM
13 series · 48 of 48 positions shown · IV contrast (multihance)
Comparison: None.

CLINICAL DATA: Seizure like activity, intermittently, over the past
8 months.

EXAM:
MRI HEAD WITHOUT AND WITH CONTRAST
TECHNIQUE: Multiplanar, multiecho pulse sequences of the brain and surrounding
structures were obtained without and with intravenous contrast.
CONTRAST:  13mL MULTIHANCE GADOBENATE DIMEGLUMINE 529 MG/ML IV SOLN

[Series 2: t1_se_sag · sagittal · 5.0mm · 0.45mm/px · 1 of 23 slices shown]
[im 1/23]
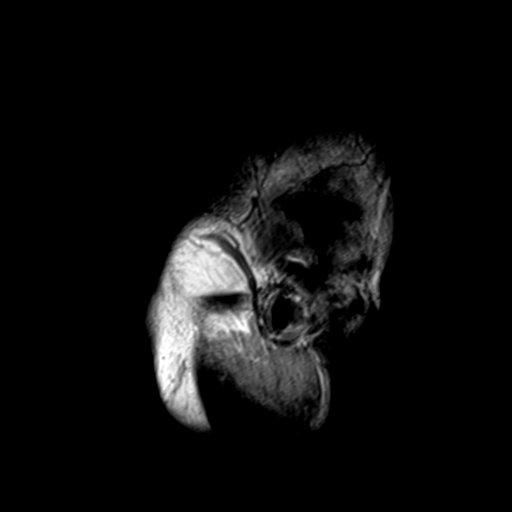

[Series 3: ep2d_diff_(id)_trace · axial · 3.0mm · 1.80mm/px · z∈[-22,+127]mm · 5 of 103 slices shown]
[im 1/103]
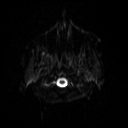
[im 26/103]
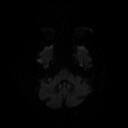
[im 52/103]
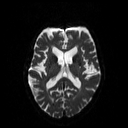
[im 77/103]
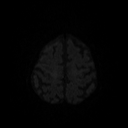
[im 103/103]
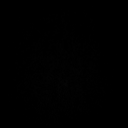

[Series 4: ep2d_diff_(id)_trace_adc · axial · 3.0mm · 1.80mm/px · z∈[-22,+127]mm · 2 of 49 slices shown]
[im 1/49]
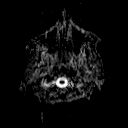
[im 49/49]
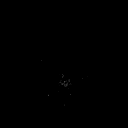

[Series 5: ep2d_diff_cor · coronal · 5.0mm · 1.77mm/px · 3 of 56 slices shown]
[im 1/56]
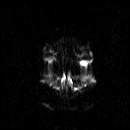
[im 28/56]
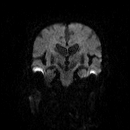
[im 56/56]
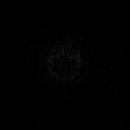

[Series 6: ep2d_diff_cor_adc · coronal · 5.0mm · 1.77mm/px · 2 of 28 slices shown]
[im 1/28]
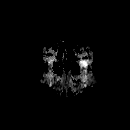
[im 28/28]
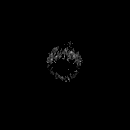

[Series 8: swi_images · axial · 2.0mm · 0.90mm/px · z∈[-27,+127]mm · 5 of 80 slices shown]
[im 1/80]
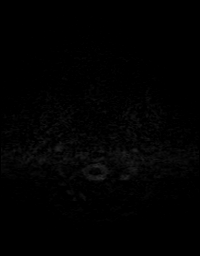
[im 20/80]
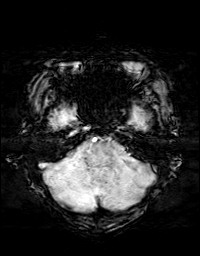
[im 40/80]
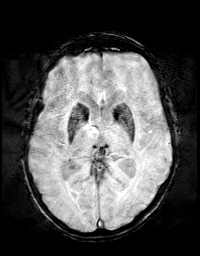
[im 60/80]
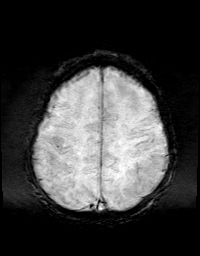
[im 80/80]
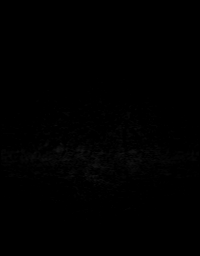

[Series 9: FLAIR · axial · 3.0mm · 0.43mm/px · z∈[-28,+124]mm · 2 of 27 slices shown]
[im 1/27]
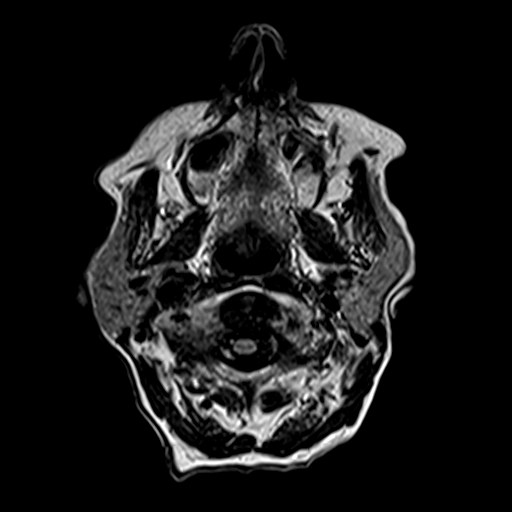
[im 27/27]
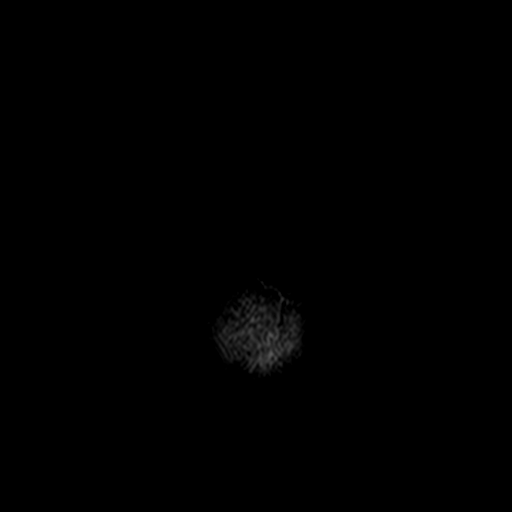

[Series 10: t2_tse_tra_512 · axial · 5.0mm · 0.60mm/px · z∈[-23,+123]mm · 2 of 26 slices shown]
[im 1/26]
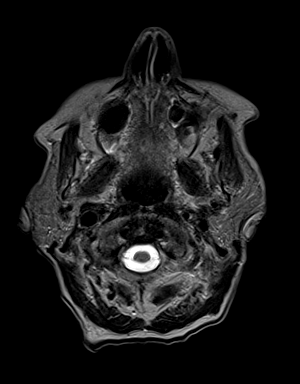
[im 26/26]
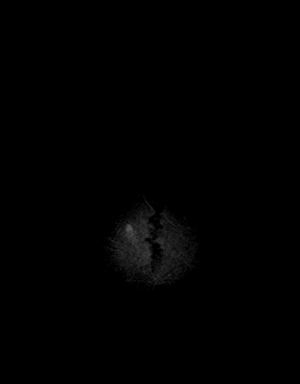

[Series 11: t1_mpr_tra · axial · 1.0mm · 0.72mm/px · z∈[-27,+127]mm · 10 of 160 slices shown]
[im 1/160]
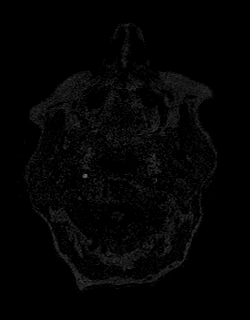
[im 18/160]
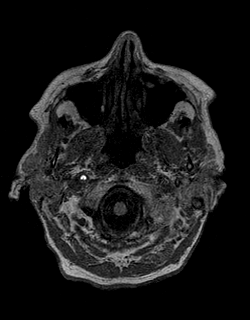
[im 36/160]
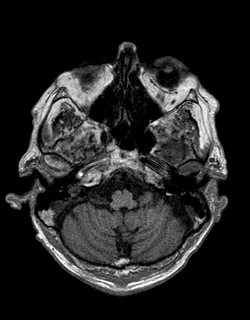
[im 54/160]
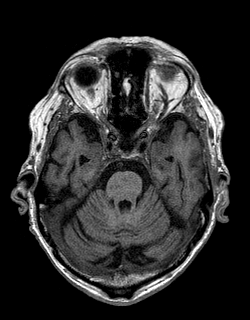
[im 71/160]
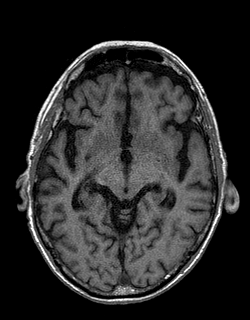
[im 89/160]
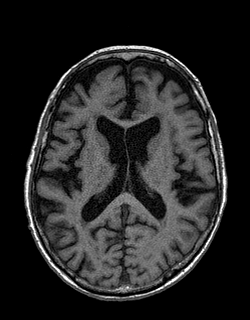
[im 107/160]
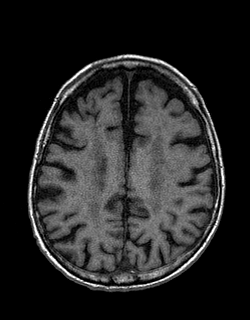
[im 124/160]
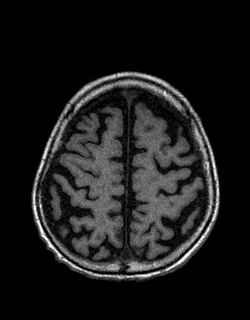
[im 142/160]
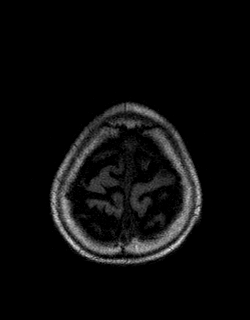
[im 160/160]
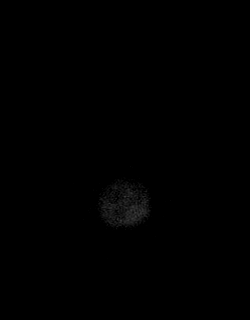

[Series 12: T2 · coronal · 3.0mm · 0.56mm/px · 2 of 31 slices shown (1 of 2)]
[im 1/31]
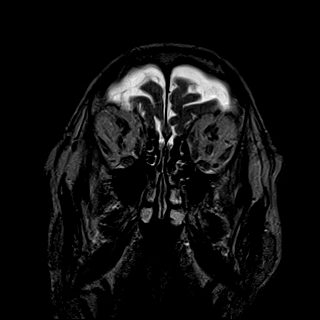
[im 31/31]
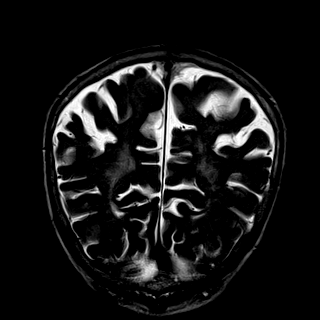

[Series 13: T2 · coronal · 5.0mm · 0.45mm/px · 2 of 28 slices shown (2 of 2)]
[im 1/28]
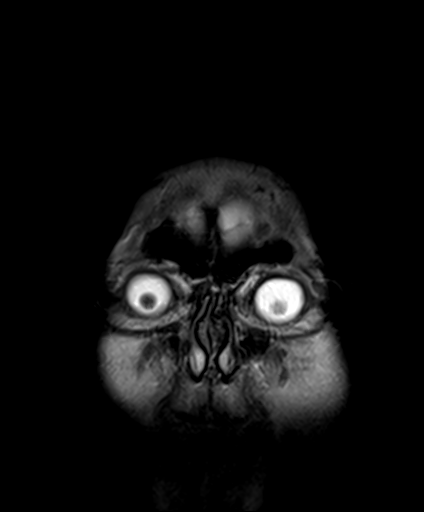
[im 28/28]
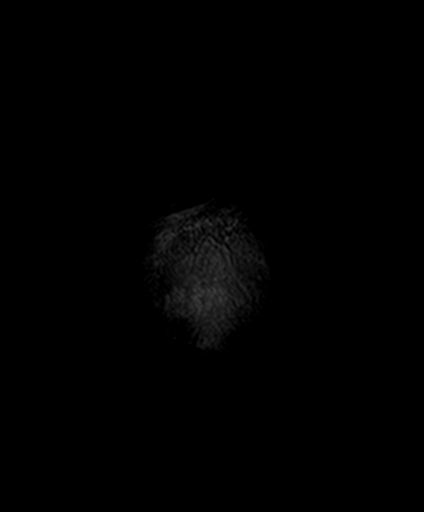

[Series 14: T1 post-contrast · coronal · 5.0mm · 0.72mm/px · 2 of 28 slices shown]
[im 1/28]
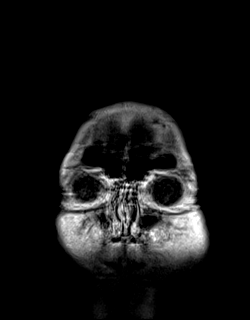
[im 28/28]
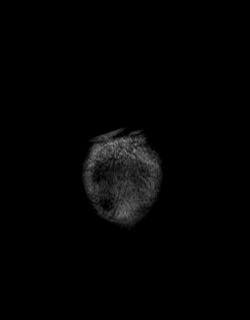

[Series 15: post t1_mpr_tra · axial · 1.0mm · 0.72mm/px · z∈[-27,+127]mm · 10 of 160 slices shown]
[im 1/160]
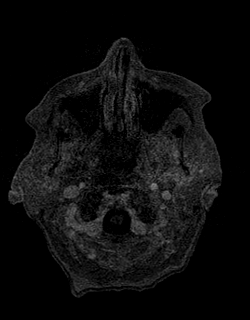
[im 18/160]
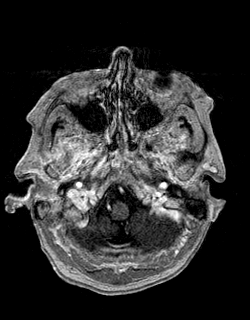
[im 36/160]
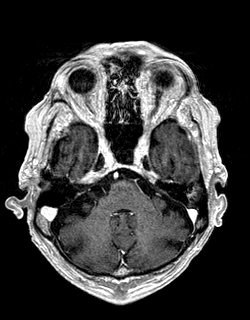
[im 54/160]
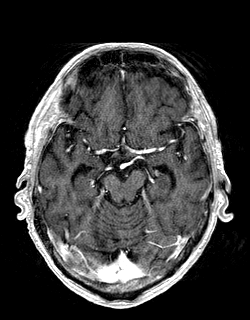
[im 71/160]
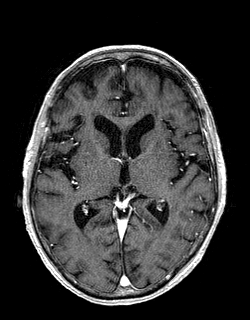
[im 89/160]
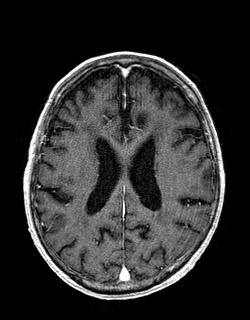
[im 107/160]
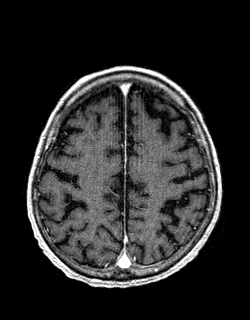
[im 124/160]
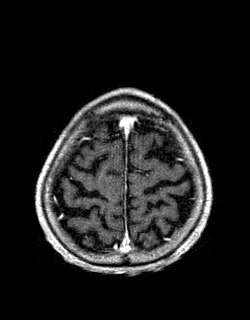
[im 142/160]
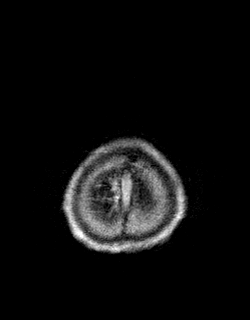
[im 160/160]
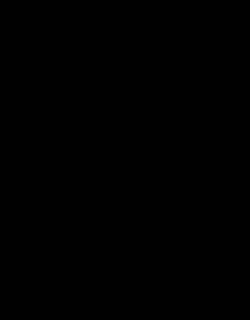

[48 of 48 positions shown; findings below may reference images not displayed]

FINDINGS: Brain: No evidence for acute infarction, hemorrhage, mass lesion,
hydrocephalus, or extra-axial fluid. Advanced cerebral and
cerebellar atrophy, not necessarily unexpected for age. Extensive T2
and FLAIR hyperintensities throughout the white matter representing
small vessel disease. Thin-section coronal imaging through the
temporal lobes demonstrates no asymmetry or mass.

Post infusion, no abnormal enhancement of the brain or meninges.

Vascular: Flow voids are maintained throughout the carotid, basilar,
and vertebral arteries. Tiny focus of chronic hemorrhage,
subcortical white matter, RIGHT occipital lobe, likely sequelae of
hypertension. No superficial siderosis to suggest an obvious
epileptiform source.

Skull and upper cervical spine: Unremarkable visualized calvarium,
skullbase, and cervical vertebrae. Pituitary, pineal, cerebellar
tonsils unremarkable. No upper cervical cord lesions.

Sinuses/Orbits: No orbital masses or proptosis. Globes appear
symmetric. Sinuses appear well aerated, without evidence for
air-fluid level.

Other: Small amount of LEFT mastoid effusion, non worrisome. Scalp
and other visualized extracranial soft tissues grossly unremarkable.
IMPRESSION: Atrophy and small vessel disease.  No acute intracranial findings.

No temporal lobe mass or inflammatory process.

No abnormal postcontrast enhancement.
